# Patient Record
Sex: Female | Born: 1955 | Race: Black or African American | Hispanic: No | Marital: Married | Smoking: Former smoker
Health system: Southern US, Community
[De-identification: ages and names within clinical notes are randomized; demographics above are authoritative.]

## PROBLEM LIST (undated history)

## (undated) DIAGNOSIS — M549 Dorsalgia, unspecified: Secondary | ICD-10-CM

## (undated) DIAGNOSIS — R140 Abdominal distension (gaseous): Secondary | ICD-10-CM

## (undated) DIAGNOSIS — H409 Unspecified glaucoma: Secondary | ICD-10-CM

## (undated) DIAGNOSIS — E049 Nontoxic goiter, unspecified: Secondary | ICD-10-CM

## (undated) DIAGNOSIS — E785 Hyperlipidemia, unspecified: Secondary | ICD-10-CM

## (undated) DIAGNOSIS — G43909 Migraine, unspecified, not intractable, without status migrainosus: Secondary | ICD-10-CM

## (undated) DIAGNOSIS — E119 Type 2 diabetes mellitus without complications: Principal | ICD-10-CM

## (undated) DIAGNOSIS — E039 Hypothyroidism, unspecified: Secondary | ICD-10-CM

## (undated) DIAGNOSIS — K635 Polyp of colon: Secondary | ICD-10-CM

## (undated) DIAGNOSIS — Z8041 Family history of malignant neoplasm of ovary: Secondary | ICD-10-CM

## (undated) DIAGNOSIS — Z800 Family history of malignant neoplasm of digestive organs: Secondary | ICD-10-CM

## (undated) DIAGNOSIS — I10 Essential (primary) hypertension: Secondary | ICD-10-CM

## (undated) DIAGNOSIS — Z8249 Family history of ischemic heart disease and other diseases of the circulatory system: Secondary | ICD-10-CM

## (undated) DIAGNOSIS — G4730 Sleep apnea, unspecified: Secondary | ICD-10-CM

## (undated) DIAGNOSIS — E669 Obesity, unspecified: Secondary | ICD-10-CM

## (undated) DIAGNOSIS — M1990 Unspecified osteoarthritis, unspecified site: Secondary | ICD-10-CM

## (undated) DIAGNOSIS — H8110 Benign paroxysmal vertigo, unspecified ear: Secondary | ICD-10-CM

## (undated) DIAGNOSIS — R232 Flushing: Secondary | ICD-10-CM

## (undated) DIAGNOSIS — C50919 Malignant neoplasm of unspecified site of unspecified female breast: Secondary | ICD-10-CM

## (undated) DIAGNOSIS — I4891 Unspecified atrial fibrillation: Secondary | ICD-10-CM

## (undated) DIAGNOSIS — G4733 Obstructive sleep apnea (adult) (pediatric): Secondary | ICD-10-CM

## (undated) DIAGNOSIS — K7689 Other specified diseases of liver: Secondary | ICD-10-CM

## (undated) DIAGNOSIS — M2550 Pain in unspecified joint: Secondary | ICD-10-CM

## (undated) DIAGNOSIS — R609 Edema, unspecified: Secondary | ICD-10-CM

## (undated) HISTORY — DX: Dorsalgia, unspecified: M54.9

## (undated) HISTORY — DX: Edema, unspecified: R60.9

## (undated) HISTORY — DX: Obstructive sleep apnea (adult) (pediatric): G47.33

## (undated) HISTORY — DX: Family history of malignant neoplasm of ovary: Z80.41

## (undated) HISTORY — DX: Unspecified glaucoma: H40.9

## (undated) HISTORY — DX: Nontoxic goiter, unspecified: E04.9

## (undated) HISTORY — DX: Obesity, unspecified: E66.9

## (undated) HISTORY — DX: Other specified diseases of liver: K76.89

## (undated) HISTORY — DX: Abdominal distension (gaseous): R14.0

## (undated) HISTORY — DX: Hyperlipidemia, unspecified: E78.5

## (undated) HISTORY — DX: Type 2 diabetes mellitus without complications: E11.9

## (undated) HISTORY — DX: Essential (primary) hypertension: I10

## (undated) HISTORY — DX: Pain in unspecified joint: M25.50

## (undated) HISTORY — DX: Family history of ischemic heart disease and other diseases of the circulatory system: Z82.49

## (undated) HISTORY — DX: Unspecified atrial fibrillation: I48.91

## (undated) HISTORY — DX: Family history of malignant neoplasm of digestive organs: Z80.0

## (undated) HISTORY — DX: Migraine, unspecified, not intractable, without status migrainosus: G43.909

## (undated) HISTORY — DX: Flushing: R23.2

## (undated) HISTORY — DX: Polyp of colon: K63.5

## (undated) HISTORY — DX: Malignant neoplasm of unspecified site of unspecified female breast: C50.919

## (undated) HISTORY — DX: Benign paroxysmal vertigo, unspecified ear: H81.10

## (undated) HISTORY — DX: Sleep apnea, unspecified: G47.30

## (undated) HISTORY — PX: BREAST LUMPECTOMY: SHX2

---

## 1998-12-29 ENCOUNTER — Other Ambulatory Visit: Admission: RE | Admit: 1998-12-29 | Discharge: 1998-12-29 | Payer: Self-pay | Admitting: Obstetrics & Gynecology

## 2000-01-08 ENCOUNTER — Other Ambulatory Visit: Admission: RE | Admit: 2000-01-08 | Discharge: 2000-01-08 | Payer: Self-pay | Admitting: Obstetrics & Gynecology

## 2000-02-13 HISTORY — PX: LIVER BIOPSY: SHX301

## 2000-08-07 ENCOUNTER — Encounter: Payer: Self-pay | Admitting: Emergency Medicine

## 2000-08-07 ENCOUNTER — Ambulatory Visit (HOSPITAL_COMMUNITY): Admission: RE | Admit: 2000-08-07 | Discharge: 2000-08-07 | Payer: Self-pay | Admitting: Emergency Medicine

## 2000-08-07 ENCOUNTER — Encounter (INDEPENDENT_AMBULATORY_CARE_PROVIDER_SITE_OTHER): Payer: Self-pay | Admitting: Specialist

## 2000-09-25 ENCOUNTER — Ambulatory Visit (HOSPITAL_COMMUNITY): Admission: RE | Admit: 2000-09-25 | Discharge: 2000-09-25 | Payer: Self-pay | Admitting: Gastroenterology

## 2000-09-25 ENCOUNTER — Encounter: Payer: Self-pay | Admitting: Gastroenterology

## 2000-10-08 ENCOUNTER — Ambulatory Visit (HOSPITAL_COMMUNITY): Admission: RE | Admit: 2000-10-08 | Discharge: 2000-10-08 | Payer: Self-pay | Admitting: Gastroenterology

## 2000-10-08 ENCOUNTER — Encounter: Payer: Self-pay | Admitting: Gastroenterology

## 2000-12-23 ENCOUNTER — Other Ambulatory Visit: Admission: RE | Admit: 2000-12-23 | Discharge: 2000-12-23 | Payer: Self-pay | Admitting: Obstetrics & Gynecology

## 2001-01-23 ENCOUNTER — Encounter: Admission: RE | Admit: 2001-01-23 | Discharge: 2001-01-23 | Payer: Self-pay | Admitting: Obstetrics & Gynecology

## 2001-01-23 ENCOUNTER — Ambulatory Visit (HOSPITAL_COMMUNITY): Admission: RE | Admit: 2001-01-23 | Discharge: 2001-01-23 | Payer: Self-pay | Admitting: Gastroenterology

## 2001-01-23 ENCOUNTER — Encounter: Payer: Self-pay | Admitting: Obstetrics & Gynecology

## 2001-01-23 ENCOUNTER — Encounter: Payer: Self-pay | Admitting: Gastroenterology

## 2001-01-23 ENCOUNTER — Ambulatory Visit (HOSPITAL_COMMUNITY): Admission: RE | Admit: 2001-01-23 | Discharge: 2001-01-23 | Payer: Self-pay | Admitting: *Deleted

## 2001-12-04 ENCOUNTER — Emergency Department (HOSPITAL_COMMUNITY): Admission: EM | Admit: 2001-12-04 | Discharge: 2001-12-04 | Payer: Self-pay | Admitting: *Deleted

## 2002-01-26 ENCOUNTER — Encounter: Admission: RE | Admit: 2002-01-26 | Discharge: 2002-01-26 | Payer: Self-pay | Admitting: Obstetrics & Gynecology

## 2002-01-26 ENCOUNTER — Encounter: Payer: Self-pay | Admitting: Obstetrics & Gynecology

## 2002-02-06 ENCOUNTER — Other Ambulatory Visit: Admission: RE | Admit: 2002-02-06 | Discharge: 2002-02-06 | Payer: Self-pay | Admitting: Obstetrics & Gynecology

## 2002-09-01 ENCOUNTER — Other Ambulatory Visit: Admission: RE | Admit: 2002-09-01 | Discharge: 2002-09-01 | Payer: Self-pay | Admitting: Obstetrics & Gynecology

## 2003-09-27 ENCOUNTER — Other Ambulatory Visit: Admission: RE | Admit: 2003-09-27 | Discharge: 2003-09-27 | Payer: Self-pay | Admitting: Obstetrics & Gynecology

## 2004-10-10 ENCOUNTER — Other Ambulatory Visit: Admission: RE | Admit: 2004-10-10 | Discharge: 2004-10-10 | Payer: Self-pay | Admitting: Obstetrics & Gynecology

## 2005-12-03 ENCOUNTER — Ambulatory Visit: Payer: Self-pay | Admitting: Gastroenterology

## 2005-12-13 DIAGNOSIS — K635 Polyp of colon: Secondary | ICD-10-CM

## 2005-12-13 HISTORY — DX: Polyp of colon: K63.5

## 2005-12-13 LAB — HM COLONOSCOPY

## 2005-12-17 ENCOUNTER — Encounter (INDEPENDENT_AMBULATORY_CARE_PROVIDER_SITE_OTHER): Payer: Self-pay | Admitting: *Deleted

## 2005-12-17 ENCOUNTER — Ambulatory Visit: Payer: Self-pay | Admitting: Gastroenterology

## 2006-10-14 DIAGNOSIS — E049 Nontoxic goiter, unspecified: Secondary | ICD-10-CM

## 2006-10-14 HISTORY — DX: Nontoxic goiter, unspecified: E04.9

## 2006-11-05 ENCOUNTER — Encounter (INDEPENDENT_AMBULATORY_CARE_PROVIDER_SITE_OTHER): Payer: Self-pay | Admitting: Interventional Radiology

## 2006-11-05 ENCOUNTER — Other Ambulatory Visit: Admission: RE | Admit: 2006-11-05 | Discharge: 2006-11-05 | Payer: Self-pay | Admitting: Interventional Radiology

## 2006-11-05 ENCOUNTER — Encounter: Admission: RE | Admit: 2006-11-05 | Discharge: 2006-11-05 | Payer: Self-pay | Admitting: Internal Medicine

## 2008-06-24 ENCOUNTER — Encounter: Admission: RE | Admit: 2008-06-24 | Discharge: 2008-06-24 | Payer: Self-pay | Admitting: Endocrinology

## 2009-08-04 ENCOUNTER — Encounter: Admission: RE | Admit: 2009-08-04 | Discharge: 2009-08-04 | Payer: Self-pay | Admitting: Emergency Medicine

## 2010-03-05 ENCOUNTER — Encounter: Payer: Self-pay | Admitting: Endocrinology

## 2010-03-07 ENCOUNTER — Other Ambulatory Visit: Payer: Self-pay | Admitting: Endocrinology

## 2010-03-07 DIAGNOSIS — E049 Nontoxic goiter, unspecified: Secondary | ICD-10-CM

## 2010-04-20 ENCOUNTER — Other Ambulatory Visit: Payer: Self-pay | Admitting: Obstetrics & Gynecology

## 2010-05-29 ENCOUNTER — Other Ambulatory Visit: Payer: Self-pay

## 2010-06-13 DIAGNOSIS — E119 Type 2 diabetes mellitus without complications: Secondary | ICD-10-CM | POA: Insufficient documentation

## 2010-06-13 HISTORY — DX: Type 2 diabetes mellitus without complications: E11.9

## 2010-09-06 ENCOUNTER — Ambulatory Visit
Admission: RE | Admit: 2010-09-06 | Discharge: 2010-09-06 | Disposition: A | Discharge status: Home / Self Care | Payer: 59 | Source: Ambulatory Visit | Attending: Endocrinology | Admitting: Endocrinology

## 2010-09-06 DIAGNOSIS — E049 Nontoxic goiter, unspecified: Secondary | ICD-10-CM

## 2010-10-10 LAB — BASIC METABOLIC PANEL: Glucose: 127 mg/dL

## 2010-10-10 LAB — HM DIABETES FOOT EXAM: HM Diabetic Foot Exam: NORMAL

## 2011-01-09 LAB — HEMOGLOBIN A1C: Hgb A1c MFr Bld: 6.7 % — AB (ref 4.0–6.0)

## 2011-01-10 LAB — LIPID PANEL
Cholesterol: 227 mg/dL — AB (ref 0–200)
HDL: 52 mg/dL (ref 35–70)
LDL Cholesterol: 101 mg/dL
LDl/HDL Ratio: 4.4
Triglycerides: 368 mg/dL — AB (ref 40–160)

## 2011-04-02 ENCOUNTER — Other Ambulatory Visit: Payer: Self-pay | Admitting: Emergency Medicine

## 2011-04-02 ENCOUNTER — Other Ambulatory Visit: Payer: Self-pay | Admitting: Physician Assistant

## 2011-04-05 ENCOUNTER — Encounter: Payer: Self-pay | Admitting: *Deleted

## 2011-04-05 DIAGNOSIS — E1169 Type 2 diabetes mellitus with other specified complication: Secondary | ICD-10-CM | POA: Insufficient documentation

## 2011-04-05 DIAGNOSIS — E119 Type 2 diabetes mellitus without complications: Secondary | ICD-10-CM

## 2011-04-05 DIAGNOSIS — E785 Hyperlipidemia, unspecified: Secondary | ICD-10-CM

## 2011-04-05 DIAGNOSIS — D649 Anemia, unspecified: Secondary | ICD-10-CM | POA: Insufficient documentation

## 2011-04-05 DIAGNOSIS — K635 Polyp of colon: Secondary | ICD-10-CM

## 2011-04-05 DIAGNOSIS — E049 Nontoxic goiter, unspecified: Secondary | ICD-10-CM

## 2011-04-05 DIAGNOSIS — G43909 Migraine, unspecified, not intractable, without status migrainosus: Secondary | ICD-10-CM | POA: Insufficient documentation

## 2011-04-05 DIAGNOSIS — I10 Essential (primary) hypertension: Secondary | ICD-10-CM | POA: Insufficient documentation

## 2011-04-05 DIAGNOSIS — H8110 Benign paroxysmal vertigo, unspecified ear: Secondary | ICD-10-CM | POA: Insufficient documentation

## 2011-04-17 ENCOUNTER — Encounter: Payer: Self-pay | Admitting: Emergency Medicine

## 2011-04-17 ENCOUNTER — Ambulatory Visit (INDEPENDENT_AMBULATORY_CARE_PROVIDER_SITE_OTHER): Payer: 59 | Admitting: Emergency Medicine

## 2011-04-17 DIAGNOSIS — E785 Hyperlipidemia, unspecified: Secondary | ICD-10-CM

## 2011-04-17 DIAGNOSIS — E782 Mixed hyperlipidemia: Secondary | ICD-10-CM

## 2011-04-17 DIAGNOSIS — E119 Type 2 diabetes mellitus without complications: Secondary | ICD-10-CM

## 2011-04-17 LAB — GLUCOSE, POCT (MANUAL RESULT ENTRY): POC Glucose: 100

## 2011-04-17 LAB — POCT GLYCOSYLATED HEMOGLOBIN (HGB A1C): Hemoglobin A1C: 6.6

## 2011-04-17 MED ORDER — METFORMIN HCL ER 500 MG PO TB24: ORAL_TABLET | ORAL | Status: DC

## 2011-04-17 NOTE — Progress Notes (Signed)
  Subjective:    Patient ID: Jessica Mendoza, female    DOB: [birth year], 56 y.o.   MRN: [id number]  HPI patient here to followup on her diabetes. She overall is doing well. She says she has lost weight. She is exercising on a regular basis. She's had some swelling in her lower extremities but no other symptoms.    Review of Systems  Constitutional: Negative.   HENT: Negative.   Eyes: Negative.   Respiratory: Negative.   Cardiovascular: Negative.   Gastrointestinal:       When she takes her metformin she has some GI discomfort.       Objective:   Physical Exam  Constitutional: She appears well-developed and well-nourished.  HENT:  Head: Normocephalic.  Eyes: Pupils are equal, round, and reactive to light.  Neck: No JVD present. No tracheal deviation present. Thyromegaly present.  Cardiovascular: Normal rate, regular rhythm and normal heart sounds.   Pulmonary/Chest: Effort normal and breath sounds normal. She has no wheezes. She has no rales. She exhibits no tenderness.  Abdominal: Soft. There is no tenderness.  Musculoskeletal: She exhibits edema.  Lymphadenopathy:    She has no cervical adenopathy.          Assessment & Plan:  Assessment is diabetes not cholesterol. She is having some GI symptoms related to her metformin. We'll consider changing her to the extended release form of metformin and see if this is easier on her stomach.

## 2011-04-18 ENCOUNTER — Encounter: Payer: Self-pay | Admitting: Family Medicine

## 2011-04-18 LAB — LIPID PANEL
Cholesterol: 187 mg/dL (ref 0–200)
HDL: 47 mg/dL (ref >39)
LDL Cholesterol: 110 mg/dL — ABNORMAL HIGH (ref 0–99)
Total CHOL/HDL Ratio: 4 {ratio}
Triglycerides: 150 mg/dL — ABNORMAL HIGH (ref <150)
VLDL: 30 mg/dL (ref 0–40)

## 2011-04-19 ENCOUNTER — Encounter: Payer: Self-pay | Admitting: Radiology

## 2011-04-24 ENCOUNTER — Telehealth: Payer: Self-pay

## 2011-04-24 NOTE — Telephone Encounter (Signed)
.  [facility] PT HAD LAB WORK DONE AND WOULD LIKE TO KNOW RESULTS. PLEASE CALL [phone]

## 2011-04-25 NOTE — Telephone Encounter (Signed)
Patient notified labs.

## 2011-04-25 NOTE — Telephone Encounter (Signed)
See labs. LMOM to CB 

## 2011-05-02 ENCOUNTER — Other Ambulatory Visit: Payer: Self-pay | Admitting: Radiology

## 2011-05-21 ENCOUNTER — Ambulatory Visit (INDEPENDENT_AMBULATORY_CARE_PROVIDER_SITE_OTHER): Payer: 59 | Admitting: Surgery

## 2011-05-21 ENCOUNTER — Encounter (INDEPENDENT_AMBULATORY_CARE_PROVIDER_SITE_OTHER): Payer: Self-pay | Admitting: Surgery

## 2011-05-21 VITALS — BP 122/76 | Ht 65.0 in | Wt 214.4 lb

## 2011-05-21 DIAGNOSIS — N6089 Other benign mammary dysplasias of unspecified breast: Secondary | ICD-10-CM

## 2011-05-21 DIAGNOSIS — N6099 Unspecified benign mammary dysplasia of unspecified breast: Secondary | ICD-10-CM | POA: Insufficient documentation

## 2011-05-21 NOTE — Patient Instructions (Signed)
Lumpectomy, Breast Conserving Surgery Care After Please read the instructions outlined below and refer to this sheet in the next few weeks. These discharge instructions provide you with general information on caring for yourself after you leave the hospital. Your surgeon may also give you specific instructions. While your treatment has been planned according to the most current medical practices available, unavoidable complications occasionally occur. If you have any problems or questions after discharge, please call your surgeon. Reasons for a lumpectomy: Any solid breast mass.  Grouped significant nodularity that may be confused with a solitary breast mass.  AFTER THE PROCEDURE After surgery, you will be taken to the recovery area where a nurse will watch and check your progress. Once you're awake, stable, and taking fluids well, barring other problems you will be allowed to go home.  Ice packs applied to your operative site may help with discomfort and keep the swelling down.  A small rubber drain may be placed in the incision for a couple of days to prevent a hematoma in the breast.  A pressure dressing may be applied for 24 to 48 hours to prevent bleeding.  Keep the wound dry.  You may resume a normal diet and activities as directed. Avoid strenuous activities affecting the arm on the side of the biopsy site such as tennis, swimming, heavy lifting (more than 10 pounds) or pulling.  Bruising in the breast is normal following this procedure.  Wearing a bra - even to bed - may be more comfortable and also help keep the dressing on.  Change dressings as directed.  Only take over-the-counter or prescription medicines for pain, discomfort, or fever as directed by your caregiver.  Call for your results as instructed by your surgeon. Remember it isyour responsibility to get the results of your lumpectomy if your surgeon asked you to follow-up. Do not assume everything is fine if you have not heard from  your caregiver. SEEK MEDICAL CARE IF:  There is increased bleeding (more than a small spot) from the wound.  You notice redness, swelling, or increasing pain in the wound.  Pus is coming from wound.  An unexplained oral temperature above 102 F (38.9 C) develops.  You notice a foul smell coming from the wound or dressing.  SEEK IMMEDIATE MEDICAL CARE IF:  You develop a rash.  You have difficulty breathing.  You have any allergic problems.  Document Released: 02/14/2006 Document Revised: 01/18/2011 Document Reviewed: 01/17/2007 Exit[employer] Patient Information 2012 [employer].Lumpectomy, Breast Conserving Surgery A lumpectomy is breast surgery that removes only part of the breast. Another name used may be partial mastectomy. The amount removed varies. Make sure you understand how much of your breast will be removed. Reasons for a lumpectomy:  Any solid breast mass.   Grouped significant nodularity that may be confused with a solitary breast mass.  Lumpectomy is the most common form of breast cancer surgery today. The surgeon removes the portion of your breast which contains the tumor (cancer). This is the lump. Some normal tissue around the lump is also removed to be sure that all the tumor has been removed.  If cancer cells are found in the margins where the breast tissue was removed, your surgeon will do more surgery to remove the remaining cancer tissue. This is called re-excision surgery. Radiation and/or chemotherapy treatments are often given following a lumpectomy to kill any cancer cells that could possibly remain.  REASONS YOU MAY NOT BE ABLE TO HAVE BREAST CONSERVING SURGERY:  The   tumor is located in more than one place.   Your breast is small and the tumor is large so the breast would be disfigured.   The entire tumor removal is not successful with a lumpectomy.   You cannot commit to a full course of chemotherapy, radiation therapy or are pregnant and cannot have radiation.     You have previously had radiation to the breast to treat cancer.  HOW A LUMPECTOMY IS PERFORMED If overnight nursing is not required following a biopsy, a lumpectomy can be performed as a same-day surgery. This can be done in a hospital, clinic, or surgical center. The anesthesia used will depend on your surgeon. They will discuss this with you. A general anesthetic keeps you sleeping through the procedure. LET YOUR CAREGIVERS KNOW ABOUT THE FOLLOWING:  Allergies   Medications taken including herbs, eye drops, over the counter medications, and creams.   Use of steroids (by mouth or creams)   Previous problems with anesthetics or Novocaine.   Possibility of pregnancy, if this applies   History of blood clots (thrombophlebitis)   History of bleeding or blood problems.   Previous surgery   Other health problems  BEFORE THE PROCEDURE You should be present one hour prior to your procedure unless directed otherwise.  AFTER THE PROCEDURE  After surgery, you will be taken to the recovery area where a nurse will watch and check your progress. Once you're awake, stable, and taking fluids well, barring other problems you will be allowed to go home.   Ice packs applied to your operative site may help with discomfort and keep the swelling down.   A small rubber drain may be placed in the breast for a couple of days to prevent a hematoma from developing in the breast.   A pressure dressing may be applied for 24 to 48 hours to prevent bleeding.   Keep the wound dry.   You may resume a normal diet and activities as directed. Avoid strenuous activities affecting the arm on the side of the biopsy site such as tennis, swimming, heavy lifting (more than 10 pounds) or pulling.   Bruising in the breast is normal following this procedure.   Wearing a bra - even to bed - may be more comfortable and also help keep the dressing on.   Change dressings as directed.   Only take over-the-counter or  prescription medicines for pain, discomfort, or fever as directed by your caregiver.  Call for your results as instructed by your surgeon. Remember it is your responsibility to get the results of your lumpectomy if your surgeon asked you to follow-up. Do not assume everything is fine if you have not heard from your caregiver. SEEK MEDICAL CARE IF:   There is increased bleeding (more than a small spot) from the wound.   You notice redness, swelling, or increasing pain in the wound.   Pus is coming from wound.   An unexplained oral temperature above 102 F (38.9 C) develops.   You notice a foul smell coming from the wound or dressing.  SEEK IMMEDIATE MEDICAL CARE IF:   You develop a rash.   You have difficulty breathing.   You have any allergic problems.  Document Released: 03/12/2006 Document Revised: 01/18/2011 Document Reviewed: 06/13/2006 Exit[employer] Patient Information 2012 [employer]. 

## 2011-05-21 NOTE — Progress Notes (Signed)
Patient ID: Jessica Mendoza, female   DOB: 1955/09/21, 56 y.o.   MRN: [id number]  No chief complaint on file.   HPI Jessica Mendoza is a 56 y.o. female.   HPIPatient sent at the request of Dr. Tilda Burrow do 2 calcifications noted on recent screening mammogram on the right. 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 She denies any history of breast mass, nipple discharge or change in the appearance of either breast. She does have mild breast pain bilaterally. This is not severe. This is been present for many years.  Past Medical History  Diagnosis Date  . Diabetes mellitus, type 2 06/2010  . Essential hypertension, benign   . Other and unspecified hyperlipidemia   . Migraines     menstrual  . Colon polyps 12/2005  . Anemia     iron def  . Focal nodular hyperplasia of liver     bx 2002  . Benign positional vertigo   . Goiter 10/2006    Past Surgical History  Procedure Date  . Liver biopsy 2002    Family History  Problem Relation Age of Onset  . Hypertension Mother   . Alcohol abuse Father   . Hypertension Brother   . Hyperlipidemia Brother   . Ovarian cancer Maternal Aunt   . Pancreatic cancer Maternal Grandfather   . Heart attack Mother     Social History History  Substance Use Topics  . Smoking status: Former Games developer  . Smokeless tobacco: Not on file  . Alcohol Use: Yes     occas    Allergies  Allergen Reactions  . Simvastatin     Myalgias  ?? lipitor     Current Outpatient Prescriptions  Medication Sig Dispense Refill  . amLODipine (NORVASC) 10 MG tablet Take 10 mg by mouth daily.      Marland Kitchen aspirin 81 MG tablet Take 81 mg by mouth daily.      . Coenzyme Q10 (CO Q 10) 100 MG CAPS Take by mouth.      . Fish Oil-Cholecalciferol (FISH OIL + D3 PO) Take by mouth.      . levothyroxine (SYNTHROID, LEVOTHROID) 25 MCG tablet Take 25 mcg by mouth daily.      Marland Kitchen losartan-hydrochlorothiazide (HYZAAR) 100-12.5 MG  per tablet Take 1 tablet by mouth daily.      . metFORMIN (GLUCOPHAGE XR) 500 MG 24 hr tablet Take 2 tablets daily  60 tablet  12  . metoprolol succinate (TOPROL-XL) 100 MG 24 hr tablet Take 100 mg by mouth daily. Take with or immediately following a meal.      . rosuvastatin (CRESTOR) 10 MG tablet Take 10 mg by mouth daily.      . vitamin C (ASCORBIC ACID) 500 MG tablet Take 500 mg by mouth daily.        Review of Systems Review of Systems  Constitutional: Negative for fever, chills and unexpected weight change.  HENT: Negative for hearing loss, congestion, sore throat, trouble swallowing and voice change.   Eyes: Negative for visual disturbance.  Respiratory: Negative for cough and wheezing.   Cardiovascular: Negative for chest pain, palpitations and leg swelling.  Gastrointestinal: Negative for nausea, vomiting, abdominal pain, diarrhea, constipation, blood in stool, abdominal distention and anal bleeding.  Genitourinary: Negative for hematuria, vaginal bleeding and difficulty urinating.  Musculoskeletal: Negative for arthralgias.  Skin: Negative for rash and wound.  Neurological: Negative for seizures, syncope and headaches.  Hematological: Negative for adenopathy.  Does not bruise/bleed easily.  Psychiatric/Behavioral: Negative for confusion.    Blood pressure 122/76, height 5\' 5"  (1.651 m), weight 214 lb 6.4 oz (97.251 kg).  Physical Exam Physical Exam  Constitutional: She appears well-developed and well-nourished.  HENT:  Head: Normocephalic and atraumatic.  Eyes: EOM are normal. Pupils are equal, round, and reactive to light.  Neck: Normal range of motion. Neck supple.  Cardiovascular: Normal rate and regular rhythm.   Pulmonary/Chest: Effort normal and breath sounds normal.       Right breast status post biopsy changes. No hematoma or mass noted. Right axilla normal. Left breast there is no mass lesion. Both nipples normal. Left axilla normal.  Neurological: She is alert.    Skin: Skin is warm and dry.  Psychiatric: She has a normal mood and affect. Her behavior is normal. Judgment and thought content normal.    Data Reviewed Mammograms from [facility]  cluster right breast microcalcifications upper-outer quadrant core biopsy shows atypical ductal hyperplasia. Left breast normal.  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 Pros and cons of each were discussed. Risks of each were discussed. She was to proceed with right breast needle localized lumpectomy.The procedure has been discussed with the patient. Alternatives to surgery have been discussed with the patient.  Risks of surgery include bleeding,  Infection,  Seroma formation, death,  and the need for further surgery.   The patient understands and wishes to proceed.       Alec Mcphee A. 05/21/2011, 3:24 PM

## 2011-06-15 ENCOUNTER — Ambulatory Visit (INDEPENDENT_AMBULATORY_CARE_PROVIDER_SITE_OTHER): Payer: 59 | Admitting: Surgery

## 2011-06-15 ENCOUNTER — Encounter (INDEPENDENT_AMBULATORY_CARE_PROVIDER_SITE_OTHER): Payer: Self-pay | Admitting: Surgery

## 2011-06-15 VITALS — BP 120/74 | HR 78 | Temp 97.3°F | Resp 16 | Ht 65.0 in | Wt 214.0 lb

## 2011-06-15 DIAGNOSIS — N63 Unspecified lump in unspecified breast: Secondary | ICD-10-CM

## 2011-06-15 DIAGNOSIS — N6310 Unspecified lump in the right breast, unspecified quadrant: Secondary | ICD-10-CM

## 2011-06-15 NOTE — Patient Instructions (Signed)
See you Friday.

## 2011-06-15 NOTE — Progress Notes (Signed)
Patient ID: Jessica Mendoza, female   DOB: 02/15/1955, 55 y.o.   MRN: [id number]  Chief Complaint  Patient presents with  . Pre-op Exam    Right breast sx    HPI Jessica Mendoza is a 55 y.o. female.   HPIPatient sent at the request of Dr. Cornella do 2 calcifications noted on recent screening mammogram on the right. 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 She denies any history of breast mass, nipple discharge or change in the appearance of either breast. She does have mild breast pain bilaterally. This is not severe. This is been present for many years.  Past Medical History  Diagnosis Date  . Diabetes mellitus, type 2 06/2010  . Essential hypertension, benign   . Other and unspecified hyperlipidemia   . Migraines     menstrual  . Colon polyps 12/2005  . Anemia     iron def  . Focal nodular hyperplasia of liver     bx 2002  . Benign positional vertigo   . Goiter 10/2006    Past Surgical History  Procedure Date  . Liver biopsy 2002    Family History  Problem Relation Age of Onset  . Hypertension Mother   . Heart attack Mother   . Alcohol abuse Father   . Hypertension Brother   . Hyperlipidemia Brother   . Ovarian cancer Maternal Aunt   . Cancer Maternal Aunt     Uterine cancer  . Pancreatic cancer Maternal Grandfather   . Cancer Maternal Grandfather     Pancreatic    Social History History  Substance Use Topics  . Smoking status: Former Smoker -- 25 years    Quit date: 02/13/1991  . Smokeless tobacco: Not on file  . Alcohol Use: Yes     occas    Allergies  Allergen Reactions  . Simvastatin     Myalgias  ?? lipitor     Current Outpatient Prescriptions  Medication Sig Dispense Refill  . amLODipine (NORVASC) 10 MG tablet Take 10 mg by mouth daily.      . aspirin 81 MG tablet Take 81 mg by mouth daily.      . Coenzyme Q10 (CO Q 10) 100 MG CAPS Take by mouth.      . Fish Oil-Cholecalciferol  (FISH OIL + D3 PO) Take by mouth.      . levothyroxine (SYNTHROID, LEVOTHROID) 25 MCG tablet Take 25 mcg by mouth daily.      . losartan-hydrochlorothiazide (HYZAAR) 100-12.5 MG per tablet Take 1 tablet by mouth daily.      . metFORMIN (GLUCOPHAGE XR) 500 MG 24 hr tablet Take 2 tablets daily  60 tablet  12  . metoprolol succinate (TOPROL-XL) 100 MG 24 hr tablet Take 100 mg by mouth daily. Take with or immediately following a meal.      . rosuvastatin (CRESTOR) 10 MG tablet Take 10 mg by mouth daily.      . vitamin C (ASCORBIC ACID) 500 MG tablet Take 500 mg by mouth daily.        Review of Systems Review of Systems  Constitutional: Negative for fever, chills and unexpected weight change.  HENT: Negative for hearing loss, congestion, sore throat, trouble swallowing and voice change.   Eyes: Negative for visual disturbance.  Respiratory: Negative for cough and wheezing.   Cardiovascular: Negative for chest pain, palpitations and leg swelling.  Gastrointestinal: Negative for nausea, vomiting, abdominal pain, diarrhea, constipation, blood   in stool, abdominal distention and anal bleeding.  Genitourinary: Negative for hematuria, vaginal bleeding and difficulty urinating.  Musculoskeletal: Negative for arthralgias.  Skin: Negative for rash and wound.  Neurological: Negative for seizures, syncope and headaches.  Hematological: Negative for adenopathy. Does not bruise/bleed easily.  Psychiatric/Behavioral: Negative for confusion.    Blood pressure 120/74, pulse 78, temperature 97.3 F (36.3 C), temperature source Temporal, resp. rate 16, height 5' 5" (1.651 m), weight 214 lb (97.07 kg).  Physical Exam Physical Exam  Constitutional: She appears well-developed and well-nourished.  HENT:  Head: Normocephalic and atraumatic.  Eyes: EOM are normal. Pupils are equal, round, and reactive to light.  Neck: Normal range of motion. Neck supple.  Cardiovascular: Normal rate and regular rhythm.     Pulmonary/Chest: Effort normal and breath sounds normal.       Right breast status post biopsy changes. No hematoma or mass noted. Right axilla normal. Left breast there is no mass lesion. Both nipples normal. Left axilla normal.  Neurological: She is alert.  Skin: Skin is warm and dry.  Psychiatric: She has a normal mood and affect. Her behavior is normal. Judgment and thought content normal.    Data Reviewed Mammograms from [facility]  cluster right breast microcalcifications upper-outer quadrant core biopsy shows atypical ductal hyperplasia. Left breast normal.  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 Pros and cons of each were discussed. Risks of each were discussed. She was to proceed with right breast needle localized lumpectomy.The procedure has been discussed with the patient. Alternatives to surgery have been discussed with the patient.  Risks of surgery include bleeding,  Infection,  Seroma formation, death,  and the need for further surgery.   The patient understands and wishes to proceed.       Meilin Brosh A. 06/15/2011, 12:26 PM    

## 2011-06-19 ENCOUNTER — Encounter (HOSPITAL_BASED_OUTPATIENT_CLINIC_OR_DEPARTMENT_OTHER): Payer: Self-pay | Admitting: *Deleted

## 2011-06-20 ENCOUNTER — Ambulatory Visit
Admission: RE | Admit: 2011-06-20 | Discharge: 2011-06-20 | Disposition: A | Discharge status: Home / Self Care | Payer: 59 | Source: Ambulatory Visit | Attending: Surgery | Admitting: Surgery

## 2011-06-20 ENCOUNTER — Encounter (HOSPITAL_BASED_OUTPATIENT_CLINIC_OR_DEPARTMENT_OTHER)
Admission: RE | Admit: 2011-06-20 | Discharge: 2011-06-20 | Disposition: A | Discharge status: Home / Self Care | Payer: 59 | Source: Ambulatory Visit | Attending: Surgery | Admitting: Surgery

## 2011-06-20 LAB — DIFFERENTIAL
Basophils Absolute: 0 10*3/uL (ref 0.0–0.1)
Basophils Relative: 0 % (ref 0–1)
Eosinophils Absolute: 0.2 10*3/uL (ref 0.0–0.7)
Eosinophils Relative: 4 % (ref 0–5)
Lymphocytes Relative: 49 % — ABNORMAL HIGH (ref 12–46)
Lymphs Abs: 2.4 10*3/uL (ref 0.7–4.0)
Monocytes Absolute: 0.4 10*3/uL (ref 0.1–1.0)
Monocytes Relative: 7 % (ref 3–12)
Neutro Abs: 2 10*3/uL (ref 1.7–7.7)
Neutrophils Relative %: 40 % — ABNORMAL LOW (ref 43–77)

## 2011-06-20 LAB — COMPREHENSIVE METABOLIC PANEL
ALT: 34 U/L (ref 0–35)
AST: 33 U/L (ref 0–37)
Albumin: 3.7 g/dL (ref 3.5–5.2)
Alkaline Phosphatase: 93 U/L (ref 39–117)
BUN: 11 mg/dL (ref 6–23)
CO2: 27 mEq/L (ref 19–32)
Calcium: 9.1 mg/dL (ref 8.4–10.5)
Chloride: 101 mEq/L (ref 96–112)
Creatinine, Ser: 0.81 mg/dL (ref 0.50–1.10)
GFR calc Af Amer: >90 mL/min (ref >90)
GFR calc non Af Amer: 80 mL/min — ABNORMAL LOW (ref >90)
Glucose, Bld: 129 mg/dL — ABNORMAL HIGH (ref 70–99)
Potassium: 3.5 meq/L (ref 3.5–5.1)
Sodium: 139 meq/L (ref 135–145)
Total Bilirubin: 0.2 mg/dL — ABNORMAL LOW (ref 0.3–1.2)
Total Protein: 7.3 g/dL (ref 6.0–8.3)

## 2011-06-20 LAB — CBC
HCT: 37 % (ref 36.0–46.0)
Hemoglobin: 12.5 g/dL (ref 12.0–15.0)
MCH: 27.5 pg (ref 26.0–34.0)
MCHC: 33.8 g/dL (ref 30.0–36.0)
MCV: 81.3 fL (ref 78.0–100.0)
Platelets: 231 10*3/uL (ref 150–400)
RBC: 4.55 MIL/uL (ref 3.87–5.11)
RDW: 14.7 % (ref 11.5–15.5)
WBC: 4.9 10*3/uL (ref 4.0–10.5)

## 2011-06-22 ENCOUNTER — Encounter (HOSPITAL_BASED_OUTPATIENT_CLINIC_OR_DEPARTMENT_OTHER)
Admission: RE | Disposition: A | Discharge status: Home / Self Care | Payer: Self-pay | Source: Ambulatory Visit | Attending: Surgery

## 2011-06-22 ENCOUNTER — Ambulatory Visit (HOSPITAL_BASED_OUTPATIENT_CLINIC_OR_DEPARTMENT_OTHER): Payer: 59 | Admitting: Anesthesiology

## 2011-06-22 ENCOUNTER — Ambulatory Visit (HOSPITAL_BASED_OUTPATIENT_CLINIC_OR_DEPARTMENT_OTHER)
Admission: RE | Admit: 2011-06-22 | Discharge: 2011-06-22 | Disposition: A | Discharge status: Home / Self Care | Payer: 59 | Source: Ambulatory Visit | Attending: Surgery | Admitting: Surgery

## 2011-06-22 ENCOUNTER — Encounter (HOSPITAL_BASED_OUTPATIENT_CLINIC_OR_DEPARTMENT_OTHER): Payer: Self-pay | Admitting: *Deleted

## 2011-06-22 ENCOUNTER — Encounter (HOSPITAL_BASED_OUTPATIENT_CLINIC_OR_DEPARTMENT_OTHER): Payer: Self-pay | Admitting: Anesthesiology

## 2011-06-22 DIAGNOSIS — E119 Type 2 diabetes mellitus without complications: Secondary | ICD-10-CM

## 2011-06-22 DIAGNOSIS — D649 Anemia, unspecified: Secondary | ICD-10-CM

## 2011-06-22 DIAGNOSIS — R51 Headache: Secondary | ICD-10-CM | POA: Insufficient documentation

## 2011-06-22 DIAGNOSIS — E049 Nontoxic goiter, unspecified: Secondary | ICD-10-CM

## 2011-06-22 DIAGNOSIS — H8110 Benign paroxysmal vertigo, unspecified ear: Secondary | ICD-10-CM

## 2011-06-22 DIAGNOSIS — N6099 Unspecified benign mammary dysplasia of unspecified breast: Secondary | ICD-10-CM

## 2011-06-22 DIAGNOSIS — N6089 Other benign mammary dysplasias of unspecified breast: Secondary | ICD-10-CM | POA: Insufficient documentation

## 2011-06-22 DIAGNOSIS — E785 Hyperlipidemia, unspecified: Secondary | ICD-10-CM

## 2011-06-22 DIAGNOSIS — Z01812 Encounter for preprocedural laboratory examination: Secondary | ICD-10-CM | POA: Insufficient documentation

## 2011-06-22 DIAGNOSIS — G43909 Migraine, unspecified, not intractable, without status migrainosus: Secondary | ICD-10-CM

## 2011-06-22 DIAGNOSIS — Z01810 Encounter for preprocedural cardiovascular examination: Secondary | ICD-10-CM | POA: Insufficient documentation

## 2011-06-22 DIAGNOSIS — K635 Polyp of colon: Secondary | ICD-10-CM

## 2011-06-22 DIAGNOSIS — I10 Essential (primary) hypertension: Secondary | ICD-10-CM

## 2011-06-22 DIAGNOSIS — N6029 Fibroadenosis of unspecified breast: Secondary | ICD-10-CM | POA: Insufficient documentation

## 2011-06-22 HISTORY — DX: Hypothyroidism, unspecified: E03.9

## 2011-06-22 HISTORY — DX: Unspecified osteoarthritis, unspecified site: M19.90

## 2011-06-22 LAB — GLUCOSE, CAPILLARY
Glucose-Capillary: 93 mg/dL (ref 70–99)
Glucose-Capillary: 85 mg/dL (ref 70–99)

## 2011-06-22 SURGERY — BREAST LUMPECTOMY WITH NEEDLE LOCALIZATION
Anesthesia: General | Site: Breast | Laterality: Right | Wound class: Clean

## 2011-06-22 MED ORDER — CEFAZOLIN SODIUM-DEXTROSE 2-3 GM-% IV SOLR: 2.0000 g | INTRAVENOUS | Status: DC

## 2011-06-22 MED ORDER — HYDROCODONE-ACETAMINOPHEN 5-500 MG PO TABS: 1.0000 | ORAL_TABLET | Freq: Four times a day (QID) | ORAL | Status: DC | PRN

## 2011-06-22 MED ORDER — SCOPOLAMINE 1 MG/3DAYS TD PT72
1.0000 | MEDICATED_PATCH | Freq: Once | TRANSDERMAL | Status: DC
  Administered 2011-06-22: 1.5 mg via TRANSDERMAL

## 2011-06-22 MED ORDER — MIDAZOLAM HCL 5 MG/5ML IJ SOLN
INTRAMUSCULAR | Status: DC | PRN
  Administered 2011-06-22: 2 mg via INTRAVENOUS

## 2011-06-22 MED ORDER — FENTANYL CITRATE 0.05 MG/ML IJ SOLN
INTRAMUSCULAR | Status: DC | PRN
  Administered 2011-06-22: 100 ug via INTRAVENOUS

## 2011-06-22 MED ORDER — BUPIVACAINE-EPINEPHRINE 0.25% -1:200000 IJ SOLN
INTRAMUSCULAR | Status: DC | PRN
  Administered 2011-06-22: 10 mL

## 2011-06-22 MED ORDER — FENTANYL CITRATE 0.05 MG/ML IJ SOLN: 50.0000 ug | INTRAMUSCULAR | Status: DC | PRN

## 2011-06-22 MED ORDER — PROPOFOL 10 MG/ML IV EMUL
INTRAVENOUS | Status: DC | PRN
  Administered 2011-06-22: 200 mg via INTRAVENOUS

## 2011-06-22 MED ORDER — LIDOCAINE HCL (CARDIAC) 20 MG/ML IV SOLN
INTRAVENOUS | Status: DC | PRN
  Administered 2011-06-22: 100 mg via INTRAVENOUS

## 2011-06-22 MED ORDER — EPHEDRINE SULFATE 50 MG/ML IJ SOLN
INTRAMUSCULAR | Status: DC | PRN
  Administered 2011-06-22 (×2): 10 mg via INTRAVENOUS

## 2011-06-22 MED ORDER — LACTATED RINGERS IV SOLN
INTRAVENOUS | Status: DC
  Administered 2011-06-22 (×2): via INTRAVENOUS

## 2011-06-22 MED ORDER — LORAZEPAM 2 MG/ML IJ SOLN: 1.0000 mg | Freq: Once | INTRAMUSCULAR | Status: DC | PRN

## 2011-06-22 MED ORDER — HYDROMORPHONE HCL PF 1 MG/ML IJ SOLN: 0.2500 mg | INTRAMUSCULAR | Status: DC | PRN

## 2011-06-22 MED ORDER — CHLORHEXIDINE GLUCONATE 4 % EX LIQD: 1.0000 "application " | Freq: Once | CUTANEOUS | Status: DC

## 2011-06-22 MED ORDER — MIDAZOLAM HCL 2 MG/2ML IJ SOLN: 1.0000 mg | INTRAMUSCULAR | Status: DC | PRN

## 2011-06-22 MED ORDER — ONDANSETRON HCL 4 MG/2ML IJ SOLN
INTRAMUSCULAR | Status: DC | PRN
  Administered 2011-06-22: 4 mg via INTRAVENOUS

## 2011-06-22 SURGICAL SUPPLY — 43 items
ADH SKN CLS APL DERMABOND .7 (GAUZE/BANDAGES/DRESSINGS) ×1
BLADE SURG 15 STRL LF DISP TIS (BLADE) ×1 IMPLANT
BLADE SURG 15 STRL SS (BLADE) ×2
CANISTER SUCTION 1200CC (MISCELLANEOUS) ×2 IMPLANT
CHLORAPREP W/TINT 26ML (MISCELLANEOUS) ×2 IMPLANT
CLIP TI WIDE RED SMALL 6 (CLIP) IMPLANT
CLOTH BEACON ORANGE TIMEOUT ST (SAFETY) ×2 IMPLANT
COVER MAYO STAND STRL (DRAPES) ×2 IMPLANT
COVER TABLE BACK 60X90 (DRAPES) ×2 IMPLANT
DECANTER SPIKE VIAL GLASS SM (MISCELLANEOUS) ×2 IMPLANT
DERMABOND ADVANCED (GAUZE/BANDAGES/DRESSINGS) ×1
DERMABOND ADVANCED .7 DNX12 (GAUZE/BANDAGES/DRESSINGS) ×1 IMPLANT
DEVICE DUBIN W/COMP PLATE 8390 (MISCELLANEOUS) ×2 IMPLANT
DRAPE LAPAROSCOPIC ABDOMINAL (DRAPES) IMPLANT
DRAPE PED LAPAROTOMY (DRAPES) ×2 IMPLANT
DRAPE UTILITY XL STRL (DRAPES) ×2 IMPLANT
ELECT COATED BLADE 2.86 ST (ELECTRODE) ×2 IMPLANT
ELECT REM PT RETURN 9FT ADLT (ELECTROSURGICAL) ×2
ELECTRODE REM PT RTRN 9FT ADLT (ELECTROSURGICAL) ×1 IMPLANT
GLOVE BIO SURGEON STRL SZ 6.5 (GLOVE) ×2 IMPLANT
GLOVE BIOGEL PI IND STRL 7.0 (GLOVE) ×1 IMPLANT
GLOVE BIOGEL PI IND STRL 8 (GLOVE) ×1 IMPLANT
GLOVE BIOGEL PI INDICATOR 7.0 (GLOVE) ×1
GLOVE BIOGEL PI INDICATOR 8 (GLOVE) ×1
GLOVE ECLIPSE 8.0 STRL XLNG CF (GLOVE) ×2 IMPLANT
GOWN PREVENTION PLUS XLARGE (GOWN DISPOSABLE) ×4 IMPLANT
KIT MARKER MARGIN INK (KITS) ×2 IMPLANT
NEEDLE HYPO 25X1 1.5 SAFETY (NEEDLE) ×2 IMPLANT
NS IRRIG 1000ML POUR BTL (IV SOLUTION) ×2 IMPLANT
PACK BASIN DAY SURGERY FS (CUSTOM PROCEDURE TRAY) ×2 IMPLANT
PENCIL BUTTON HOLSTER BLD 10FT (ELECTRODE) ×2 IMPLANT
SLEEVE SCD COMPRESS KNEE MED (MISCELLANEOUS) ×2 IMPLANT
SPONGE LAP 4X18 X RAY DECT (DISPOSABLE) ×2 IMPLANT
SUT MON AB 4-0 PC3 18 (SUTURE) ×2 IMPLANT
SUT SILK 2 0 SH (SUTURE) IMPLANT
SUT VIC AB 3-0 SH 27 (SUTURE) ×2
SUT VIC AB 3-0 SH 27X BRD (SUTURE) ×1 IMPLANT
SYR CONTROL 10ML LL (SYRINGE) ×2 IMPLANT
TOWEL OR 17X24 6PK STRL BLUE (TOWEL DISPOSABLE) IMPLANT
TOWEL OR NON WOVEN STRL DISP B (DISPOSABLE) ×2 IMPLANT
TUBE CONNECTING 20X1/4 (TUBING) ×2 IMPLANT
WATER STERILE IRR 1000ML POUR (IV SOLUTION) IMPLANT
YANKAUER SUCT BULB TIP NO VENT (SUCTIONS) ×2 IMPLANT

## 2011-06-22 NOTE — Anesthesia Postprocedure Evaluation (Signed)
  Anesthesia Post-op Note  Patient: Jessica Mendoza  Procedure(s) Performed: Procedure(s) (LRB): BREAST LUMPECTOMY WITH NEEDLE LOCALIZATION (Right)  Patient Location: PACU  Anesthesia Type: General  Level of Consciousness: awake  Airway and Oxygen Therapy: Patient Spontanous Breathing  Post-op Pain: mild  Post-op Assessment: Post-op Vital signs reviewed, Patient's Cardiovascular Status Stable, Respiratory Function Stable and Patent Airway  Post-op Vital Signs: stable  Complications: No apparent anesthesia complications

## 2011-06-22 NOTE — Transfer of Care (Signed)
Immediate Anesthesia Transfer of Care Note  Patient: Jessica Mendoza  Procedure(s) Performed: Procedure(s) (LRB): BREAST LUMPECTOMY WITH NEEDLE LOCALIZATION (Right)  Patient Location: PACU  Anesthesia Type: General  Level of Consciousness: sedated  Airway & Oxygen Therapy: Patient Spontanous Breathing and Patient connected to face mask oxygen  Post-op Assessment: Report given to PACU RN and Post -op Vital signs reviewed and stable  Post vital signs: Reviewed and stable  Complications: No apparent anesthesia complications

## 2011-06-22 NOTE — Op Note (Signed)
Breast Lumpectomy with Right with wire localization  Indications: This patient presents with history of a right breast mass. Given the clinical history and physical exam, along with indicated diagnostic studies, breast biopsy will be performed.  Core showed ADH.  Pre-operative Diagnosis: right breast mass / ADH  Post-operative Diagnosis:  SAME  Surgeon: Jadarrius Maselli A.   Assistants: or staff  Anesthesia: General LMA anesthesia and Local anesthesia 0.25.% bupivacaine, with epinephrine  ASA Class: 2  Procedure Details  The patient was seen in the Holding Room. Pt underwent wire localization at [facility]. The risks, benefits, complications, treatment options, and expected outcomes were discussed with the patient. The possibilities of reaction to medication, pulmonary aspiration, bleeding, infection, the need for additional procedures, failure to diagnose a condition, and creating a complication requiring transfusion or operation were discussed with the patient. The patient concurred with the proposed plan, giving informed consent. The site of surgery properly noted/marked. The patient was taken to Operating Room, identified as POLA FURNO, and the procedure verified as lumpectomy. A Time Out was held and the above information confirmed.  After induction of anesthesia, the right breast and chest were prepped and draped in standard fashion. The wire was trimmed. The lumpectomy was performed by creating an oblique incision over the upper outer quadrant of the breast.The wire and tissue around the wire were excised. Paint kit used and hemostasis was achieved with cautery. The wound was irrigated and closed with a 3-0 Vicryl subcuticular and  4-0  monocryl closure in layers.    Sterile dressings were applied. At the end of the operation, all sponge, instrument, and needle counts were correct.  Findings: grossly clear surgical margins  Estimated Blood Loss:  Minimal         Drains: none    Total IV Fluids: 500 ccmL         Specimens: breast mass             Complications:  None; patient tolerated the procedure well.         Disposition: PACU - hemodynamically stable.         Condition: Stable

## 2011-06-22 NOTE — H&P (View-Only) (Signed)
Patient ID: Jessica Mendoza, female   DOB: 1955/05/12, 56 y.o.   MRN: [id number]  Chief Complaint  Patient presents with  . Pre-op Exam    Right breast sx    HPI Jessica Mendoza is a 56 y.o. female.   HPIPatient sent at the request of Dr. Tilda Burrow do 2 calcifications noted on recent screening mammogram on the right. 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 She denies any history of breast mass, nipple discharge or change in the appearance of either breast. She does have mild breast pain bilaterally. This is not severe. This is been present for many years.  Past Medical History  Diagnosis Date  . Diabetes mellitus, type 2 06/2010  . Essential hypertension, benign   . Other and unspecified hyperlipidemia   . Migraines     menstrual  . Colon polyps 12/2005  . Anemia     iron def  . Focal nodular hyperplasia of liver     bx 2002  . Benign positional vertigo   . Goiter 10/2006    Past Surgical History  Procedure Date  . Liver biopsy 2002    Family History  Problem Relation Age of Onset  . Hypertension Mother   . Heart attack Mother   . Alcohol abuse Father   . Hypertension Brother   . Hyperlipidemia Brother   . Ovarian cancer Maternal Aunt   . Cancer Maternal Aunt     Uterine cancer  . Pancreatic cancer Maternal Grandfather   . Cancer Maternal Grandfather     Pancreatic    Social History History  Substance Use Topics  . Smoking status: Former Smoker -- 25 years    Quit date: 02/13/1991  . Smokeless tobacco: Not on file  . Alcohol Use: Yes     occas    Allergies  Allergen Reactions  . Simvastatin     Myalgias  ?? lipitor     Current Outpatient Prescriptions  Medication Sig Dispense Refill  . amLODipine (NORVASC) 10 MG tablet Take 10 mg by mouth daily.      Marland Kitchen aspirin 81 MG tablet Take 81 mg by mouth daily.      . Coenzyme Q10 (CO Q 10) 100 MG CAPS Take by mouth.      . Fish Oil-Cholecalciferol  (FISH OIL + D3 PO) Take by mouth.      . levothyroxine (SYNTHROID, LEVOTHROID) 25 MCG tablet Take 25 mcg by mouth daily.      Marland Kitchen losartan-hydrochlorothiazide (HYZAAR) 100-12.5 MG per tablet Take 1 tablet by mouth daily.      . metFORMIN (GLUCOPHAGE XR) 500 MG 24 hr tablet Take 2 tablets daily  60 tablet  12  . metoprolol succinate (TOPROL-XL) 100 MG 24 hr tablet Take 100 mg by mouth daily. Take with or immediately following a meal.      . rosuvastatin (CRESTOR) 10 MG tablet Take 10 mg by mouth daily.      . vitamin C (ASCORBIC ACID) 500 MG tablet Take 500 mg by mouth daily.        Review of Systems Review of Systems  Constitutional: Negative for fever, chills and unexpected weight change.  HENT: Negative for hearing loss, congestion, sore throat, trouble swallowing and voice change.   Eyes: Negative for visual disturbance.  Respiratory: Negative for cough and wheezing.   Cardiovascular: Negative for chest pain, palpitations and leg swelling.  Gastrointestinal: Negative for nausea, vomiting, abdominal pain, diarrhea, constipation, blood  in stool, abdominal distention and anal bleeding.  Genitourinary: Negative for hematuria, vaginal bleeding and difficulty urinating.  Musculoskeletal: Negative for arthralgias.  Skin: Negative for rash and wound.  Neurological: Negative for seizures, syncope and headaches.  Hematological: Negative for adenopathy. Does not bruise/bleed easily.  Psychiatric/Behavioral: Negative for confusion.    Blood pressure 120/74, pulse 78, temperature 97.3 F (36.3 C), temperature source Temporal, resp. rate 16, height 5\' 5"  (1.651 m), weight 214 lb (97.07 kg).  Physical Exam Physical Exam  Constitutional: She appears well-developed and well-nourished.  HENT:  Head: Normocephalic and atraumatic.  Eyes: EOM are normal. Pupils are equal, round, and reactive to light.  Neck: Normal range of motion. Neck supple.  Cardiovascular: Normal rate and regular rhythm.     Pulmonary/Chest: Effort normal and breath sounds normal.       Right breast status post biopsy changes. No hematoma or mass noted. Right axilla normal. Left breast there is no mass lesion. Both nipples normal. Left axilla normal.  Neurological: She is alert.  Skin: Skin is warm and dry.  Psychiatric: She has a normal mood and affect. Her behavior is normal. Judgment and thought content normal.    Data Reviewed Mammograms from [facility]  cluster right breast microcalcifications upper-outer quadrant core biopsy shows atypical ductal hyperplasia. Left breast normal.  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 Pros and cons of each were discussed. Risks of each were discussed. She was to proceed with right breast needle localized lumpectomy.The procedure has been discussed with the patient. Alternatives to surgery have been discussed with the patient.  Risks of surgery include bleeding,  Infection,  Seroma formation, death,  and the need for further surgery.   The patient understands and wishes to proceed.       Rozell Kettlewell A. 06/15/2011, 12:26 PM

## 2011-06-22 NOTE — Anesthesia Procedure Notes (Signed)
Procedure Name: LMA Insertion Date/Time: 06/22/2011 1:23 PM Performed by: Burna Cash Pre-anesthesia Checklist: Patient identified, Emergency Drugs available, Suction available and Patient being monitored Patient Re-evaluated:Patient Re-evaluated prior to inductionOxygen Delivery Method: Circle System Utilized Preoxygenation: Pre-oxygenation with 100% oxygen Intubation Type: IV induction Ventilation: Mask ventilation without difficulty LMA: LMA inserted LMA Size: 4.0 Number of attempts: 1 Airway Equipment and Method: bite block Placement Confirmation: positive ETCO2 Tube secured with: Tape Dental Injury: Teeth and Oropharynx as per pre-operative assessment

## 2011-06-22 NOTE — Interval H&P Note (Signed)
History and Physical Interval Note:  06/22/2011 12:45 PM  Jessica Mendoza  has presented today for surgery, with the diagnosis of Right breast lesion  The various methods of treatment have been discussed with the patient and family. After consideration of risks, benefits and other options for treatment, the patient has consented to  Procedure(s) (LRB): BREAST LUMPECTOMY WITH NEEDLE LOCALIZATION (Right) as a surgical intervention .  The patients' history has been reviewed, patient examined, no change in status, stable for surgery.  I have reviewed the patients' chart and labs.  Questions were answered to the patient's satisfaction.     [name] A.

## 2011-06-22 NOTE — Anesthesia Preprocedure Evaluation (Signed)
Anesthesia Evaluation  Patient identified by MRN, date of birth, ID band Patient awake    Reviewed: Allergy & Precautions, H&P , NPO status , Patient's Chart, lab work & pertinent test results  Airway Mallampati: II TM Distance: >3 FB Neck ROM: Full    Dental   Pulmonary    Pulmonary exam normal       Cardiovascular hypertension,     Neuro/Psych  Headaches,    GI/Hepatic   Endo/Other  Diabetes mellitus-Hypothyroidism   Renal/GU      Musculoskeletal   Abdominal (+) + obese,   Peds  Hematology   Anesthesia Other Findings   Reproductive/Obstetrics                           Anesthesia Physical Anesthesia Plan  ASA: III  Anesthesia Plan: General   Post-op Pain Management:    Induction: Intravenous  Airway Management Planned: LMA  Additional Equipment:   Intra-op Plan:   Post-operative Plan: Extubation in OR  Informed Consent: I have reviewed the patients History and Physical, chart, labs and discussed the procedure including the risks, benefits and alternatives for the proposed anesthesia with the patient or authorized representative who has indicated his/her understanding and acceptance.     Plan Discussed with: CRNA and Surgeon  Anesthesia Plan Comments:         Anesthesia Quick Evaluation

## 2011-06-22 NOTE — Discharge Instructions (Signed)
°Post Anesthesia Home Care Instructions ° °Activity: °Get plenty of rest for the remainder of the day. A responsible adult should stay with you for 24 hours following the procedure.  °For the next 24 hours, DO NOT: °-Drive a car °-Operate machinery °-Drink alcoholic beverages °-Take any medication unless instructed by your physician °-Make any legal decisions or sign important papers. ° °Meals: °Start with liquid foods such as gelatin or soup. Progress to regular foods as tolerated. Avoid greasy, spicy, heavy foods. If nausea and/or vomiting occur, drink only clear liquids until the nausea and/or vomiting subsides. Call your physician if vomiting continues. ° °Special Instructions/Symptoms: °Your throat may feel dry or sore from the anesthesia or the breathing tube placed in your throat during surgery. If this causes discomfort, gargle with warm salt water. The discomfort should disappear within 24 hours. ° ° ° ° ° ° ° °[facility] °Office Phone Number [phone] ° °BREAST BIOPSY/ PARTIAL MASTECTOMY: POST OP INSTRUCTIONS ° °Always review your discharge instruction sheet given to you by the facility where your surgery was performed. ° °IF YOU HAVE DISABILITY OR FAMILY LEAVE FORMS, YOU MUST BRING THEM TO THE OFFICE FOR PROCESSING.  DO NOT GIVE THEM TO YOUR DOCTOR. ° °1. A prescription for pain medication may be given to you upon discharge.  Take your pain medication as prescribed, if needed.  If narcotic pain medicine is not needed, then you may take acetaminophen (Tylenol) or ibuprofen (Advil) as needed. °2. Take your usually prescribed medications unless otherwise directed °3. If you need a refill on your pain medication, please contact your pharmacy.  They will contact our office to request authorization.  Prescriptions will not be filled after 5pm or on week-ends. °4. You should eat very light the first 24 hours after surgery, such as soup, crackers, pudding, etc.  Resume your normal diet the  day after surgery. °5. Most patients will experience some swelling and bruising in the breast.  Ice packs and a good support bra will help.  Swelling and bruising can take several days to resolve.  °6. It is common to experience some constipation if taking pain medication after surgery.  Increasing fluid intake and taking a stool softener will usually help or prevent this problem from occurring.  A mild laxative (Milk of Magnesia or Miralax) should be taken according to package directions if there are no bowel movements after 48 hours. °7. Unless discharge instructions indicate otherwise, you may remove your bandages 24-48 hours after surgery, and you may shower at that time.  You may have steri-strips (small skin tapes) in place directly over the incision.  These strips should be left on the skin for 7-10 days.  If your surgeon used skin glue on the incision, you may shower in 24 hours.  The glue will flake off over the next 2-3 weeks.  Any sutures or staples will be removed at the office during your follow-up visit. °8. ACTIVITIES:  You may resume regular daily activities (gradually increasing) beginning the next day.  Wearing a good support bra or sports bra minimizes pain and swelling.  You may have sexual intercourse when it is comfortable. °a. You may drive when you no longer are taking prescription pain medication, you can comfortably wear a seatbelt, and you can safely maneuver your car and apply brakes. °b. RETURN TO WORK:  ______________________________________________________________________________________ °9. You should see your doctor in the office for a follow-up appointment approximately two weeks after your surgery.  Your doctor’s nurse will   typically make your follow-up appointment when she calls you with your pathology report.  Expect your pathology report 2-3 business days after your surgery.  You may call to check if you do not hear from us after three days. °10. OTHER INSTRUCTIONS:  _______________________________________________________________________________________________ _____________________________________________________________________________________________________________________________________ °_____________________________________________________________________________________________________________________________________ °_____________________________________________________________________________________________________________________________________ ° °WHEN TO CALL YOUR DOCTOR: °1. Fever over 101.0 °2. Nausea and/or vomiting. °3. Extreme swelling or bruising. °4. Continued bleeding from incision. °5. Increased pain, redness, or drainage from the incision. ° °The clinic staff is available to answer your questions during regular business hours.  Please don’t hesitate to call and ask to speak to one of the nurses for clinical concerns.  If you have a medical emergency, go to the nearest emergency room or call 911.  A surgeon from [facility] is always on call at the hospital. ° °For further questions, please visit [email or web address]  °

## 2011-06-27 ENCOUNTER — Encounter (INDEPENDENT_AMBULATORY_CARE_PROVIDER_SITE_OTHER): Payer: Self-pay

## 2011-06-28 ENCOUNTER — Encounter (HOSPITAL_BASED_OUTPATIENT_CLINIC_OR_DEPARTMENT_OTHER): Payer: Self-pay

## 2011-06-29 ENCOUNTER — Encounter (INDEPENDENT_AMBULATORY_CARE_PROVIDER_SITE_OTHER): Payer: Self-pay | Admitting: Surgery

## 2011-06-29 ENCOUNTER — Ambulatory Visit (INDEPENDENT_AMBULATORY_CARE_PROVIDER_SITE_OTHER): Payer: 59 | Admitting: Surgery

## 2011-06-29 VITALS — BP 128/82 | HR 76 | Temp 97.2°F | Resp 16 | Ht 65.0 in | Wt 214.2 lb

## 2011-06-29 DIAGNOSIS — Z9889 Other specified postprocedural states: Secondary | ICD-10-CM

## 2011-06-29 NOTE — Progress Notes (Signed)
Patient returns after right breast needle local lumpectomy. Pathology showed atypical ductal hyperplasia. There is no atypia.  On exam incision clean dry and intact right breast. No redness.  Impression: Right breast atypical ductal hyperplasia after lumpectomy  Plan: Refer to high risk clinic for evaluation with Dr. Welton Flakes. As needed followup

## 2011-06-29 NOTE — Patient Instructions (Signed)
Refer to oncologist due to   Atypical ductal hyperplasia.

## 2011-07-02 ENCOUNTER — Other Ambulatory Visit: Payer: Self-pay | Admitting: Oncology

## 2011-07-02 NOTE — Progress Notes (Signed)
In the next 4-5 weeks on Tuesday mornings

## 2011-07-04 ENCOUNTER — Telehealth: Payer: Self-pay | Admitting: *Deleted

## 2011-07-04 NOTE — Telephone Encounter (Signed)
spoke with patient on 07-04-2011 she confirmed over the phone the new date and time of the high risk clinic appointment on 07-18-2011 at 1:00pm

## 2011-07-05 ENCOUNTER — Encounter (INDEPENDENT_AMBULATORY_CARE_PROVIDER_SITE_OTHER): Payer: Self-pay

## 2011-07-17 ENCOUNTER — Ambulatory Visit (INDEPENDENT_AMBULATORY_CARE_PROVIDER_SITE_OTHER): Payer: 59 | Admitting: Emergency Medicine

## 2011-07-17 VITALS — BP 106/67 | HR 67 | Temp 98.0°F | Resp 18 | Ht 65.5 in | Wt 212.4 lb

## 2011-07-17 DIAGNOSIS — E119 Type 2 diabetes mellitus without complications: Secondary | ICD-10-CM

## 2011-07-17 DIAGNOSIS — N6089 Other benign mammary dysplasias of unspecified breast: Secondary | ICD-10-CM

## 2011-07-17 DIAGNOSIS — Z13820 Encounter for screening for osteoporosis: Secondary | ICD-10-CM

## 2011-07-17 DIAGNOSIS — E782 Mixed hyperlipidemia: Secondary | ICD-10-CM

## 2011-07-17 DIAGNOSIS — E039 Hypothyroidism, unspecified: Secondary | ICD-10-CM | POA: Insufficient documentation

## 2011-07-17 LAB — LIPID PANEL
Cholesterol: 176 mg/dL (ref 0–200)
HDL: 52 mg/dL (ref >39)
LDL Cholesterol: 97 mg/dL (ref 0–99)
Total CHOL/HDL Ratio: 3.4 Ratio
Triglycerides: 137 mg/dL (ref <150)
VLDL: 27 mg/dL (ref 0–40)

## 2011-07-17 LAB — POCT GLYCOSYLATED HEMOGLOBIN (HGB A1C): Hemoglobin A1C: 6.2

## 2011-07-17 LAB — GLUCOSE, POCT (MANUAL RESULT ENTRY): POC Glucose: 120 mg/dL — AB (ref 70–99)

## 2011-07-17 NOTE — Progress Notes (Signed)
  Subjective:    Patient ID: Jessica Mendoza, female    DOB: March 18, 1955, 56 y.o.   MRN: [id number]  HPI patient in for her three-month check. She has had some problems recently with an abnormal mammogram. This disclosed ductal hyperplasia with dysplasia but no evidence of cancer. She had surgery on this area. She is scheduled to see the oncologist tomorrow. Overall her diabetes she's been doing well taking her medicine and her watching her diet.    Review of Systems no chest pain shortness of breath or other complaints     Objective:   Physical Exam HEENT exam is unremarkable the neck is supple. Chest is clear to auscultation and percussion. Cardiac exam is unremarkable.    Results for orders placed in visit on 07/17/11  POCT GLYCOSYLATED HEMOGLOBIN (HGB A1C)      Component Value Range   Hemoglobin A1C 6.2    GLUCOSE, POCT (MANUAL RESULT ENTRY)      Component Value Range   POC Glucose 120 (*) 70 - 99 (mg/dl)     Assessment & Plan:  Diabetes mellitus is at goal. She will proceed to see the oncologist tomorrow and decide on the next step for her abnormal breast biopsy.

## 2011-07-18 ENCOUNTER — Encounter: Payer: Self-pay | Admitting: Family

## 2011-07-18 ENCOUNTER — Telehealth: Payer: Self-pay | Admitting: Oncology

## 2011-07-18 ENCOUNTER — Ambulatory Visit (HOSPITAL_BASED_OUTPATIENT_CLINIC_OR_DEPARTMENT_OTHER): Payer: 59 | Admitting: Family

## 2011-07-18 VITALS — BP 129/83 | HR 75 | Temp 97.7°F | Ht 65.5 in | Wt 213.6 lb

## 2011-07-18 DIAGNOSIS — N6099 Unspecified benign mammary dysplasia of unspecified breast: Secondary | ICD-10-CM

## 2011-07-18 DIAGNOSIS — N6089 Other benign mammary dysplasias of unspecified breast: Secondary | ICD-10-CM

## 2011-07-18 LAB — TSH: TSH: 1.179 u[IU]/mL (ref 0.350–4.500)

## 2011-07-18 NOTE — Progress Notes (Signed)
[facility]  High Risk Clinic New Patient Evaluation  Name: Jessica Mendoza            Date: 07/18/2011 MRN: [id number]                DOB: 07-Feb-1956  CC: Lucilla Edin, MD, MD     REFERRING PHYSICIAN: Collene Gobble, MD   REASON FOR VISIT:Atypical ductal hyperplasia, right breast on breast biopsy. Here for recommendations regarding risk reduction.   HISTORY OF PRESENT ILLNESS: Calcifications noted on routine screening mammogram, May. Had biopsy which showed ADH, lumpectomy confirmed.   PREVIOUS BREAST/OVARIAN BIOPSIES: 06/22/11, right lumpectomy which showed atypical ductal hyperplasia with complex sclerosing lesion. All margins of excision were clear.   PREVIOUS PATHOLOGY:  ADH on needle core biopsy, which led to above lumpectomy.   PAST MEDICAL HISTORY:  has a past medical history of Diabetes mellitus, type 2 (06/2010); Essential hypertension, benign; Other and unspecified hyperlipidemia; Migraines; Colon polyps (12/2005); Focal nodular hyperplasia of liver; Benign positional vertigo; Goiter (10/2006); Hypothyroidism; and Arthritis.  PAST SURGICAL HISTORY:  Past Surgical History  Procedure Date  . Liver biopsy 2002  . Breast lumpectomy     right breast- benign      ALLERGIES: Simvastatin  SOCIAL HISTORY:  reports that she quit smoking about 20 years ago. No smokeless tobacco history on file. Drinks alcohol once or twice yearly. No illicit drugs.  HEALTH HABITS: Vitamins: D3, Vit. C Supplements: COQ-10, fish oil Alternative Therapies: None Adverse environmental exposure: None Exercises regularly:     57   Min/wk:  REPRODUCTIVE HISTORY:  Menarche age: 18 Gravida:  2     Para: 1 First Live Birth: 36 Took fertility meds: no Menopause: natural/surgical  Age 56 HRT Y/N Currently Y/N   FAMILY HISTORY:  family history includes alcohol abuse in her father; Endometrial cancer in her maternal aunt and pancreatic cancer in the maternal grandfather;  Heart attack in her mother; Hyperlipidemia in her brother; Hypertension in her brother and mother  HEALTH MAINTENANCE: Last mammogram: May 2013 Last clinical breast exam: May 2013 Performs self breast exam: yes Last Pap Smear: March 2013, normal Colonoscopy: 2010, normal  Gail Model Risk: 1.7% 5 year, 9.7% Lifetime risk.   REVIEW OF SYSTEMS:  General: Negative for fever, chills, night sweats,  loss of appetite or weight loss. HEENT: Negative for headaches, sore  throat, difficulty swallowing, blurred vision or problem with hearing or  sinus congestion. Respiratory: Negative for shortness of breath, cough  or dyspnea on exertion. Cardiovascular: Negative for chest pain,  palpitations or pedal edema. GI: Negative for nausea, vomiting,  diarrhea, constipation, change in bowel habits or blood in the stool.  No jaundice. GU: Negative for painful or frequent urination, change in  color of urine, or decreased urinary stream. Integumentary: Negative  for skin rashes or other suspicious skin lesions. Hematologic: Negative  for easy bruisability or bleeding. Musculoskeletal: Negative for  complaints of pain, arthralgias, arthritis or myalgias.  Neurological/psychiatric: Negative for numbness, focal weakness,  balance problems or coordination difficulties. No depression or mood swings.  Breast: No self detected abnormalities in the breast. No nipple discharge, masses or redness of the skin.   PHYSICAL EXAM: BP 129/83  Pulse 75  Temp(Src) 97.7 F (36.5 C) (Oral)  Ht 5' 5.5" (1.664 m)  Wt 213 lb 9.6 oz (96.888 kg)  BMI 35.00 kg/m2 GENERAL: Well developed, well nourished, in no acute distress.  EENT: No ocular or oral lesions.  No stomatitis.  RESPIRATORY: Lungs are clear to auscultation bilaterally with normal respiratory movement and no accessory muscle use. CARDIAC: No murmur, rub or tachycardia. No upper or lower extremity edema.  GI: Abdomen is soft, no palpable hepatosplenomegaly. No  fluid wave. No tenderness. Musculoskeletal: No kyphosis, no tenderness over the spine, ribs or hips. Lymph: No cervical, infraclavicular, axillary or inguinal adenopathy. Neuro: No focal neurological deficits. Psych: Alert and oriented X 3, appropriate mood and affect.  BREAST EXAM: In the supine position, with the right arm over the head, right nipple is everted. No periareolar edema or nipple discharge. No mass in any quadrant or subareolar region. At the 11 o'clock position, a 3 cm well healed scar with volume deficit underlying. No redness of the skin. No right axillary adenopathy. With the left arm over the head, left nipple is everted. No periareolar edema or nipple discharge. No mass in any quadrant or subareolar region. No redness of the skin. No left axillary adenopathy. Breasts are slightly more glandular than expected for age.    ASSESSMENT: 56 year old with:  1. History of atypical ductal hyperplasia on right breast core biopsy, confirmed on right breast lumpectomy.   PLAN:  1. Return to High Risk Clinic in 6 months for surveillance.  2. Mammogram in May 2014.   DISCUSSION:  We reviewed her pathology report and I explain atypical ductal hyperplasia. Based on 1.7% 5 year risk of developing breast cancer according to the [facility], we discussed the use of Tamoxifen for risk reduction. She had done significant reading about Tamoxifen and Raloxifene and has concerns about the reported side effects. I explain the mechanism of action and the relative risk reduction achieved with the use of Tamoxifen. She would like to discuss with her husband and revisit the subject at her next appointment. A total of 40 minutes were spent with the patient. 30 minutes of those were spent counseling on the above topics and discussing other methods of risk reduction, according to NCCN guidelines.

## 2011-07-18 NOTE — Telephone Encounter (Signed)
gve the pt her dec 2013 appt calendar °

## 2011-08-06 ENCOUNTER — Other Ambulatory Visit: Payer: Self-pay | Admitting: Emergency Medicine

## 2011-08-14 ENCOUNTER — Telehealth: Payer: Self-pay

## 2011-08-14 NOTE — Telephone Encounter (Signed)
PT STATES SHE HAVE A MAIL ORDER FOR HER METOPROLOL, BUT IT WON'T COME UNTIL THE 10TH, WOULD LIKE TO HAVE A WEEKS SUPPLY CALLED IN PLEASE CALL PT AT [phone]   [facility] ON [address]

## 2011-08-15 MED ORDER — METOPROLOL SUCCINATE ER 100 MG PO TB24: 100.0000 mg | ORAL_TABLET | Freq: Every day | ORAL | Status: DC

## 2011-08-15 NOTE — Telephone Encounter (Signed)
Rx sent to pharmacy   

## 2011-08-15 NOTE — Telephone Encounter (Signed)
Left message for her to advise Ruwayda Curet

## 2011-08-16 ENCOUNTER — Other Ambulatory Visit: Payer: Self-pay

## 2011-08-16 NOTE — Telephone Encounter (Signed)
Pt LM on nurse VM asking for refill on her medication that she is almost out of. Stated the pharmacy has requested it but have not heard back from [place]. Checked records and amlodipine and Hyzaar were sent to [facility] on 08/06/11. LMOM for pt on cell # which she left that this was done, but to call us and let her know if she needs some locally to cover her until they arrive, or if she needs another medication.

## 2011-08-16 NOTE — Telephone Encounter (Signed)
Advised pt that her Hyzaar and Amlodipine RFs were sent to [facility] on 6/24 and Metoprolol was sent to [facility] yesterday. Pt reports that [facility] does not have a record of the two RFs that we sent and she also needs the 90 day supply of metoprolol sent to [facility]. Tried to call [facility] to check on RFs and their computers were down - recording said to try back later in day. Pt also needed about 14 days of Hyzaar called to local [facility]. [facility] [address]

## 2011-08-16 NOTE — Telephone Encounter (Signed)
Tried to call [facility] again and recording still stated that they are experiencing technical difficulties - will try to CB in morning.

## 2011-08-17 MED ORDER — METOPROLOL SUCCINATE ER 100 MG PO TB24: 100.0000 mg | ORAL_TABLET | Freq: Every day | ORAL | Status: DC

## 2011-08-17 MED ORDER — AMLODIPINE BESYLATE 10 MG PO TABS: 10.0000 mg | ORAL_TABLET | Freq: Every day | ORAL | Status: DC

## 2011-08-17 MED ORDER — LOSARTAN POTASSIUM-HCTZ 100-12.5 MG PO TABS: 1.0000 | ORAL_TABLET | Freq: Every day | ORAL | Status: DC

## 2011-08-17 NOTE — Telephone Encounter (Signed)
Notified pt that all three Rxs were called into [facility] this morning. Pt stated that she thinks that she is going to opt out of mail order and just get locally bc she has been having trouble w/mail order. She will ask [facility] if they can transfer the Rxs from [facility], and will CB if she needs [place] to change them instead.

## 2011-08-17 NOTE — Telephone Encounter (Signed)
Spoke w/pharmacist at [facility] and was told that the amlodipine and hyzaar came through as denied on 6/24 for some reason. I went ahead and called in #90 + 1 RF on Amlodipine, Hyzaar and Metoprolol succinate for pt since she just had OV in June

## 2011-08-17 NOTE — Telephone Encounter (Signed)
Pt CB and stated that she had to cancel them at [facility] and asked if we could send them to [facility] instead. Sending all three Rxs to [facility].

## 2011-08-17 NOTE — Addendum Note (Signed)
Addended by: Sheppard Plumber A on: 08/17/2011 09:59 AM   Modules accepted: Orders

## 2011-10-18 ENCOUNTER — Other Ambulatory Visit: Payer: Self-pay | Admitting: Endocrinology

## 2011-10-18 DIAGNOSIS — E049 Nontoxic goiter, unspecified: Secondary | ICD-10-CM

## 2011-10-19 ENCOUNTER — Ambulatory Visit
Admission: RE | Admit: 2011-10-19 | Discharge: 2011-10-19 | Disposition: A | Discharge status: Home / Self Care | Payer: 59 | Source: Ambulatory Visit | Attending: Endocrinology | Admitting: Endocrinology

## 2011-10-19 DIAGNOSIS — E049 Nontoxic goiter, unspecified: Secondary | ICD-10-CM

## 2011-10-30 ENCOUNTER — Ambulatory Visit: Payer: 59 | Admitting: Emergency Medicine

## 2011-11-20 ENCOUNTER — Ambulatory Visit (INDEPENDENT_AMBULATORY_CARE_PROVIDER_SITE_OTHER): Payer: 59 | Admitting: Emergency Medicine

## 2011-11-20 ENCOUNTER — Encounter: Payer: Self-pay | Admitting: Emergency Medicine

## 2011-11-20 VITALS — BP 110/60 | HR 64 | Temp 98.8°F | Resp 16 | Ht 65.5 in | Wt 210.0 lb

## 2011-11-20 DIAGNOSIS — E785 Hyperlipidemia, unspecified: Secondary | ICD-10-CM

## 2011-11-20 DIAGNOSIS — E119 Type 2 diabetes mellitus without complications: Secondary | ICD-10-CM

## 2011-11-20 DIAGNOSIS — I10 Essential (primary) hypertension: Secondary | ICD-10-CM

## 2011-11-20 DIAGNOSIS — Z23 Encounter for immunization: Secondary | ICD-10-CM

## 2011-11-20 DIAGNOSIS — Z139 Encounter for screening, unspecified: Secondary | ICD-10-CM

## 2011-11-20 LAB — LIPID PANEL
Cholesterol: 193 mg/dL (ref 0–200)
HDL: 58 mg/dL (ref >39)
LDL Cholesterol: 107 mg/dL — ABNORMAL HIGH (ref 0–99)
Total CHOL/HDL Ratio: 3.3 {ratio}
Triglycerides: 140 mg/dL (ref <150)
VLDL: 28 mg/dL (ref 0–40)

## 2011-11-20 LAB — GLUCOSE, POCT (MANUAL RESULT ENTRY): POC Glucose: 97 mg/dl (ref 70–99)

## 2011-11-20 LAB — POCT GLYCOSYLATED HEMOGLOBIN (HGB A1C): Hemoglobin A1C: 6

## 2011-11-20 NOTE — Progress Notes (Signed)
  Subjective:    Patient ID: Jessica Mendoza, female    DOB: 13-Nov-1955, 56 y.o.   MRN: [id number]  HPI patient in followup for diabetes hypertension and high cholesterol. She also is on medication for hypothyroidism. She has not been following her diet as good as usual but has been taking her medications regularly. Her last hemoglobin A1c was 6.2. She wants to enter a program called [facility]. which is a aerobic exercise program.    Review of Systems     Objective:   Physical Exam HEENT is unremarkable neck supple chest clear cardiac regular rate without murmurs rubs or gallops. Extremities without edema  Results for orders placed in visit on 11/20/11  GLUCOSE, POCT (MANUAL RESULT ENTRY)      Component Value Range   POC Glucose 97  70 - 99 mg/dl  POCT GLYCOSYLATED HEMOGLOBIN (HGB A1C)      Component Value Range   Hemoglobin A1C 6.0          Assessment & Plan:  I think this program would be great for her. I will check a baseline EKG and check her routine labs today.

## 2011-12-05 ENCOUNTER — Encounter: Payer: Self-pay | Admitting: Emergency Medicine

## 2012-01-22 ENCOUNTER — Telehealth: Payer: Self-pay | Admitting: *Deleted

## 2012-01-22 ENCOUNTER — Ambulatory Visit: Payer: 59 | Admitting: Oncology

## 2012-01-22 NOTE — Telephone Encounter (Signed)
Patient called left voice message to confirm her appointment for 01-23-2012 but the patients appointment was 01-22-2012 the number the patient left was [phone] was not able to leave voice message to inform the patient of the date and time of the real appointment on 01-22-2012

## 2012-01-22 NOTE — Telephone Encounter (Signed)
Called patient back again at [phone] was able to leave voice message this time left voice message stating that the patient appointment was for 01-22-2012 at 9:00am she did not have an appointment for 01-23-2012 left name and number on the voice message for the patient to call back to [phone] and ask for dr.khan scheduler

## 2012-01-23 ENCOUNTER — Telehealth: Payer: Self-pay | Admitting: *Deleted

## 2012-01-23 ENCOUNTER — Ambulatory Visit: Payer: 59 | Admitting: Family

## 2012-01-23 NOTE — Telephone Encounter (Signed)
Sent md an message to inform the scheduling department of a new date and time that she can see this patient

## 2012-01-29 ENCOUNTER — Other Ambulatory Visit: Payer: Self-pay | Admitting: Emergency Medicine

## 2012-01-30 ENCOUNTER — Telehealth: Payer: Self-pay | Admitting: Oncology

## 2012-01-30 NOTE — Telephone Encounter (Signed)
Returned pt's call re confirming appt for 03/13/12. Per pt when she s/w Jacki Cones she was told the appt was 02/18/12. lmonvm for pt @ home and work re appt d/t for 03/13/12. Pt called back and I was able to speak with her directly regarding appt for 03/14/11 @ 9am. Pt aware I will mail schedule. Schedule mailed today.

## 2012-02-08 ENCOUNTER — Other Ambulatory Visit: Payer: Self-pay | Admitting: *Deleted

## 2012-02-08 MED ORDER — ROSUVASTATIN CALCIUM 10 MG PO TABS: 10.0000 mg | ORAL_TABLET | Freq: Every day | ORAL | Status: DC

## 2012-02-17 ENCOUNTER — Ambulatory Visit (INDEPENDENT_AMBULATORY_CARE_PROVIDER_SITE_OTHER): Payer: 59 | Admitting: Emergency Medicine

## 2012-02-17 VITALS — BP 107/69 | HR 75 | Temp 98.1°F | Resp 16 | Ht 66.0 in | Wt 211.4 lb

## 2012-02-17 DIAGNOSIS — J101 Influenza due to other identified influenza virus with other respiratory manifestations: Secondary | ICD-10-CM

## 2012-02-17 DIAGNOSIS — R509 Fever, unspecified: Secondary | ICD-10-CM

## 2012-02-17 DIAGNOSIS — J111 Influenza due to unidentified influenza virus with other respiratory manifestations: Secondary | ICD-10-CM

## 2012-02-17 DIAGNOSIS — R059 Cough, unspecified: Secondary | ICD-10-CM

## 2012-02-17 DIAGNOSIS — R05 Cough: Secondary | ICD-10-CM

## 2012-02-17 LAB — POCT INFLUENZA A/B
Influenza A, POC: POSITIVE
Influenza B, POC: NEGATIVE

## 2012-02-17 MED ORDER — OSELTAMIVIR PHOSPHATE 75 MG PO CAPS: 75.0000 mg | ORAL_CAPSULE | Freq: Two times a day (BID) | ORAL | Status: DC

## 2012-02-17 NOTE — Progress Notes (Signed)
  Subjective:    Patient ID: STANLEY HELMUTH, female    DOB: 03-06-1955, 57 y.o.   MRN: [id number]  HPI pt presents with fever, cough--non productive. Has been around people with similar sx the patient did take a flu shot. She does complain of myalgias but not severe. Her temperature has been up to 101.7 .Marland Kitchen     Review of Systems     Objective:   Physical Exam patient is alert and cooperative does not appear toxic. Her neck is supple. Her chest is clear to both auscultation and percussion. Her HEENT exam is unremarkable.        Assessment & Plan:  Maryclare Labrador go ahead and check a flu swab. Patient has typical flu symptoms

## 2012-02-17 NOTE — Patient Instructions (Addendum)

## 2012-03-13 ENCOUNTER — Ambulatory Visit (HOSPITAL_BASED_OUTPATIENT_CLINIC_OR_DEPARTMENT_OTHER): Payer: 59 | Admitting: Oncology

## 2012-03-13 VITALS — BP 114/72 | HR 71 | Temp 97.7°F | Resp 20 | Ht 66.0 in | Wt 213.8 lb

## 2012-03-13 DIAGNOSIS — N6089 Other benign mammary dysplasias of unspecified breast: Secondary | ICD-10-CM

## 2012-03-13 DIAGNOSIS — N6099 Unspecified benign mammary dysplasia of unspecified breast: Secondary | ICD-10-CM

## 2012-03-13 NOTE — Patient Instructions (Addendum)
I will see you back as needed.  

## 2012-03-14 ENCOUNTER — Telehealth: Payer: Self-pay | Admitting: Oncology

## 2012-03-18 ENCOUNTER — Ambulatory Visit (INDEPENDENT_AMBULATORY_CARE_PROVIDER_SITE_OTHER): Payer: 59 | Admitting: Emergency Medicine

## 2012-03-18 ENCOUNTER — Encounter: Payer: Self-pay | Admitting: Emergency Medicine

## 2012-03-18 VITALS — BP 115/74 | HR 70 | Temp 97.9°F | Resp 18 | Wt 213.0 lb

## 2012-03-18 DIAGNOSIS — E559 Vitamin D deficiency, unspecified: Secondary | ICD-10-CM

## 2012-03-18 DIAGNOSIS — E119 Type 2 diabetes mellitus without complications: Secondary | ICD-10-CM

## 2012-03-18 DIAGNOSIS — D649 Anemia, unspecified: Secondary | ICD-10-CM

## 2012-03-18 DIAGNOSIS — C50919 Malignant neoplasm of unspecified site of unspecified female breast: Secondary | ICD-10-CM

## 2012-03-18 DIAGNOSIS — E785 Hyperlipidemia, unspecified: Secondary | ICD-10-CM

## 2012-03-18 DIAGNOSIS — I10 Essential (primary) hypertension: Secondary | ICD-10-CM

## 2012-03-18 DIAGNOSIS — R634 Abnormal weight loss: Secondary | ICD-10-CM

## 2012-03-18 LAB — CBC
HCT: 37.5 % (ref 36.0–46.0)
Hemoglobin: 12.7 g/dL (ref 12.0–15.0)
MCH: 26.8 pg (ref 26.0–34.0)
MCHC: 33.9 g/dL (ref 30.0–36.0)
MCV: 79.3 fL (ref 78.0–100.0)
Platelets: 266 10*3/uL (ref 150–400)
RBC: 4.73 MIL/uL (ref 3.87–5.11)
RDW: 15.5 % (ref 11.5–15.5)
WBC: 5.9 10*3/uL (ref 4.0–10.5)

## 2012-03-18 LAB — POCT GLYCOSYLATED HEMOGLOBIN (HGB A1C): Hemoglobin A1C: 6

## 2012-03-18 LAB — GLUCOSE, POCT (MANUAL RESULT ENTRY): POC Glucose: 96 mg/dl (ref 70–99)

## 2012-03-18 LAB — LIPID PANEL
Cholesterol: 185 mg/dL (ref 0–200)
HDL: 49 mg/dL (ref >39)
LDL Cholesterol: 98 mg/dL (ref 0–99)
Total CHOL/HDL Ratio: 3.8 {ratio}
Triglycerides: 189 mg/dL — ABNORMAL HIGH (ref <150)
VLDL: 38 mg/dL (ref 0–40)

## 2012-03-18 MED ORDER — LOSARTAN POTASSIUM-HCTZ 100-12.5 MG PO TABS: 1.0000 | ORAL_TABLET | Freq: Every day | ORAL | Status: DC

## 2012-03-18 MED ORDER — METOPROLOL SUCCINATE ER 100 MG PO TB24: 100.0000 mg | ORAL_TABLET | Freq: Every day | ORAL | Status: DC

## 2012-03-18 MED ORDER — CHOLECALCIFEROL 1.25 MG (50000 UT) PO TABS: 50000.0000 [IU] | ORAL_TABLET | ORAL | Status: DC

## 2012-03-18 MED ORDER — ROSUVASTATIN CALCIUM 10 MG PO TABS: 10.0000 mg | ORAL_TABLET | Freq: Every day | ORAL | Status: DC

## 2012-03-18 MED ORDER — AMLODIPINE BESYLATE 10 MG PO TABS: 10.0000 mg | ORAL_TABLET | Freq: Every day | ORAL | Status: DC

## 2012-03-18 NOTE — Progress Notes (Signed)
  Subjective:    Patient ID: Jessica Mendoza, female    DOB: 1955/11/20, 57 y.o.   MRN: [id number]  HPI patient here for recheck diabetes hypertension hyperlipidemia. She also has a history of ductal hyperplasia. She has been released by Dr. Lennette Bihari and will be followed here with yearly mammograms. She joined a Investment banker, corporate at [employer]. This involves Systems analyst and works with patients on weight loss exercise and strength training    Review of Systems     Objective:   Physical Exam HEENT exam is unremarkable her neck is supple. Chest is clear heart regular rate no murmurs abdomen soft nontender liver and spleen not large extremity exam reveals pulses to be 2+ sensation normal no skin breakdown Results for orders placed in visit on 03/18/12  POCT GLYCOSYLATED HEMOGLOBIN (HGB A1C)      Component Value Range   Hemoglobin A1C 6.0    GLUCOSE, POCT (MANUAL RESULT ENTRY)      Component Value Range   POC Glucose 96  70 - 99 mg/dl         Assessment & Plan:  ` No change in medications at the present time recheck 4 months

## 2012-03-23 NOTE — Progress Notes (Signed)
[facility]  High Risk Clinic Follow-up Visit  Name: Jessica Mendoza            Date: 03/23/2012 MRN: [id number]                DOB: [birth year]  CC:   REFERRING PHYSICIAN: Dr. Maisie Fus Cornett  REASON FOR VISIT:Patient is seen in followup. Her initial visit was with Colman Cater on 07/18/2011.  HISTORY OF PRESENT ILLNESS: Jessica Mendoza is a 57 y.o. female here for follow-up of hereditary cancer risk assessment. Overall patient is doing well she is without any significant complaints. On her last visit with our nurse practitioner Colman Cater patient was recommended to proceed with chemoprevention with tamoxifen. However up today patient has not started it. And she plans on starting it at all. 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 And she does have a probable increase risk of developing a breast cancer or another high risk lesion. Certainly her risk would include atypical ductal hyperplasia ductal carcinoma in situ or even invasive ductal carcinoma.   CURRENT MEDICATIONS: Ms. Salih had no medications administered during this visit.  ALLERGIES: Simvastatin  SOCIAL HISTORY:  reports that she quit smoking about 21 years ago. She does not have any smokeless tobacco history on file. She reports that  drinks alcohol. She reports that she does not use illicit drugs.  CHANGE IN FAMILY HISTORY:    HEALTH MAINTENANCE: Last mammogram: Last clinical breast exam: Performs self breast exam: Last Pap Smear: Colonoscopy:  Last skin exam:  REVIEW OF SYSTEMS:  General: Negative for fever, chills, night sweats,  loss of appetite or weight loss. HEENT: Negative for headaches, sore  throat, difficulty swallowing, blurred  vision or problem with hearing or  sinus congestion. Respiratory: Negative for shortness of breath, cough  or dyspnea on exertion. Cardiovascular: Negative for chest pain,  palpitations or pedal edema. GI: Negative for nausea, vomiting,  diarrhea, constipation, change in bowel habits or blood in the stool.  No jaundice. GU: Negative for painful or frequent urination, change in  color of urine, or decreased urinary stream. Integumentary: Negative  for skin rashes or other suspicious skin lesions. Hematologic: Negative  for easy bruisability or bleeding. Musculoskeletal: Negative for  complaints of pain, arthralgias, arthritis or myalgias.  Neurological/psychiatric: Negative for numbness, focal weakness,  balance problems or coordination difficulties. No depression or mood swings.  Breast: No self detected abnormalities in the breast. No nipple discharge, masses or redness of the skin.   PHYSICAL EXAM: BP 114/72  Pulse 71  Temp(Src) 97.7 F (36.5 C) (Oral)  Resp 20  Ht 5\' 6"  (1.676 m)  Wt 213 lb 12.8 oz (96.979 kg)  BMI 34.52 kg/m2 GENERAL: Well developed, well nourished, in no acute distress.  EENT: No ocular or oral lesions. No stomatitis.  RESPIRATORY: Lungs are clear to auscultation bilaterally with normal respiratory movement and no accessory muscle use. CARDIAC: No murmur, rub or tachycardia. No upper or lower extremity edema.  GI: Abdomen is soft, no palpable hepatosplenomegaly. No fluid wave. No tenderness. Musculoskeletal: No kyphosis, no tenderness over the spine, ribs or hips. Lymph: No cervical, infraclavicular, axillary or inguinal adenopathy. Neuro: No focal neurological deficits. Psych: Alert and oriented X 3, appropriate mood and affect.  BREAST  EXAM: In the supine position, with the right arm over the head, right nipple is everted. No periareolar edema or nipple discharge. No mass in any quadrant or subareolar region. No redness of the skin. No right axillary  adenopathy. With the left arm over the head, left nipple is everted. No periareolar edema or nipple discharge. No mass in any quadrant or subareolar region. No redness of the skin. No left axillary adenopathy.    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 All margins were negative for disease. Postoperatively patient is doing well. On her prior visit in the high-risk clinic she was recommended chemoprevention with tamoxifen. She understands the rationale. She has done a lot of reading. However at this time patient is still not sure whether she wants to go on tamoxifen or not. She would like to have her mammograms as scheduled she will do self breast examinations as well as proceed with clinical examinations by her GYN.  PLAN:   patient will now continue to follow up with her primary care physician as well as her gynecologist. I have recommended she exercise eat a healthy and do self breast examinations. Certainly if she changes her mind about chemoprevention I. Can see her back. Otherwise I will see her on an as-needed basis.  Drue Second, MD Medical/Oncology [facility] [phone] (beeper) [phone] (Office)

## 2012-05-01 ENCOUNTER — Telehealth: Payer: Self-pay | Admitting: Radiology

## 2012-05-01 NOTE — Telephone Encounter (Signed)
Patient needed extra views after her mammogram. Called her to advise. Left message for her to call me back and advise.

## 2012-05-05 ENCOUNTER — Other Ambulatory Visit: Payer: Self-pay | Admitting: Obstetrics & Gynecology

## 2012-05-07 NOTE — Telephone Encounter (Signed)
Called again.  Left message for call back.   

## 2012-05-08 NOTE — Telephone Encounter (Signed)
Unable to reach letter sent

## 2012-05-15 ENCOUNTER — Encounter: Payer: Self-pay | Admitting: Emergency Medicine

## 2012-05-28 ENCOUNTER — Other Ambulatory Visit: Payer: Self-pay | Admitting: Emergency Medicine

## 2012-06-03 ENCOUNTER — Ambulatory Visit (INDEPENDENT_AMBULATORY_CARE_PROVIDER_SITE_OTHER): Payer: 59 | Admitting: Family Medicine

## 2012-06-03 ENCOUNTER — Ambulatory Visit: Payer: 59

## 2012-06-03 ENCOUNTER — Telehealth: Payer: Self-pay | Admitting: *Deleted

## 2012-06-03 VITALS — BP 118/64 | HR 67 | Temp 98.1°F | Resp 16 | Ht 66.0 in | Wt 210.0 lb

## 2012-06-03 DIAGNOSIS — J029 Acute pharyngitis, unspecified: Secondary | ICD-10-CM

## 2012-06-03 DIAGNOSIS — Z7901 Long term (current) use of anticoagulants: Secondary | ICD-10-CM

## 2012-06-03 DIAGNOSIS — I4891 Unspecified atrial fibrillation: Secondary | ICD-10-CM

## 2012-06-03 DIAGNOSIS — I499 Cardiac arrhythmia, unspecified: Secondary | ICD-10-CM

## 2012-06-03 LAB — BASIC METABOLIC PANEL
BUN: 12 mg/dL (ref 6–23)
CO2: 28 meq/L (ref 19–32)
Calcium: 9.5 mg/dL (ref 8.4–10.5)
Chloride: 101 mEq/L (ref 96–112)
Creat: 0.94 mg/dL (ref 0.50–1.10)
Glucose, Bld: 100 mg/dL — ABNORMAL HIGH (ref 70–99)
Potassium: 4.1 mEq/L (ref 3.5–5.3)
Sodium: 139 mEq/L (ref 135–145)

## 2012-06-03 LAB — POCT CBC
Granulocyte percent: 57.2 %G (ref 37–80)
HCT, POC: 38.2 % (ref 37.7–47.9)
Hemoglobin: 11.9 g/dL — AB (ref 12.2–16.2)
Lymph, poc: 2.3 (ref 0.6–3.4)
MCH, POC: 26.8 pg — AB (ref 27–31.2)
MCHC: 31.2 g/dL — AB (ref 31.8–35.4)
MCV: 86.1 fL (ref 80–97)
MID (cbc): 0.7 (ref 0–0.9)
MPV: 9.9 fL (ref 0–99.8)
POC Granulocyte: 4.1 (ref 2–6.9)
POC LYMPH PERCENT: 32.4 % (ref 10–50)
POC MID %: 10.4 %M (ref 0–12)
Platelet Count, POC: 244 10*3/uL (ref 142–424)
RBC: 4.44 M/uL (ref 4.04–5.48)
RDW, POC: 16.1 %
WBC: 7.1 10*3/uL (ref 4.6–10.2)

## 2012-06-03 LAB — TSH: TSH: 1.298 u[IU]/mL (ref 0.350–4.500)

## 2012-06-03 LAB — POCT RAPID STREP A (OFFICE): Rapid Strep A Screen: NEGATIVE

## 2012-06-03 MED ORDER — RIVAROXABAN 20 MG PO TABS
20.0000 mg | ORAL_TABLET | Freq: Every day | ORAL | Status: DC
Start: 2012-06-03 — End: 2012-09-04

## 2012-06-03 NOTE — Patient Instructions (Addendum)
Stop taking your aspirin for the time being.  I will call you later on today with your cardiology appt and instructions for your blood thinner. We are going to have you take xarelto assuming your renal function is ok.  You have a an upper respiratory infection- likely viral.  This should get better over the next couple of days.  Avoid taking decongestants such as sudafed.

## 2012-06-03 NOTE — Progress Notes (Signed)
[facility] [address] [place] [address] [phone]  Date:  06/03/2012   Name:  Jessica Mendoza   DOB:  05/12/1955   MRN:  [id number]  PCP:  Lucilla Edin, MD    Chief Complaint: Sore Throat and Cough   History of Present Illness:  Jessica Mendoza is a 57 y.o. very pleasant female patient who presents with the following:  She has noted a ST for about 3 days.  No fever.  She has tried salt water gargles and some other home remedies.  She also has a cough- non productive.  She is around her 47 year old grandson frequently.   She has noted mild aches and chills.  She does not note any runny, stuffy nose or itchy eyes.  She has felt nauseated, but has not had vomiting or diarrhea. She is eating normally   Most recent A1c in 2/14 was 6%.  History of HTN and NIDDM.  Also history of hypothyroidism.  She is taking metformin, norvasc/ hyzaar/ toprol, crestor and synthroid as well as asa 81 mg, fish oil and vitamins  Upon ascultation noted an irregular cardiac rhythm. She then admitted to noting some palpitations and SOB for the last month or so.   She has never been diagnosed with any cardiac issues or arrythmia in the past.  No chest pain.  Denies any history of GI bleeding or other unusual bleeding.    She is post- menopausal and states there is no change of pregnancy  Patient Active Problem List  Diagnosis  . Diabetes mellitus, type 2  . Essential hypertension, benign  . Other and unspecified hyperlipidemia  . Migraines  . Colon polyps  . Anemia  . Benign positional vertigo  . Goiter  . Atypical ductal hyperplasia of breast  . Hypothyroidism    Past Medical History  Diagnosis Date  . Diabetes mellitus, type 2 06/2010  . Essential hypertension, benign   . Other and unspecified hyperlipidemia   . Migraines     menstrual  . Colon polyps 12/2005  . Focal nodular hyperplasia of liver     bx 2002  . Benign positional vertigo   . Goiter 10/2006   . Hypothyroidism   . Arthritis     WRISTS AND NECK  . Glaucoma     Past Surgical History  Procedure Laterality Date  . Liver biopsy  2002  . Breast lumpectomy      right breast- benign    History  Substance Use Topics  . Smoking status: Former Smoker -- 25 years    Quit date: 02/13/1991  . Smokeless tobacco: Not on file  . Alcohol Use: No     Comment: occas    Family History  Problem Relation Age of Onset  . Hypertension Mother   . Heart attack Mother   . Alcohol abuse Father   . Hypertension Brother   . Hyperlipidemia Brother   . Ovarian cancer Maternal Aunt   . Cancer Maternal Aunt     Uterine cancer  . Pancreatic cancer Maternal Grandfather   . Cancer Maternal Grandfather     Pancreatic    Allergies  Allergen Reactions  . Simvastatin     Myalgias  ?? lipitor     Medication list has been reviewed and updated.  Current Outpatient Prescriptions on File Prior to Visit  Medication Sig Dispense Refill  . amLODipine (NORVASC) 10 MG tablet Take 1 tablet (10 mg total) by mouth  daily. NEED REFILLS  30 tablet  11  . aspirin 81 MG tablet Take 81 mg by mouth daily.      . Coenzyme Q10 (CO Q 10) 100 MG CAPS Take by mouth.      . Fish Oil-Cholecalciferol (FISH OIL + D3 PO) Take by mouth.      . levothyroxine (SYNTHROID, LEVOTHROID) 25 MCG tablet Take 25 mcg by mouth daily. NEED REFILLS      . losartan-hydrochlorothiazide (HYZAAR) 100-12.5 MG per tablet Take 1 tablet by mouth daily. NEED REFILLS  30 tablet  11  . metFORMIN (GLUCOPHAGE-XR) 500 MG 24 hr tablet Take 1 tablet (500 mg total) by mouth daily with breakfast. Needs office visit/labs  60 tablet  2  . metoprolol succinate (TOPROL-XL) 100 MG 24 hr tablet Take 1 tablet (100 mg total) by mouth daily. NEED REFILLS  30 tablet  11  . Multiple Vitamins-Minerals (WOMENS 50+ MULTI VITAMIN/MIN) TABS Take by mouth daily.      . Omega-3 Fatty Acids (FISH OIL) 1200 MG CAPS Take by mouth daily.      . rosuvastatin (CRESTOR) 10  MG tablet Take 1 tablet (10 mg total) by mouth daily.  30 tablet  11  . Cholecalciferol 50000 UNITS TABS Take 50,000 Int'l Units by mouth once a week.  4 tablet  11  . vitamin C (ASCORBIC ACID) 500 MG tablet Take 500 mg by mouth daily.       No current facility-administered medications on file prior to visit.    Review of Systems:  As per HPI- otherwise negative.   Physical Examination: Filed Vitals:   06/03/12 1007  BP: 118/64  Pulse: 67  Temp: 98.1 F (36.7 C)  Resp: 16   Filed Vitals:   06/03/12 1007  Height: 5\' 6"  (1.676 m)  Weight: 210 lb (95.255 kg)   Body mass index is 33.91 kg/(m^2). Ideal Body Weight: Weight in (lb) to have BMI = 25: 154.6  GEN: WDWN, NAD, Non-toxic, A & O x 3, overweight, looks well HEENT: Atraumatic, Normocephalic. Neck supple. No masses, No LAD.  Bilateral TM wnl, oropharynx minimally injected, no exudate.  Nasal cavity congested.  PEERL,EOMI.   Ears and Nose: No external deformity. CV: irregularly irregular, No M/G/R. No JVD. No thrill. No extra heart sounds. PULM: CTA B, no wheezes, crackles, rhonchi. No retractions. No resp. distress. No accessory muscle use. ABD: S, NT, ND, +BS. No rebound. No HSM. EXTR: No c/c/e NEURO Normal gait.  PSYCH: Normally interactive. Conversant. Not depressed or anxious appearing.  Calm demeanor.   EKG: atrial fibrillation with normal ventricular response.  Rate in the 70s   [facility] reading (PRIMARY) by  Dr. Patsy Lager. CXR:  Negative CHEST - 2 VIEW  Comparison: None.  Findings: Lungs clear. Heart is mildly enlarged with normal pulmonary vascularity. No adenopathy. No bone lesions. There is mild thoracolumbar levoscoliosis.  IMPRESSION: Mild cardiac enlargement. No edema or consolidation.  Called and discussed with Dr. Patty Sermons at [facility].  New onset/ first identified a- fib with normal ventricular response. CHADS2 score of 2 as she has HTN and DM.  Will check renal function and start xarelto, check her TSH.   Will plan to have her seen at [facility] cardiology as soon as possible.   Results for orders placed in visit on 06/03/12  POCT RAPID STREP A (OFFICE)      Result Value Range   Rapid Strep A Screen Negative  Negative    Assessment and Plan: Acute pharyngitis -  Plan: POCT rapid strep A  Irregular heart beat - Plan: EKG 12-Lead, DG Chest 2 View  Atrial fibrillation - Plan: Rivaroxaban (XARELTO) 20 MG TABS, POCT CBC, Basic metabolic panel, TSH, Ambulatory referral to Cardiology  Long term (current) use of anticoagulants - Plan: Protime-INR  Jessica Mendoza is seen today for a sore throat- suspect viral URI.  Discussed conservative and supportive management.  If not better in the next few days please let me know- Sooner if worse.   Incidental finding of atrial fibrillation with normal ventricular response today.  Discussed in detail with patient.  Plan to have her follow- up with cardiology- appt made to see Dr. Clide Cliff early next month.   Discussed anticoagulation to prevent stroke.  She is comfortable using xarelto- understands lack of reversibility but prefers for ease of use. Calculated creatinine clearance at 72.8 today- ok to use xarelto at usual dosage of 20 mg a day.  Stop aspirin while on xarelto.    Await other labs (TSH).  She is to report any chest pain or other issues in the meantime.     Meds ordered this encounter  Medications  . UNABLE TO FIND    Sig: 1,000 Units daily. VIT D3  . Rivaroxaban (XARELTO) 20 MG TABS    Sig: Take 1 tablet (20 mg total) by mouth daily.    Dispense:  30 tablet    Refill:  2    Signed Abbe Amsterdam, MD  Results for orders placed in visit on 06/03/12  BASIC METABOLIC PANEL      Result Value Range   Sodium 139  135 - 145 mEq/L   Potassium 4.1  3.5 - 5.3 mEq/L   Chloride 101  96 - 112 mEq/L   CO2 28  19 - 32 mEq/L   Glucose, Bld 100 (*) 70 - 99 mg/dL   BUN 12  6 - 23 mg/dL   Creat 5.28  4.13 - 2.44 mg/dL   Calcium 9.5  8.4 - 01.0 mg/dL  POCT  RAPID STREP A (OFFICE)      Result Value Range   Rapid Strep A Screen Negative  Negative  POCT CBC      Result Value Range   WBC 7.1  4.6 - 10.2 K/uL   Lymph, poc 2.3  0.6 - 3.4   POC LYMPH PERCENT 32.4  10 - 50 %L   MID (cbc) 0.7  0 - 0.9   POC MID % 10.4  0 - 12 %M   POC Granulocyte 4.1  2 - 6.9   Granulocyte percent 57.2  37 - 80 %G   RBC 4.44  4.04 - 5.48 M/uL   Hemoglobin 11.9 (*) 12.2 - 16.2 g/dL   HCT, POC 27.2  53.6 - 47.9 %   MCV 86.1  80 - 97 fL   MCH, POC 26.8 (*) 27 - 31.2 pg   MCHC 31.2 (*) 31.8 - 35.4 g/dL   RDW, POC 64.4     Platelet Count, POC 244  142 - 424 K/uL   MPV 9.9  0 - 99.8 fL

## 2012-06-03 NOTE — Telephone Encounter (Signed)
Message copied by Burnell Blanks on Tue Jun 03, 2012  6:11 PM ------      Message from: Cassell Clement      Created: Tue Jun 03, 2012  5:16 PM                   ----- Message -----         From: Pearline Cables, MD         Sent: 06/03/2012   2:32 PM           To: Cassell Clement, MD            Dear Dr. Patty Sermons,            Thanks for your help with Ms. Lafoy earlier today- it is much appreciated.  The earliest appt I was able to get for follow- up is on May 6th.  I wanted to be sure you feel this is soon enough, and ask if I should order an echo or any other preliminary testing in the meantime.  Also, it turns out she had a pre- cancerous breast lesion treated with lumpectomy but not breast cancer.            Warm regards,            Jessica Copland             ------

## 2012-06-03 NOTE — Telephone Encounter (Signed)
Discussed with  Dr. Patty Sermons and patient seeing Dr Graciela Husbands 5/6, would be a good to get echo prior to visit. Tried to call patient, no answer. Will try again

## 2012-06-04 ENCOUNTER — Encounter: Payer: Self-pay | Admitting: Family Medicine

## 2012-06-04 LAB — PROTIME-INR
INR: 0.97 (ref <1.50)
Prothrombin Time: 12.9 seconds (ref 11.6–15.2)

## 2012-06-05 NOTE — Telephone Encounter (Signed)
Spoke with Jasmine December in [facility] and patient has been scheduled for echo. Patient did reschedule her consult secondary to being out of town. Will make Dr Dallas Schimke aware of consult change.

## 2012-06-08 ENCOUNTER — Telehealth: Payer: Self-pay | Admitting: Family Medicine

## 2012-06-08 NOTE — Telephone Encounter (Signed)
Called to discuss with her- she has rescheduled her appt with cardiology. She has an echo on 06/23/12.  She is feeling well.  Asked her to let me know if any problems with palpitations, etc.  She will do so

## 2012-06-08 NOTE — Telephone Encounter (Signed)
Message copied by Pearline Cables on Sun Jun 08, 2012  3:47 PM ------      Message from: Regis Bill B      Created: Thu Jun 05, 2012  9:05 AM       Good morning Dr Dallas Schimke,             You spoke with  Dr. Patty Sermons on Tuesday regarding Avelynn Sellin and her new onset A Fib. I just wanted to make you aware that she is scheduled for an echo but did reschedule her consult. She is going to be out of town per [facility]. If you have any other questions or concerns feel free to contact us.             Regis Bill, LPN ------

## 2012-06-17 ENCOUNTER — Encounter: Payer: Self-pay | Admitting: Emergency Medicine

## 2012-06-17 ENCOUNTER — Institutional Professional Consult (permissible substitution): Payer: 59 | Admitting: Internal Medicine

## 2012-06-23 ENCOUNTER — Ambulatory Visit (HOSPITAL_COMMUNITY): Payer: 59 | Attending: Internal Medicine | Admitting: Radiology

## 2012-06-23 DIAGNOSIS — I10 Essential (primary) hypertension: Secondary | ICD-10-CM | POA: Insufficient documentation

## 2012-06-23 DIAGNOSIS — E039 Hypothyroidism, unspecified: Secondary | ICD-10-CM | POA: Insufficient documentation

## 2012-06-23 DIAGNOSIS — I4891 Unspecified atrial fibrillation: Secondary | ICD-10-CM | POA: Insufficient documentation

## 2012-06-23 DIAGNOSIS — E119 Type 2 diabetes mellitus without complications: Secondary | ICD-10-CM | POA: Insufficient documentation

## 2012-06-23 DIAGNOSIS — Z87891 Personal history of nicotine dependence: Secondary | ICD-10-CM | POA: Insufficient documentation

## 2012-06-23 NOTE — Progress Notes (Signed)
Echocardiogram performed.  

## 2012-07-02 ENCOUNTER — Telehealth: Payer: Self-pay | Admitting: Internal Medicine

## 2012-07-02 ENCOUNTER — Encounter: Payer: Self-pay | Admitting: *Deleted

## 2012-07-02 ENCOUNTER — Encounter: Payer: Self-pay | Admitting: Internal Medicine

## 2012-07-02 ENCOUNTER — Ambulatory Visit (INDEPENDENT_AMBULATORY_CARE_PROVIDER_SITE_OTHER): Payer: 59 | Admitting: Internal Medicine

## 2012-07-02 VITALS — BP 111/71 | HR 85 | Ht 65.0 in | Wt 213.0 lb

## 2012-07-02 DIAGNOSIS — G4733 Obstructive sleep apnea (adult) (pediatric): Secondary | ICD-10-CM | POA: Insufficient documentation

## 2012-07-02 DIAGNOSIS — I4891 Unspecified atrial fibrillation: Secondary | ICD-10-CM | POA: Insufficient documentation

## 2012-07-02 DIAGNOSIS — R079 Chest pain, unspecified: Secondary | ICD-10-CM

## 2012-07-02 NOTE — Telephone Encounter (Signed)
Spoke with pt, she is asking about the DCCV procedure that was scheduled for her today. She is currently driving and will call me back tomorrow after she gets home from work.

## 2012-07-02 NOTE — Assessment & Plan Note (Signed)
She is atrial fibrillation with a modest increase in heart rate associated with exercise, i.e. Leg lifts. For dyspnea on exertion chest discomfort and dizziness may also be associated with atrial fibrillation. Flutters from family history of coronary disease I worry about cooccuring coronary artery disease.every week stress testing.  As relates to atrial fibrillation, from a blood risk profile has a CHADS2 score of 2 and a CHADS-VASc score of 3. Long-term anticoagulation is indicated. This is been appropriately prescribed; she has been on Rivaroxaban now for 4 weeks. We'll undertake cardioversion. She has modest left atrial enlargement and will need to wait and see how long it is that she is able to hold sinus rhythm at if we can further implicate atrial fibrillation as the cause of her aforementioned symptoms. We discussed only briefly the issue of long-term rhythm control as an alternative to rate control. The first effort rhythm control is obviously cardioversion.  For her multiple comorbidities which may be contributory atrial fibrillation. She has symptoms consistent with sleep apnea. She will need a sleep study. She is hypertension and we'll need to continue their be for this. There is evidence of hypertensive heart disease.

## 2012-07-02 NOTE — Addendum Note (Signed)
Addended by: Lisabeth Devoid F on: 07/02/2012 04:17 PM   Modules accepted: Orders

## 2012-07-02 NOTE — Assessment & Plan Note (Signed)
As above  We'll undertake stress testing following her cardioversion.

## 2012-07-02 NOTE — Patient Instructions (Addendum)
Your physician has recommended that you have a Cardioversion (DCCV). Electrical Cardioversion uses a jolt of electricity to your heart either through paddles or wired patches attached to your chest. This is a controlled, usually prescheduled, procedure. Defibrillation is done under light anesthesia in the hospital, and you usually go home the day of the procedure. This is done to get your heart back into a normal rhythm. You are not awake for the procedure. Please see the instruction sheet given to you today.  Your physician has recommended that you have a sleep study. This test records several body functions during sleep, including: brain activity, eye movement, oxygen and carbon dioxide blood levels, heart rate and rhythm, breathing rate and rhythm, the flow of air through your mouth and nose, snoring, body muscle movements, and chest and belly movement.  Your physician has requested that you have en exercise stress myoview AFTER Cardioversion. For further information please visit [email or web address] Please follow instruction sheet, as given.   Your physician recommends that you schedule a follow-up appointment in: 2 weeks with Dr. Graciela Husbands AFTER your Cardioversion  Your physician recommends that you continue on your current medications as directed. Please refer to the Current Medication list given to you today.

## 2012-07-02 NOTE — Assessment & Plan Note (Signed)
As above.

## 2012-07-02 NOTE — Telephone Encounter (Signed)
Follow Up      Pt is following up on phone call from earlier. Please call back.

## 2012-07-02 NOTE — Progress Notes (Signed)
ELECTROPHYSIOLOGY CONSULT NOTE  Patient ID: Jessica Mendoza, MRN: [id number], DOB/AGE: 58/18/1957 57 y.o. Admit date: (Not on file) Date of Consult: 07/02/2012  Primary Physician: Lucilla Edin, MD Primary Cardiologist:   Chief Complaint: atrial fibrillation    HPI Jessica Mendoza is a 57 y.o. female   referred for treatment options related to atrial fibrillation. This was identified in the context of presenting with URI symptoms. She noted some difficulty with lightheadedness as well and exertional dyspnea. This occurred in the context of chronic edema and some effort associated chest discomfort described as a heaviness going into the jaw it was relieved by rest. She specifically had no associated palpitations   Evaluation that day she was found to be in atrial fibrillation and Rivaroxaban was started. She has tolerated this without significant problems. Beta blockers were also started and added to her complex antihypertensive regime.  Blood work obtained at that visit demonstrated a normal TSH essentially normal hemogram with a borderline low hemoglobin and normal electrolytes.  Her related medical history is notable exertional chest discomfort as noted above with a family history of her mother dying at age 3 of "heart attack" in addition she has symptoms consistent with sleep apnea with obstructive breathing patterns at night daytime fatigue and daytime somnolence.  His chronic peripheral edema without orthopnea or nocturnal dyspnea. She's had no syncope.  She's been diagnosed as diabetic. She is on therapy. She exercises 3 days a week.     Echo 2014 demonstrated normal left ventricular function mild left ventricular hypertrophy mild left atrial enlargement   Past Medical History  Diagnosis Date  . Diabetes mellitus, type 2 06/2010  . Essential hypertension, benign   . Other and unspecified hyperlipidemia   . Migraines     menstrual  . Colon polyps 12/2005  . Focal  nodular hyperplasia of liver     bx 2002  . Benign positional vertigo   . Goiter 10/2006  . Hypothyroidism   . Arthritis     WRISTS AND NECK  . Glaucoma       Surgical History:  Past Surgical History  Procedure Laterality Date  . Liver biopsy  2002  . Breast lumpectomy      right breast- benign     Home Meds: Prior to Admission medications   Medication Sig Start Date End Date Taking? Authorizing Provider  amLODipine (NORVASC) 10 MG tablet Take 1 tablet (10 mg total) by mouth daily. NEED REFILLS 03/18/12  Yes Collene Gobble, MD  Coenzyme Q10 (CO Q 10) 100 MG CAPS Take by mouth.   Yes Historical Provider, MD  Fish Oil-Cholecalciferol (FISH OIL + D3 PO) Take by mouth.   Yes Historical Provider, MD  levothyroxine (SYNTHROID, LEVOTHROID) 25 MCG tablet Take 25 mcg by mouth daily. NEED REFILLS   Yes Historical Provider, MD  losartan-hydrochlorothiazide (HYZAAR) 100-12.5 MG per tablet Take 1 tablet by mouth daily. NEED REFILLS 03/18/12  Yes Collene Gobble, MD  metFORMIN (GLUCOPHAGE-XR) 500 MG 24 hr tablet Take 1 tablet (500 mg total) by mouth daily with breakfast. Needs office visit/labs 05/28/12  Yes Nelva Nay, PA-C  metoprolol succinate (TOPROL-XL) 100 MG 24 hr tablet Take 1 tablet (100 mg total) by mouth daily. NEED REFILLS 03/18/12  Yes Collene Gobble, MD  Multiple Vitamins-Minerals (WOMENS 50+ MULTI VITAMIN/MIN) TABS Take by mouth daily.   Yes Historical Provider, MD  Rivaroxaban (XARELTO) 20 MG TABS Take 1 tablet (20 mg total) by mouth daily. 06/03/12  Yes Gwenlyn Found Copland, MD  rosuvastatin (CRESTOR) 10 MG tablet Take 1 tablet (10 mg total) by mouth daily. 03/18/12  Yes Collene Gobble, MD  vitamin C (ASCORBIC ACID) 500 MG tablet Take 500 mg by mouth daily.   Yes Historical Provider, MD      Allergies:  Allergies  Allergen Reactions  . Simvastatin     Myalgias  ?? lipitor     History   Social History  . Marital Status: Married    Spouse Name: N/A    Number of Children: 1  .  Years of Education: N/A   Occupational History  . [employer]    Social History Main Topics  . Smoking status: Former Smoker -- 25 years    Quit date: 02/13/1991  . Smokeless tobacco: Not on file  . Alcohol Use: No     Comment: occas  . Drug Use: No  . Sexually Active: Yes    Birth Control/ Protection: None   Other Topics Concern  . Not on file   Social History Narrative  . No narrative on file     Family History  Problem Relation Age of Onset  . Hypertension Mother   . Heart attack Mother   . Alcohol abuse Father   . Hypertension Brother   . Hyperlipidemia Brother   . Ovarian cancer Maternal Aunt   . Cancer Maternal Aunt     Uterine cancer  . Pancreatic cancer Maternal Grandfather   . Cancer Maternal Grandfather     Pancreatic     ROS:  Please see the history of present illness. Apart from the addition for the past medical history is reportedly negative      Physical Exam:   Blood pressure 111/71, pulse 85, height 5\' 5"  (1.651 m), weight 213 lb (96.616 kg). General: Well developed, well nourished age appearing African American female in no acute distress. Head: Normocephalic, atraumatic, sclera non-icteric, no xanthomas, nares are without discharge. EENT: normal Lymph Nodes:  none Back: without scoliosis/kyphosis , no CVA tendersness Neck: Negative for carotid bruits. JVD not elevated. Lungs: Clear bilaterally to auscultation without wheezes, rales, or rhonchi. Breathing is unlabored. Heart: irregularly irregular rhythm that increase with leg lefits  n0  murmur , rubs, or gallops appreciated. Abdomen: Soft, non-tender, non-distended with normoactive bowel sounds. No hepatomegaly. No rebound/guarding. No obvious abdominal masses. Msk:  Strength and tone appear normal for age. Extremities: No clubbing or cyanosis. 1+ edema.  Distal pedal pulses are 2+ and equal bilaterally. Skin: Warm and Dry Neuro: Alert and oriented X 3. CN III-XII intact Grossly  normal sensory and motor function . Psych:  Responds to questions appropriately with a normal affect.      Labs: Cardiac Enzymes No results found for this basename: CKTOTAL, CKMB, TROPONINI,  in the last 72 hours CBC Lab Results  Component Value Date   WBC 7.1 06/03/2012   HGB 11.9* 06/03/2012   HCT 38.2 06/03/2012   MCV 86.1 06/03/2012   PLT 266 03/18/2012   PROTIME: No results found for this basename: LABPROT, INR,  in the last 72 hours Chemistry No results found for this basename: NA, K, CL, CO2, BUN, CREATININE, CALCIUM, LABALBU, PROT, BILITOT, ALKPHOS, ALT, AST, GLUCOSE,  in the last 168 hours Lipids Lab Results  Component Value Date   CHOL 185 03/18/2012   HDL 49 03/18/2012   LDLCALC 98 03/18/2012   TRIG 189* 03/18/2012   BNP No results found for this basename: probnp  Miscellaneous No results found for this basename: DDIMER     EKG: ECG demonstrates atrial fibrillation at 65 Intervals-/08/43   Assessment and Plan:  and  Sherryl Manges  of

## 2012-07-03 ENCOUNTER — Telehealth: Payer: Self-pay | Admitting: *Deleted

## 2012-07-03 NOTE — Telephone Encounter (Signed)
Staff message sent to [facility] to schedule the patient on 6/13 at 9:00 am with Dr. Graciela Husbands.

## 2012-07-03 NOTE — Telephone Encounter (Signed)
Spoke with pt, aware of DCCV instructions.

## 2012-07-03 NOTE — Telephone Encounter (Signed)
I spoke with pt this morning about scheduled DCCV for 07/11/12.  Reviewed instructions with pt. She will come in for lab work on 07/08/12 Instructions mailed to pt.    She will need a 2 week follow-up with Dr. Graciela Husbands after DCCV per Dr. Graciela Husbands.    Mylo Red RN

## 2012-07-08 ENCOUNTER — Other Ambulatory Visit (INDEPENDENT_AMBULATORY_CARE_PROVIDER_SITE_OTHER): Payer: 59

## 2012-07-08 DIAGNOSIS — R079 Chest pain, unspecified: Secondary | ICD-10-CM

## 2012-07-08 DIAGNOSIS — I4891 Unspecified atrial fibrillation: Secondary | ICD-10-CM

## 2012-07-08 LAB — CBC WITH DIFFERENTIAL/PLATELET
Basophils Absolute: 0 10*3/uL (ref 0.0–0.1)
Basophils Relative: 0.4 % (ref 0.0–3.0)
Eosinophils Absolute: 0.2 10*3/uL (ref 0.0–0.7)
Eosinophils Relative: 3.4 % (ref 0.0–5.0)
HCT: 38.6 % (ref 36.0–46.0)
Hemoglobin: 12.8 g/dL (ref 12.0–15.0)
Lymphocytes Relative: 47.7 % — ABNORMAL HIGH (ref 12.0–46.0)
Lymphs Abs: 2.8 10*3/uL (ref 0.7–4.0)
MCHC: 33.1 g/dL (ref 30.0–36.0)
MCV: 83.7 fl (ref 78.0–100.0)
Monocytes Absolute: 0.5 10*3/uL (ref 0.1–1.0)
Monocytes Relative: 8.2 % (ref 3.0–12.0)
Neutro Abs: 2.3 10*3/uL (ref 1.4–7.7)
Neutrophils Relative %: 40.3 % — ABNORMAL LOW (ref 43.0–77.0)
Platelets: 228 10*3/uL (ref 150.0–400.0)
RBC: 4.61 Mil/uL (ref 3.87–5.11)
RDW: 15.6 % — ABNORMAL HIGH (ref 11.5–14.6)
WBC: 5.8 10*3/uL (ref 4.5–10.5)

## 2012-07-08 LAB — BASIC METABOLIC PANEL
BUN: 14 mg/dL (ref 6–23)
CO2: 30 meq/L (ref 19–32)
Calcium: 8.8 mg/dL (ref 8.4–10.5)
Chloride: 101 mEq/L (ref 96–112)
Creatinine, Ser: 1 mg/dL (ref 0.4–1.2)
GFR: 76.87 mL/min (ref >60.00)
Glucose, Bld: 107 mg/dL — ABNORMAL HIGH (ref 70–99)
Potassium: 3.3 mEq/L — ABNORMAL LOW (ref 3.5–5.1)
Sodium: 137 mEq/L (ref 135–145)

## 2012-07-08 LAB — PROTIME-INR
INR: 1.4 ratio — ABNORMAL HIGH (ref 0.8–1.0)
Prothrombin Time: 14.2 s — ABNORMAL HIGH (ref 10.2–12.4)

## 2012-07-09 ENCOUNTER — Telehealth: Payer: Self-pay | Admitting: Internal Medicine

## 2012-07-09 NOTE — Telephone Encounter (Signed)
F/u   Pt wanted to know if procedure will keep her from flying

## 2012-07-09 NOTE — Telephone Encounter (Signed)
New Problem  Pt states she has some question to ask, but did not disclose any information.

## 2012-07-09 NOTE — Telephone Encounter (Signed)
Spoke with pt, aware her DCCV will not keep her from flying.

## 2012-07-10 ENCOUNTER — Telehealth: Payer: Self-pay | Admitting: *Deleted

## 2012-07-10 DIAGNOSIS — E876 Hypokalemia: Secondary | ICD-10-CM

## 2012-07-10 MED ORDER — POTASSIUM CHLORIDE CRYS ER 10 MEQ PO TBCR: EXTENDED_RELEASE_TABLET | ORAL | Status: DC

## 2012-07-10 NOTE — Telephone Encounter (Signed)
Per Dr. Graciela Husbands- start Potassium 10 meq two tablets BID x 2 days , then one tablet daily after that. Sherri Rad, RN, BSN  The patient is aware of her results and verbalizes understanding. Sherri Rad, RN, BSN

## 2012-07-11 ENCOUNTER — Ambulatory Visit (HOSPITAL_COMMUNITY)
Admission: RE | Admit: 2012-07-11 | Discharge: 2012-07-11 | Disposition: A | Discharge status: Home / Self Care | Payer: 59 | Source: Ambulatory Visit | Attending: Cardiology | Admitting: Cardiology

## 2012-07-11 ENCOUNTER — Ambulatory Visit (HOSPITAL_COMMUNITY): Payer: 59 | Admitting: Anesthesiology

## 2012-07-11 ENCOUNTER — Encounter (HOSPITAL_COMMUNITY)
Admission: RE | Disposition: A | Discharge status: Home / Self Care | Payer: Self-pay | Source: Ambulatory Visit | Attending: Cardiology

## 2012-07-11 ENCOUNTER — Encounter (HOSPITAL_COMMUNITY): Payer: Self-pay | Admitting: Anesthesiology

## 2012-07-11 ENCOUNTER — Encounter (HOSPITAL_COMMUNITY): Payer: Self-pay | Admitting: Cardiology

## 2012-07-11 DIAGNOSIS — Z7901 Long term (current) use of anticoagulants: Secondary | ICD-10-CM | POA: Insufficient documentation

## 2012-07-11 DIAGNOSIS — Z79899 Other long term (current) drug therapy: Secondary | ICD-10-CM | POA: Insufficient documentation

## 2012-07-11 DIAGNOSIS — I10 Essential (primary) hypertension: Secondary | ICD-10-CM | POA: Insufficient documentation

## 2012-07-11 DIAGNOSIS — H8110 Benign paroxysmal vertigo, unspecified ear: Secondary | ICD-10-CM | POA: Insufficient documentation

## 2012-07-11 DIAGNOSIS — Z8249 Family history of ischemic heart disease and other diseases of the circulatory system: Secondary | ICD-10-CM | POA: Insufficient documentation

## 2012-07-11 DIAGNOSIS — I4891 Unspecified atrial fibrillation: Secondary | ICD-10-CM

## 2012-07-11 DIAGNOSIS — Z888 Allergy status to other drugs, medicaments and biological substances status: Secondary | ICD-10-CM | POA: Insufficient documentation

## 2012-07-11 DIAGNOSIS — M129 Arthropathy, unspecified: Secondary | ICD-10-CM | POA: Insufficient documentation

## 2012-07-11 DIAGNOSIS — E039 Hypothyroidism, unspecified: Secondary | ICD-10-CM | POA: Insufficient documentation

## 2012-07-11 DIAGNOSIS — E785 Hyperlipidemia, unspecified: Secondary | ICD-10-CM | POA: Insufficient documentation

## 2012-07-11 DIAGNOSIS — E049 Nontoxic goiter, unspecified: Secondary | ICD-10-CM | POA: Insufficient documentation

## 2012-07-11 DIAGNOSIS — G4733 Obstructive sleep apnea (adult) (pediatric): Secondary | ICD-10-CM

## 2012-07-11 DIAGNOSIS — E119 Type 2 diabetes mellitus without complications: Secondary | ICD-10-CM | POA: Insufficient documentation

## 2012-07-11 DIAGNOSIS — R079 Chest pain, unspecified: Secondary | ICD-10-CM

## 2012-07-11 DIAGNOSIS — H409 Unspecified glaucoma: Secondary | ICD-10-CM | POA: Insufficient documentation

## 2012-07-11 DIAGNOSIS — Z87891 Personal history of nicotine dependence: Secondary | ICD-10-CM | POA: Insufficient documentation

## 2012-07-11 HISTORY — PX: CARDIOVERSION: SHX1299

## 2012-07-11 LAB — BASIC METABOLIC PANEL
BUN: 12 mg/dL (ref 6–23)
CO2: 26 meq/L (ref 19–32)
Calcium: 9.1 mg/dL (ref 8.4–10.5)
Chloride: 104 meq/L (ref 96–112)
Creatinine, Ser: 0.8 mg/dL (ref 0.50–1.10)
GFR calc Af Amer: >90 mL/min (ref >90)
GFR calc non Af Amer: 80 mL/min — ABNORMAL LOW (ref >90)
Glucose, Bld: 108 mg/dL — ABNORMAL HIGH (ref 70–99)
Potassium: 4 meq/L (ref 3.5–5.1)
Sodium: 141 meq/L (ref 135–145)

## 2012-07-11 LAB — GLUCOSE, CAPILLARY: Glucose-Capillary: 108 mg/dL — ABNORMAL HIGH (ref 70–99)

## 2012-07-11 SURGERY — CARDIOVERSION
Anesthesia: Monitor Anesthesia Care

## 2012-07-11 MED ORDER — SODIUM CHLORIDE 0.9 % IV SOLN: INTRAVENOUS | Status: DC

## 2012-07-11 MED ORDER — LIDOCAINE HCL (CARDIAC) 20 MG/ML IV SOLN
INTRAVENOUS | Status: DC | PRN
  Administered 2012-07-11: 20 mg via INTRAVENOUS

## 2012-07-11 MED ORDER — PROPOFOL 10 MG/ML IV BOLUS
INTRAVENOUS | Status: DC | PRN
  Administered 2012-07-11: 90 mg via INTRAVENOUS

## 2012-07-11 NOTE — Procedures (Signed)
Electrical Cardioversion Procedure Note Jessica Mendoza [id number] 09/17/1955  Procedure: Electrical Cardioversion Indications:  Atrial Fibrillation.  She has been on Xarelto > 1 month and has not missed a dose.   Procedure Details Consent: Risks of procedure as well as the alternatives and risks of each were explained to the (patient/caregiver).  Consent for procedure obtained. Time Out: Verified patient identification, verified procedure, site/side was marked, verified correct patient position, special equipment/implants available, medications/allergies/relevent history reviewed, required imaging and test results available.  Performed  Patient placed on cardiac monitor, pulse oximetry, supplemental oxygen as necessary.  Sedation given: Propofol IV per anesthesiology. Pacer pads placed anterior and posterior chest.  Cardioverted 1 time(s).  Cardioverted at 200J.  Evaluation Findings: Post procedure EKG shows: NSR Complications: None Patient did tolerate procedure well.   Marca Ancona 07/11/2012, 12:23 PM

## 2012-07-11 NOTE — H&P (View-Only) (Signed)
ELECTROPHYSIOLOGY CONSULT NOTE  Patient ID: Jessica Mendoza, MRN: [id number], DOB/AGE: 58/18/1957 57 y.o. Admit date: (Not on file) Date of Consult: 07/02/2012  Primary Physician: Lucilla Edin, MD Primary Cardiologist:   Chief Complaint: atrial fibrillation    HPI Jessica Mendoza is a 57 y.o. female   referred for treatment options related to atrial fibrillation. This was identified in the context of presenting with URI symptoms. She noted some difficulty with lightheadedness as well and exertional dyspnea. This occurred in the context of chronic edema and some effort associated chest discomfort described as a heaviness going into the jaw it was relieved by rest. She specifically had no associated palpitations   Evaluation that day she was found to be in atrial fibrillation and Rivaroxaban was started. She has tolerated this without significant problems. Beta blockers were also started and added to her complex antihypertensive regime.  Blood work obtained at that visit demonstrated a normal TSH essentially normal hemogram with a borderline low hemoglobin and normal electrolytes.  Her related medical history is notable exertional chest discomfort as noted above with a family history of her mother dying at age 3 of "heart attack" in addition she has symptoms consistent with sleep apnea with obstructive breathing patterns at night daytime fatigue and daytime somnolence.  His chronic peripheral edema without orthopnea or nocturnal dyspnea. She's had no syncope.  She's been diagnosed as diabetic. She is on therapy. She exercises 3 days a week.     Echo 2014 demonstrated normal left ventricular function mild left ventricular hypertrophy mild left atrial enlargement   Past Medical History  Diagnosis Date  . Diabetes mellitus, type 2 06/2010  . Essential hypertension, benign   . Other and unspecified hyperlipidemia   . Migraines     menstrual  . Colon polyps 12/2005  . Focal  nodular hyperplasia of liver     bx 2002  . Benign positional vertigo   . Goiter 10/2006  . Hypothyroidism   . Arthritis     WRISTS AND NECK  . Glaucoma       Surgical History:  Past Surgical History  Procedure Laterality Date  . Liver biopsy  2002  . Breast lumpectomy      right breast- benign     Home Meds: Prior to Admission medications   Medication Sig Start Date End Date Taking? Authorizing Provider  amLODipine (NORVASC) 10 MG tablet Take 1 tablet (10 mg total) by mouth daily. NEED REFILLS 03/18/12  Yes Collene Gobble, MD  Coenzyme Q10 (CO Q 10) 100 MG CAPS Take by mouth.   Yes Historical Provider, MD  Fish Oil-Cholecalciferol (FISH OIL + D3 PO) Take by mouth.   Yes Historical Provider, MD  levothyroxine (SYNTHROID, LEVOTHROID) 25 MCG tablet Take 25 mcg by mouth daily. NEED REFILLS   Yes Historical Provider, MD  losartan-hydrochlorothiazide (HYZAAR) 100-12.5 MG per tablet Take 1 tablet by mouth daily. NEED REFILLS 03/18/12  Yes Collene Gobble, MD  metFORMIN (GLUCOPHAGE-XR) 500 MG 24 hr tablet Take 1 tablet (500 mg total) by mouth daily with breakfast. Needs office visit/labs 05/28/12  Yes Nelva Nay, PA-C  metoprolol succinate (TOPROL-XL) 100 MG 24 hr tablet Take 1 tablet (100 mg total) by mouth daily. NEED REFILLS 03/18/12  Yes Collene Gobble, MD  Multiple Vitamins-Minerals (WOMENS 50+ MULTI VITAMIN/MIN) TABS Take by mouth daily.   Yes Historical Provider, MD  Rivaroxaban (XARELTO) 20 MG TABS Take 1 tablet (20 mg total) by mouth daily. 06/03/12  Yes Gwenlyn Found Copland, MD  rosuvastatin (CRESTOR) 10 MG tablet Take 1 tablet (10 mg total) by mouth daily. 03/18/12  Yes Collene Gobble, MD  vitamin C (ASCORBIC ACID) 500 MG tablet Take 500 mg by mouth daily.   Yes Historical Provider, MD      Allergies:  Allergies  Allergen Reactions  . Simvastatin     Myalgias  ?? lipitor     History   Social History  . Marital Status: Married    Spouse Name: N/A    Number of Children: 1  .  Years of Education: N/A   Occupational History  . [employer]    Social History Main Topics  . Smoking status: Former Smoker -- 25 years    Quit date: 02/13/1991  . Smokeless tobacco: Not on file  . Alcohol Use: No     Comment: occas  . Drug Use: No  . Sexually Active: Yes    Birth Control/ Protection: None   Other Topics Concern  . Not on file   Social History Narrative  . No narrative on file     Family History  Problem Relation Age of Onset  . Hypertension Mother   . Heart attack Mother   . Alcohol abuse Father   . Hypertension Brother   . Hyperlipidemia Brother   . Ovarian cancer Maternal Aunt   . Cancer Maternal Aunt     Uterine cancer  . Pancreatic cancer Maternal Grandfather   . Cancer Maternal Grandfather     Pancreatic     ROS:  Please see the history of present illness. Apart from the addition for the past medical history is reportedly negative      Physical Exam:   Blood pressure 111/71, pulse 85, height 5\' 5"  (1.651 m), weight 213 lb (96.616 kg). General: Well developed, well nourished age appearing African American female in no acute distress. Head: Normocephalic, atraumatic, sclera non-icteric, no xanthomas, nares are without discharge. EENT: normal Lymph Nodes:  none Back: without scoliosis/kyphosis , no CVA tendersness Neck: Negative for carotid bruits. JVD not elevated. Lungs: Clear bilaterally to auscultation without wheezes, rales, or rhonchi. Breathing is unlabored. Heart: irregularly irregular rhythm that increase with leg lefits  n0  murmur , rubs, or gallops appreciated. Abdomen: Soft, non-tender, non-distended with normoactive bowel sounds. No hepatomegaly. No rebound/guarding. No obvious abdominal masses. Msk:  Strength and tone appear normal for age. Extremities: No clubbing or cyanosis. 1+ edema.  Distal pedal pulses are 2+ and equal bilaterally. Skin: Warm and Dry Neuro: Alert and oriented X 3. CN III-XII intact Grossly  normal sensory and motor function . Psych:  Responds to questions appropriately with a normal affect.      Labs: Cardiac Enzymes No results found for this basename: CKTOTAL, CKMB, TROPONINI,  in the last 72 hours CBC Lab Results  Component Value Date   WBC 7.1 06/03/2012   HGB 11.9* 06/03/2012   HCT 38.2 06/03/2012   MCV 86.1 06/03/2012   PLT 266 03/18/2012   PROTIME: No results found for this basename: LABPROT, INR,  in the last 72 hours Chemistry No results found for this basename: NA, K, CL, CO2, BUN, CREATININE, CALCIUM, LABALBU, PROT, BILITOT, ALKPHOS, ALT, AST, GLUCOSE,  in the last 168 hours Lipids Lab Results  Component Value Date   CHOL 185 03/18/2012   HDL 49 03/18/2012   LDLCALC 98 03/18/2012   TRIG 189* 03/18/2012   BNP No results found for this basename: probnp  Miscellaneous No results found for this basename: DDIMER     EKG: ECG demonstrates atrial fibrillation at 65 Intervals-/08/43   Assessment and Plan:  and  Sherryl Manges  of

## 2012-07-11 NOTE — Preoperative (Signed)
Beta Blockers   Reason not to administer Beta Blockers:received toprol last evening

## 2012-07-11 NOTE — Anesthesia Postprocedure Evaluation (Signed)
  Anesthesia Post-op Note  Patient: Jessica Mendoza  Procedure(s) Performed: Procedure(s): CARDIOVERSION (N/A)  Patient Location: Endoscopy Unit  Anesthesia Type:General  Level of Consciousness: awake, alert  and oriented  Airway and Oxygen Therapy: Patient Spontanous Breathing and Patient connected to nasal cannula oxygen  Post-op Pain: none  Post-op Assessment: Post-op Vital signs reviewed, Patient's Cardiovascular Status Stable, Respiratory Function Stable, Patent Airway and No signs of Nausea or vomiting  Post-op Vital Signs: Reviewed and stable  Complications: No apparent anesthesia complications

## 2012-07-11 NOTE — Interval H&P Note (Signed)
History and Physical Interval Note:  07/11/2012 12:15 PM  Jessica Mendoza  has presented today for surgery, with the diagnosis of a-fib  The various methods of treatment have been discussed with the patient and family. After consideration of risks, benefits and other options for treatment, the patient has consented to  Procedure(s): CARDIOVERSION (N/A) as a surgical intervention .  The patient's history has been reviewed, patient examined, no change in status, stable for surgery.  I have reviewed the patient's chart and labs.  Questions were answered to the patient's satisfaction.     [name] [employer]

## 2012-07-11 NOTE — Discharge Instructions (Addendum)
Sedation or General Anesthesia, Adult Care After  Refer to this sheet in the next 24 hours. These instructions provide you with information on caring for yourself after your procedure. Your caregiver may also give you more specific instructions. Your treatment has been planned according to current medical practices, but problems sometimes occur. Call your caregiver if you have any problems or questions after your procedure.  HOME CARE INSTRUCTIONS   Do not participate in any activities that require you to be alert or coordinated. Do not:  Drive.  Swim.  Ride a bicycle.  Operate heavy machinery.  Cook.  Use power tools.  Climb ladders.  Work at [employer].  Take a bath.  Do not drink alcohol.  Do not make any important decisions or sign legal documents.  Stay with an adult.  The first meal following your procedure should be light and small. Avoid solid foods if you feel sick to your stomach (nauseous) or if you throw up (vomit).  Drink enough fluids to keep your urine clear or pale yellow.  Only take your usual medicines or new medicines if your caregiver approves them.  Only take over-the-counter or prescription medicines for pain, discomfort, or fever as directed by your caregiver.  Keep all follow-up appointments as directed by your caregiver. SEEK IMMEDIATE MEDICAL CARE IF:   You are not feeling normal or behaving normally after 24 hours.  You have persistent nausea and vomiting.  You are unable to drink fluids or eat food.  You have difficulty urinating.  You have difficulty breathing or speaking.  You have blue or gray skin.  There is difficulty waking or you cannot be woken up.  You have heavy bleeding, redness, or a lot of swelling where the sedative or anesthesia entered your skin (intravenous site).  You have a rash. MAKE SURE YOU:  Understand these instructions.  Will watch your condition.  Will get help right away if you are not doing well  or get worse. Document Released: 01/29/2005 Document Revised: 07/31/2011 Document Reviewed: 05/30/2011 [facility] Patient Information 2014 [place], [place].  Atrial Fibrillation Atrial fibrillation is an abnormal heartbeat (rhythm). It can cause your heart rate to be faster or slower than normal, and can cause clots of blood to form in your heart. These clots can cause other health problems. Atrial fibrillation may be caused by a heart attack, lung problem, or certain medicine. Sometimes the cause of atrial fibrillation is not found. HOME CARE  Take blood thinning medicine (anticoagulants) as told by your doctor. Your doctor will need to draw your blood to check lab values if you take blood thinners.  If you had a cardioversion, limit your activity as told by your doctor.  Learn how to check your heartbeat (pulse) for an abnormal or irregular beat. Your doctor can show you how.  Ask your doctor if it is okay to exercise.  Only take medicine as told by your doctor. GET HELP RIGHT AWAY IF:   You have trouble breathing or feel dizzy.  You have puffy (swollen) feet or ankles.  You have blood in your pee (urine) or poop (bowel movement).  You feel your heart "skipping" beats.  You feel your heart "racing" or beating fast.  You have weakness in your arms or legs.  You have trouble talking, seeing, or thinking.  You have chest pain or pain in your arm or jaw. MAKE SURE YOU:   Understand these instructions.  Will watch your condition.  Will get help right away if  you are not doing well or get worse. Document Released: 11/08/2007 Document Revised: 04/23/2011 Document Reviewed: 05/19/2009 [facility] Patient Information 2014 [place], [place].

## 2012-07-11 NOTE — Anesthesia Preprocedure Evaluation (Addendum)
Anesthesia Evaluation  Patient identified by MRN, date of birth, ID band Patient awake    Reviewed: Allergy & Precautions, H&P , NPO status , Patient's Chart, lab work & pertinent test results, reviewed documented beta blocker date and time   Airway Mallampati: I TM Distance: >3 FB Neck ROM: Full    Dental  (+) Teeth Intact   Pulmonary sleep apnea ,  breath sounds clear to auscultation        Cardiovascular hypertension, Pt. on home beta blockers + dysrhythmias Atrial Fibrillation Rhythm:Irregular     Neuro/Psych  Headaches,    GI/Hepatic   Endo/Other  diabetes, Well Controlled, Type 2, Oral Hypoglycemic AgentsHypothyroidism   Renal/GU      Musculoskeletal   Abdominal   Peds  Hematology   Anesthesia Other Findings   Reproductive/Obstetrics                         Anesthesia Physical Anesthesia Plan  ASA: III  Anesthesia Plan: General and MAC   Post-op Pain Management:    Induction: Intravenous  Airway Management Planned: Mask  Additional Equipment:   Intra-op Plan:   Post-operative Plan:   Informed Consent: I have reviewed the patients History and Physical, chart, labs and discussed the procedure including the risks, benefits and alternatives for the proposed anesthesia with the patient or authorized representative who has indicated his/her understanding and acceptance.   Dental advisory given  Plan Discussed with: CRNA and Anesthesiologist  Anesthesia Plan Comments:         Anesthesia Quick Evaluation

## 2012-07-11 NOTE — Transfer of Care (Signed)
Immediate Anesthesia Transfer of Care Note  Patient: Jessica Mendoza  Procedure(s) Performed: Procedure(s): CARDIOVERSION (N/A)  Patient Location: Endoscopy Unit  Anesthesia Type:General  Level of Consciousness: awake, alert  and oriented  Airway & Oxygen Therapy: Patient Spontanous Breathing and Patient connected to nasal cannula oxygen  Post-op Assessment: Report given to PACU RN and Post -op Vital signs reviewed and stable  Post vital signs: Reviewed and stable  Complications: No apparent anesthesia complications

## 2012-07-15 ENCOUNTER — Ambulatory Visit (INDEPENDENT_AMBULATORY_CARE_PROVIDER_SITE_OTHER): Payer: 59 | Admitting: Emergency Medicine

## 2012-07-15 VITALS — BP 110/69 | HR 75 | Temp 98.7°F | Resp 16 | Ht 66.0 in | Wt 208.0 lb

## 2012-07-15 DIAGNOSIS — I10 Essential (primary) hypertension: Secondary | ICD-10-CM

## 2012-07-15 DIAGNOSIS — E119 Type 2 diabetes mellitus without complications: Secondary | ICD-10-CM

## 2012-07-15 DIAGNOSIS — E785 Hyperlipidemia, unspecified: Secondary | ICD-10-CM

## 2012-07-15 DIAGNOSIS — I4891 Unspecified atrial fibrillation: Secondary | ICD-10-CM

## 2012-07-15 LAB — POCT GLYCOSYLATED HEMOGLOBIN (HGB A1C): Hemoglobin A1C: 6.3

## 2012-07-15 NOTE — Progress Notes (Signed)
  Subjective:    Patient ID: Jessica Mendoza, female    DOB: 1955-09-24, 57 y.o.   MRN: [id number]  HPI since her last visit here patient has had a cardioversion for atrial fibrillation. She feels well now. She was having some shortness of breath and near syncopal episodes and now feels well. She continues on her medications. She has a followup appointment with the cardiologist. Her last hemoglobin A1c was 6   Review of Systems     Objective:   Physical Exam HEENT is negative chest clear. heart regular rate today. Extremity exam reveals normal sensation pulses 2+        Assessment & Plan:  Patient looks great her last A1c was 6 we'll check A1c today. She is scheduled for sleep study.She is in sinus rhythm today.

## 2012-07-25 ENCOUNTER — Encounter: Payer: 59 | Admitting: Internal Medicine

## 2012-07-28 ENCOUNTER — Ambulatory Visit (HOSPITAL_BASED_OUTPATIENT_CLINIC_OR_DEPARTMENT_OTHER): Payer: 59 | Attending: Internal Medicine

## 2012-07-28 VITALS — Ht 65.0 in | Wt 213.0 lb

## 2012-07-28 DIAGNOSIS — G4733 Obstructive sleep apnea (adult) (pediatric): Secondary | ICD-10-CM

## 2012-08-03 DIAGNOSIS — G4733 Obstructive sleep apnea (adult) (pediatric): Secondary | ICD-10-CM

## 2012-08-03 NOTE — Procedures (Signed)
NAME:  Jessica Mendoza, Jessica Mendoza             ACCOUNT NO.:  [id number]  MEDICAL RECORD NO.:  [id number]          PATIENT TYPE:  OUT  LOCATION:  SLEEP CENTER                 FACILITY:  [facility]  PHYSICIAN:  Barbaraann Share, MD,FCCPDATE OF BIRTH:  Nov 14, 1954  DATE OF STUDY:  07/28/2012                           NOCTURNAL POLYSOMNOGRAM  REFERRING PHYSICIAN:  Duke Salvia, MD, [facility]  INDICATION FOR STUDY:  Hypersomnia with sleep apnea.  EPWORTH SLEEPINESS SCORE:  18.  SLEEP ARCHITECTURE:  The patient had a total sleep time of 334 minutes with no slow-wave sleep and decreased quantity of REM.  Sleep onset latency was normal at 6 minutes, and REM onset was normal at 74 minutes. Sleep efficiency was adequate at 91%.  RESPIRATORY DATA:  The patient was found to have 18 apneas and 78 obstructive hypopneas, giving her an apnea-hypopnea index of 17 events per hour.  The vas majority of her events occurred during REM, giving her a REM AHI of 62 events per hour.  The events occurred in all body positions, and there was moderate snoring noted throughout.  OXYGEN DATA:  There was O2 desaturation as low as 80% with the patient's obstructive events.  CARDIAC DATA:  Occasional PVC noted, but no clinically significant arrhythmias were seen.  MOVEMENTS/PARASOMNIA:  The patient had no significant leg jerks or other abnormal behaviors noted.  IMPRESSION/RECOMMENDATIONS: 1. Mild-to-moderate obstructive sleep apnea, with an AHI of 17 events     per hour and oxygen desaturation as low as 80%.  The majority of     the patient's events occurred during REM, giving her a REM AHI of     62 events per hour.  Treatment for this degree of sleep apnea can     include a trial of weight loss alone, upper airway surgery, dental     appliance, and also CPAP.  Clinical correlation is suggested. 2. Occasional PVC noted, but no clinically significant arrhythmias     were seen.     Barbaraann Share, MD,FCCP Diplomate,  American Board of Sleep Medicine   [facility]/MED[facility]  D:  08/03/2012 15:52:13  T:  08/03/2012 17:58:14  Job:  [id number]

## 2012-08-07 ENCOUNTER — Ambulatory Visit (HOSPITAL_COMMUNITY): Payer: 59 | Attending: Cardiology | Admitting: Radiology

## 2012-08-07 VITALS — BP 110/71 | HR 69 | Ht 65.5 in | Wt 211.0 lb

## 2012-08-07 DIAGNOSIS — R002 Palpitations: Secondary | ICD-10-CM | POA: Insufficient documentation

## 2012-08-07 DIAGNOSIS — R0989 Other specified symptoms and signs involving the circulatory and respiratory systems: Secondary | ICD-10-CM | POA: Insufficient documentation

## 2012-08-07 DIAGNOSIS — R0609 Other forms of dyspnea: Secondary | ICD-10-CM | POA: Insufficient documentation

## 2012-08-07 DIAGNOSIS — I4949 Other premature depolarization: Secondary | ICD-10-CM

## 2012-08-07 DIAGNOSIS — R5381 Other malaise: Secondary | ICD-10-CM | POA: Insufficient documentation

## 2012-08-07 DIAGNOSIS — R0602 Shortness of breath: Secondary | ICD-10-CM

## 2012-08-07 DIAGNOSIS — R079 Chest pain, unspecified: Secondary | ICD-10-CM | POA: Insufficient documentation

## 2012-08-07 DIAGNOSIS — I4891 Unspecified atrial fibrillation: Secondary | ICD-10-CM | POA: Insufficient documentation

## 2012-08-07 MED ORDER — TECHNETIUM TC 99M SESTAMIBI GENERIC - CARDIOLITE
11.0000 | Freq: Once | INTRAVENOUS | Status: AC | PRN
  Administered 2012-08-07: 11 via INTRAVENOUS

## 2012-08-07 MED ORDER — TECHNETIUM TC 99M SESTAMIBI GENERIC - CARDIOLITE
33.0000 | Freq: Once | INTRAVENOUS | Status: AC | PRN
  Administered 2012-08-07: 33 via INTRAVENOUS

## 2012-08-07 NOTE — Progress Notes (Signed)
[facility] SITE 3 NUCLEAR MED [address] [place], [place] [address] [phone]    Cardiology Nuclear Med Study  Jessica Mendoza is a 57 y.o. female     MRN : [id number]     DOB: March 19, 1955  Procedure Date: 08/07/2012  Nuclear Med Background Indication for Stress Test:  Evaluation for Ischemia and Post Cardioversion for New Atrial Fibrillation History:  06/23/12 Echo:EF=60%; 07/11/12 Cardioversion for new a-fib Cardiac Risk Factors: Family History - CAD, History of Smoking, Hypertension, Lipids, NIDDM and Obesity  Symptoms:  Chest Pain with and without Exertion (last episode of chest discomfort was prior to cardioversion), Diaphoresis, DOE/SOB, Fatigue, Palpitations and Rapid HR   Nuclear Pre-Procedure Caffeine/Decaff Intake:  None > 12 hrs NPO After: 8:00pm   Lungs:  Clear. O2 Sat: 97% on room air. IV 0.9% NS with Angio Cath:  20g  IV Site: R Antecubital x 1, tolerated well IV Started by:  Irean Hong, RN  Chest Size (in):  36 Cup Size: C  Height: 5' 5.5" (1.664 m)  Weight:  211 lb (95.709 kg)  BMI:  Body mass index is 34.57 kg/(m^2). Tech Comments:  Held Toprol x 36 hrs; held metformin this am    Nuclear Med Study 1 or 2 day study: 1 day  Stress Test Type:  Stress  Reading MD: Olga Millers, MD  Order Authorizing Provider:  Sherryl Manges, MD  Resting Radionuclide: Technetium 34m Sestamibi  Resting Radionuclide Dose: 11.0 mCi   Stress Radionuclide:  Technetium 59m Sestamibi  Stress Radionuclide Dose: 33.0 mCi           Stress Protocol Rest HR: 69 Stress HR: 140  Rest BP: 110/71 Stress BP: 216/87  Exercise Time (min): 9:00 METS: 10.1   Predicted Max HR: 163 bpm % Max HR: 85.89 bpm Rate Pressure Product: 56433   Dose of Adenosine (mg):  n/a Dose of Lexiscan: n/a mg  Dose of Atropine (mg): n/a Dose of Dobutamine: n/a mcg/kg/min (at max HR)  Stress Test Technologist: Smiley Houseman, CMA-N  Nuclear Technologist:  Domenic Polite, CNMT     Rest  Procedure:  Myocardial perfusion imaging was performed at rest 45 minutes following the intravenous administration of Technetium 56m Sestamibi.  Rest ECG: NSR - Normal EKG  Stress Procedure:  The patient exercised on the treadmill utilizing the Bruce Protocol for nine minutes. The patient stopped due to fatigue and denied any chest pain.  Technetium 76m Sestamibi was injected at peak exercise and myocardial perfusion imaging was performed after a brief delay.  Stress ECG: No significant ST segment change suggestive of ischemia.  QPS Raw Data Images:  There is interference from nuclear activity from structures below the diaphragm. This does not affect the ability to read the study. Stress Images:  Normal homogeneous uptake in all areas of the myocardium. Rest Images:  Normal homogeneous uptake in all areas of the myocardium. Subtraction (SDS):  No evidence of ischemia. Transient Ischemic Dilatation (Normal <1.22):  1.11 Lung/Heart Ratio (Normal <0.45):  0.32  Quantitative Gated Spect Images QGS EDV:  83 ml QGS ESV:  26 ml  Impression Exercise Capacity:  Good exercise capacity. BP Response:  Hypertensive blood pressure response. Clinical Symptoms:  There is dyspnea. ECG Impression:  No significant ST segment change suggestive of ischemia. Comparison with Prior Nuclear Study: No previous nuclear study performed  Overall Impression:  Normal stress nuclear study.  LV Ejection Fraction: 68%.  LV Wall Motion:  NL LV Function; NL Wall Motion  Kirk Ruths

## 2012-08-12 ENCOUNTER — Ambulatory Visit (INDEPENDENT_AMBULATORY_CARE_PROVIDER_SITE_OTHER): Payer: 59 | Admitting: Internal Medicine

## 2012-08-12 ENCOUNTER — Encounter: Payer: Self-pay | Admitting: Internal Medicine

## 2012-08-12 VITALS — BP 129/71 | HR 69 | Ht 65.0 in | Wt 214.2 lb

## 2012-08-12 DIAGNOSIS — I4891 Unspecified atrial fibrillation: Secondary | ICD-10-CM

## 2012-08-12 DIAGNOSIS — G4733 Obstructive sleep apnea (adult) (pediatric): Secondary | ICD-10-CM

## 2012-08-12 DIAGNOSIS — I10 Essential (primary) hypertension: Secondary | ICD-10-CM

## 2012-08-12 NOTE — Assessment & Plan Note (Signed)
Currently well controlled. On ARB for diabetes renal protection

## 2012-08-12 NOTE — Assessment & Plan Note (Signed)
Holding sinus rhythm. Continue anticoagulation. We discussed the likelihood that atrial fibrillation will recur.

## 2012-08-12 NOTE — Assessment & Plan Note (Signed)
Sleep study was abnormal. I will be in touch with a sleep physicians

## 2012-08-12 NOTE — Patient Instructions (Signed)
Your physician wants you to follow-up in: 1 year with Dr. Klein. You will receive a reminder letter in the mail two months in advance. If you don't receive a letter, please call our office to schedule the follow-up appointment.  Your physician recommends that you continue on your current medications as directed. Please refer to the Current Medication list given to you today.  

## 2012-08-12 NOTE — Progress Notes (Signed)
Patient Care Team: Collene Gobble, MD as PCP - General (Family Medicine) Dorisann Frames, MD (Endocrinology) Louis Meckel, MD (Gastroenterology) Annamaria Helling, MD (Obstetrics and Gynecology)   HPI  Jessica Mendoza is a 57 y.o. female Seen in followup for atrial fibrillation associated with exercise intolerance. She had a CHADS2 score of 2 and a CHADS-VASc score of 3 and Rivaroxaban she underwent cardioversion 5/30  Interval Myoview was normal; AHI was 17 on sleep study  She is better sinus rhythm with improved exercise tolerance. Sand was in theHe is tolerating her medications. Her hemoglobin A1c    Past Medical History  Diagnosis Date  . Diabetes mellitus, type 2 06/2010  . Essential hypertension, benign   . Other and unspecified hyperlipidemia   . Migraines     menstrual  . Colon polyps 12/2005  . Focal nodular hyperplasia of liver     bx 2002  . Benign positional vertigo   . Goiter 10/2006  . Hypothyroidism   . Arthritis     WRISTS AND NECK  . Glaucoma     Past Surgical History  Procedure Laterality Date  . Liver biopsy  2002  . Breast lumpectomy      right breast- benign  . Cardioversion N/A 07/11/2012    Procedure: CARDIOVERSION;  Surgeon: Laurey Morale, MD;  Location: [facility] ENDOSCOPY;  Service: Cardiovascular;  Laterality: N/A;    Current Outpatient Prescriptions  Medication Sig Dispense Refill  . amLODipine (NORVASC) 10 MG tablet Take 1 tablet (10 mg total) by mouth daily. NEED REFILLS  30 tablet  11  . Coenzyme Q10 (CO Q 10) 100 MG CAPS Take by mouth.      . Fish Oil-Cholecalciferol (FISH OIL + D3 PO) Take by mouth.      . levothyroxine (SYNTHROID, LEVOTHROID) 25 MCG tablet Take 25 mcg by mouth daily. NEED REFILLS      . losartan-hydrochlorothiazide (HYZAAR) 100-12.5 MG per tablet Take 1 tablet by mouth daily. NEED REFILLS  30 tablet  11  . metFORMIN (GLUCOPHAGE-XR) 500 MG 24 hr tablet Take 1 tablet (500 mg total) by mouth daily with breakfast. Needs office  visit/labs  60 tablet  2  . metoprolol succinate (TOPROL-XL) 100 MG 24 hr tablet Take 1 tablet (100 mg total) by mouth daily. NEED REFILLS  30 tablet  11  . Multiple Vitamins-Minerals (WOMENS 50+ MULTI VITAMIN/MIN) TABS Take by mouth daily.      . potassium chloride (K-DUR,KLOR-CON) 10 MEQ tablet Take two tablets by mouth twice daily on 5/29 & 5/30, then take one tablet by mouth daily  30 tablet  6  . Rivaroxaban (XARELTO) 20 MG TABS Take 1 tablet (20 mg total) by mouth daily.  30 tablet  2  . rosuvastatin (CRESTOR) 10 MG tablet Take 1 tablet (10 mg total) by mouth daily.  30 tablet  11  . vitamin C (ASCORBIC ACID) 500 MG tablet Take 500 mg by mouth daily.       No current facility-administered medications for this visit.    Allergies  Allergen Reactions  . Simvastatin     Myalgias  ?? lipitor     Review of Systems negative except from HPI and PMH  Physical Exam BP 129/71  Pulse 69  Ht 5\' 5"  (1.651 m)  Wt 214 lb 3.2 oz (97.16 kg)  BMI 35.64 kg/m2  Well developed and well nourished in no acute distress HENT normal E scleral and icterus clear Neck Supple JVP flat; carotids brisk and  full Clear to ausculation regular rate and rhythm, no murmurs gallops or rub Soft with active bowel sounds No clubbing cyanosis none Edema Alert and oriented, grossly normal motor and sensory function Skin Warm and Dry  ECG: Sinus Rhythm  @69             Intervals  18/07/41  Axis 67      Assessment and  Plan

## 2012-09-02 ENCOUNTER — Other Ambulatory Visit: Payer: Self-pay | Admitting: Family Medicine

## 2012-09-04 ENCOUNTER — Other Ambulatory Visit: Payer: Self-pay | Admitting: *Deleted

## 2012-09-04 DIAGNOSIS — I4891 Unspecified atrial fibrillation: Secondary | ICD-10-CM

## 2012-09-04 MED ORDER — RIVAROXABAN 20 MG PO TABS: 20.0000 mg | ORAL_TABLET | Freq: Every day | ORAL | Status: DC

## 2012-11-25 ENCOUNTER — Encounter: Payer: Self-pay | Admitting: Emergency Medicine

## 2012-11-25 ENCOUNTER — Ambulatory Visit (INDEPENDENT_AMBULATORY_CARE_PROVIDER_SITE_OTHER): Payer: 59 | Admitting: Emergency Medicine

## 2012-11-25 VITALS — BP 130/78 | HR 78 | Temp 97.9°F | Resp 20 | Ht 66.0 in | Wt 218.0 lb

## 2012-11-25 DIAGNOSIS — E7800 Pure hypercholesterolemia, unspecified: Secondary | ICD-10-CM

## 2012-11-25 DIAGNOSIS — I10 Essential (primary) hypertension: Secondary | ICD-10-CM

## 2012-11-25 DIAGNOSIS — Z79899 Other long term (current) drug therapy: Secondary | ICD-10-CM

## 2012-11-25 DIAGNOSIS — E119 Type 2 diabetes mellitus without complications: Secondary | ICD-10-CM

## 2012-11-25 DIAGNOSIS — E049 Nontoxic goiter, unspecified: Secondary | ICD-10-CM

## 2012-11-25 DIAGNOSIS — Z23 Encounter for immunization: Secondary | ICD-10-CM

## 2012-11-25 LAB — COMPREHENSIVE METABOLIC PANEL
ALT: 23 U/L (ref 0–35)
AST: 20 U/L (ref 0–37)
Albumin: 4.2 g/dL (ref 3.5–5.2)
Alkaline Phosphatase: 69 U/L (ref 39–117)
BUN: 11 mg/dL (ref 6–23)
CO2: 30 mEq/L (ref 19–32)
Calcium: 9.1 mg/dL (ref 8.4–10.5)
Chloride: 99 mEq/L (ref 96–112)
Creat: 0.83 mg/dL (ref 0.50–1.10)
Glucose, Bld: 114 mg/dL — ABNORMAL HIGH (ref 70–99)
Potassium: 3.8 meq/L (ref 3.5–5.3)
Sodium: 136 meq/L (ref 135–145)
Total Bilirubin: 0.7 mg/dL (ref 0.3–1.2)
Total Protein: 7.2 g/dL (ref 6.0–8.3)

## 2012-11-25 LAB — LIPID PANEL
Cholesterol: 175 mg/dL (ref 0–200)
HDL: 59 mg/dL (ref >39)
LDL Cholesterol: 87 mg/dL (ref 0–99)
Total CHOL/HDL Ratio: 3 Ratio
Triglycerides: 146 mg/dL (ref <150)
VLDL: 29 mg/dL (ref 0–40)

## 2012-11-25 LAB — POCT GLYCOSYLATED HEMOGLOBIN (HGB A1C): Hemoglobin A1C: 5.9

## 2012-11-25 LAB — GLUCOSE, POCT (MANUAL RESULT ENTRY): POC Glucose: 108 mg/dL — AB (ref 70–99)

## 2012-11-25 NOTE — Progress Notes (Signed)
  Subjective:    Patient ID: Jessica Mendoza, female    DOB: 1955/12/25, 57 y.o.   MRN: [id number]  HPI patient here for followup of diabetes type 2. She has hyperlipidemia hypertension as well as diabetes. She has a known goiter for which she sees Dr.Balan. She overall feels well. She has no complaints. She has known atrial fib currently on Xarelto for this    Review of Systems     Objective:   Physical Exam patient is alert and cooperative. Neck exam reveals a significant goiter with nodularity. Chest was clear. Heart was slightly irregular no murmur extremity exam pulses 2+ no edema to the testing   Results for orders placed in visit on 11/25/12  GLUCOSE, POCT (MANUAL RESULT ENTRY)      Result Value Range   POC Glucose 108 (*) 70 - 99 mg/dl  POCT GLYCOSYLATED HEMOGLOBIN (HGB A1C)      Result Value Range   Hemoglobin A1C 5.9          Assessment & Plan:    Diabetes under good control. Labs done

## 2012-12-07 ENCOUNTER — Other Ambulatory Visit: Payer: Self-pay | Admitting: Physician Assistant

## 2012-12-10 ENCOUNTER — Encounter: Payer: Self-pay | Admitting: Emergency Medicine

## 2012-12-11 ENCOUNTER — Other Ambulatory Visit: Payer: Self-pay | Admitting: Physician Assistant

## 2013-01-02 ENCOUNTER — Other Ambulatory Visit: Payer: Self-pay | Admitting: Emergency Medicine

## 2013-03-03 ENCOUNTER — Ambulatory Visit: Payer: 59 | Admitting: Emergency Medicine

## 2013-03-10 ENCOUNTER — Telehealth: Payer: Self-pay | Admitting: Internal Medicine

## 2013-03-10 NOTE — Telephone Encounter (Signed)
New message   C/O xarelto 20 mg  at night before bed. Next day blood in mouth.

## 2013-03-11 ENCOUNTER — Other Ambulatory Visit: Payer: Self-pay | Admitting: *Deleted

## 2013-03-11 NOTE — Telephone Encounter (Signed)
Pt says that for the past couple of weeks she has been taking Xarelto every other day, and on the days she takes her med she has note bleeding in her mouth the next morning (no blood on the days she didn't take med). She has some bruising on shoulders and back, but none on lower extremities. She also states that on the days she takes her med, she has more bleeding when she brushes her teeth. Will discuss with MD and get back with her on recommendations. Pt agreeable to plan .

## 2013-03-11 NOTE — Telephone Encounter (Signed)
Advised pt to stop Xarelto, per Dr. Caryl Comes.  Follow up visit scheduled for 2/18 at 1:45pm. Pt agreeable to plan.

## 2013-03-13 IMAGING — CR DG CHEST 2V
2 series · 2 of 2 positions shown · non-contrast
Comparison: None

CLINICAL DATA: Preop for breast surgery.

CHEST - 2 VIEW

[w chest pa]
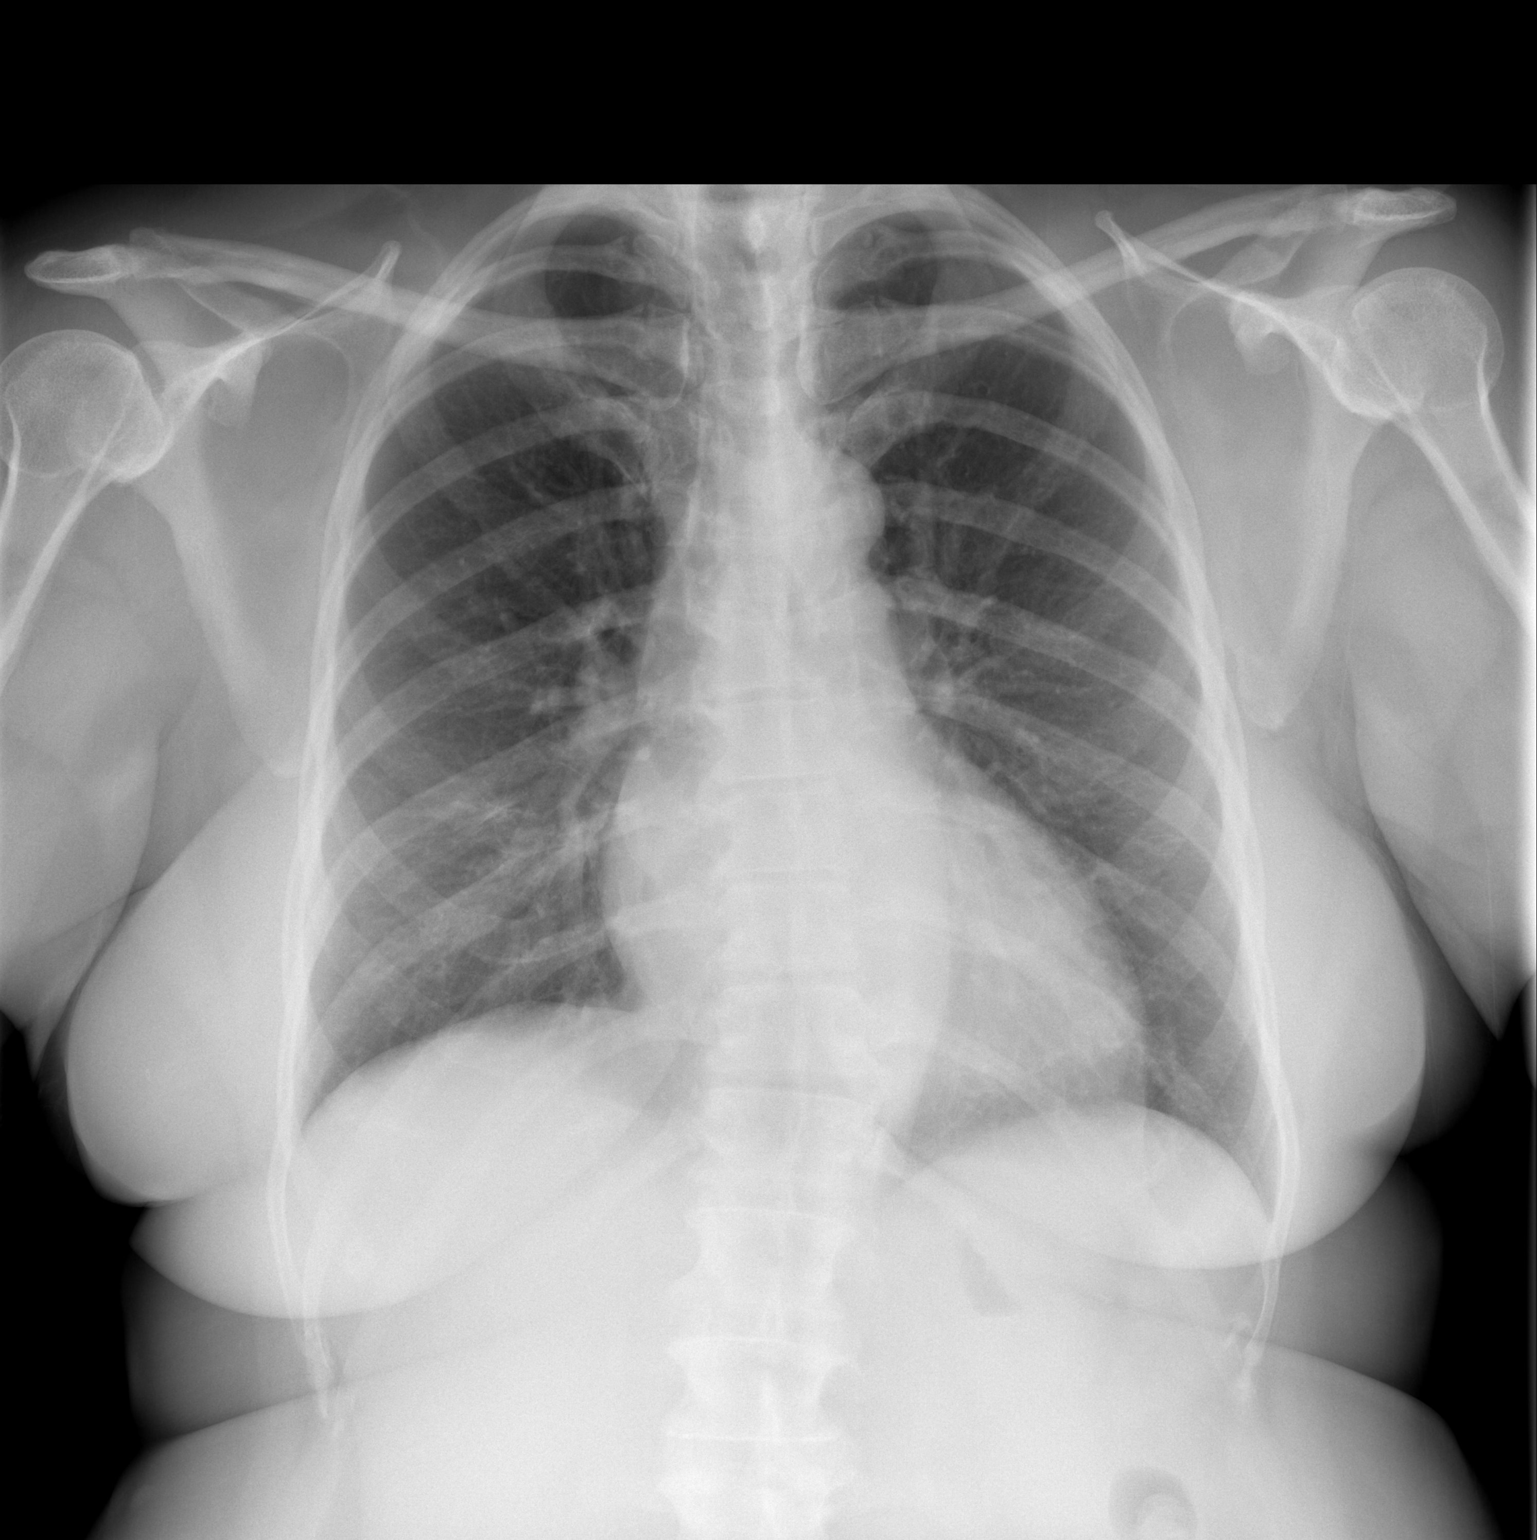

[w chest lat]
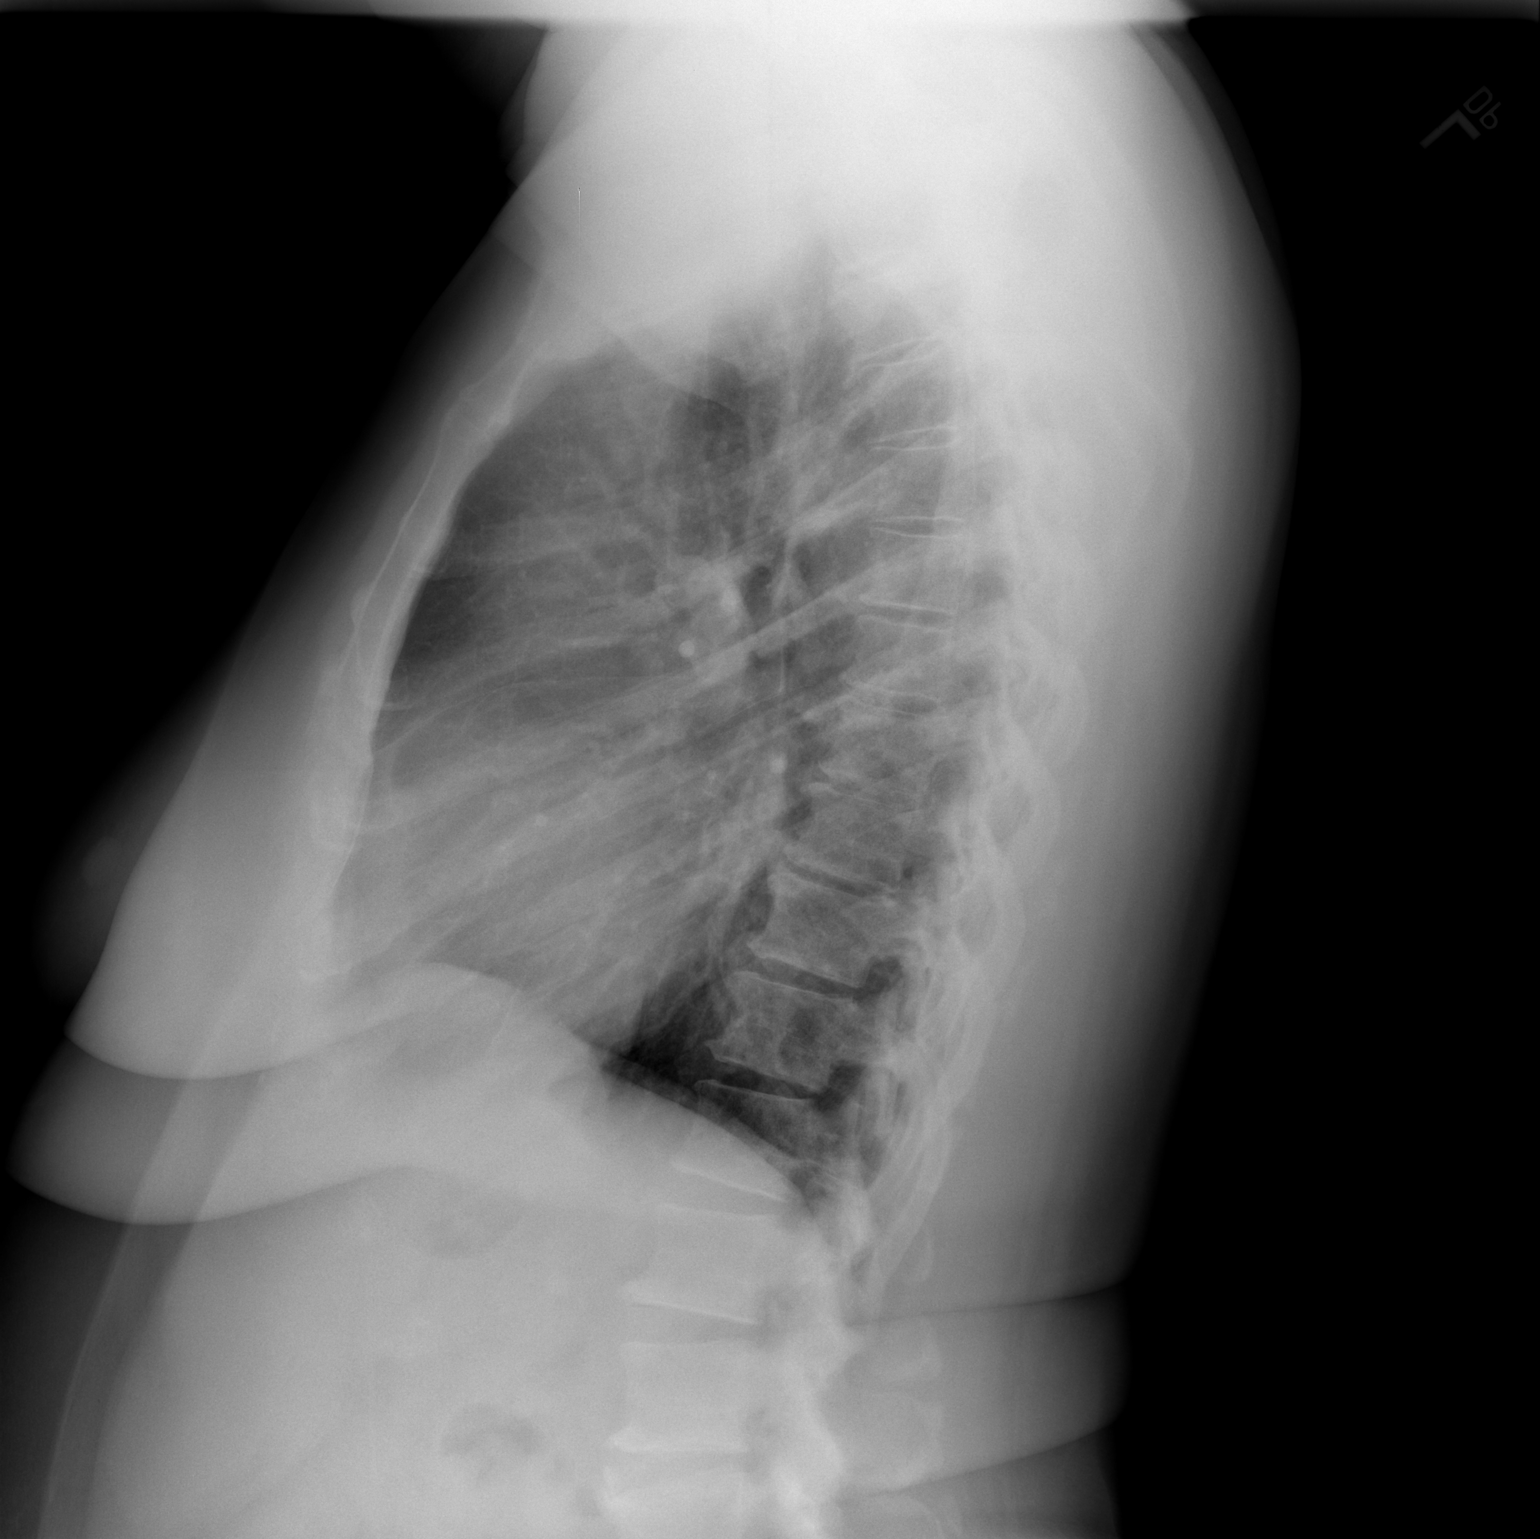

[2 of 2 positions shown; findings below may reference images not displayed]

FINDINGS: The cardiac silhouette, mediastinal and hilar contours
are within normal limits.  The lungs are clear.  No pleural
effusion.  No pulmonary masses.  The bony thorax is intact.
IMPRESSION: No acute cardiopulmonary findings.

## 2013-03-14 ENCOUNTER — Other Ambulatory Visit: Payer: Self-pay | Admitting: Emergency Medicine

## 2013-03-22 ENCOUNTER — Ambulatory Visit (INDEPENDENT_AMBULATORY_CARE_PROVIDER_SITE_OTHER): Payer: 59 | Admitting: Internal Medicine

## 2013-03-22 ENCOUNTER — Ambulatory Visit: Payer: 59

## 2013-03-22 VITALS — BP 123/83 | HR 75 | Temp 98.6°F | Resp 18 | Wt 211.0 lb

## 2013-03-22 DIAGNOSIS — J9801 Acute bronchospasm: Secondary | ICD-10-CM

## 2013-03-22 DIAGNOSIS — R05 Cough: Secondary | ICD-10-CM

## 2013-03-22 DIAGNOSIS — R059 Cough, unspecified: Secondary | ICD-10-CM

## 2013-03-22 MED ORDER — FLUTICASONE FUROATE 100 MCG/ACT IN AEPB: 100.0000 ug | INHALATION_SPRAY | Freq: Two times a day (BID) | RESPIRATORY_TRACT | Status: DC

## 2013-03-22 MED ORDER — PREDNISONE 20 MG PO TABS: ORAL_TABLET | ORAL | Status: DC

## 2013-03-22 MED ORDER — ALBUTEROL SULFATE (2.5 MG/3ML) 0.083% IN NEBU
2.5000 mg | INHALATION_SOLUTION | Freq: Once | RESPIRATORY_TRACT | Status: AC
  Administered 2013-03-22: 2.5 mg via RESPIRATORY_TRACT

## 2013-03-22 MED ORDER — ALBUTEROL SULFATE HFA 108 (90 BASE) MCG/ACT IN AERS: 2.0000 | INHALATION_SPRAY | Freq: Four times a day (QID) | RESPIRATORY_TRACT | Status: DC | PRN

## 2013-03-22 NOTE — Progress Notes (Signed)
Subjective:   This chart was scribed for Jessica Sia, MD by Jessica Mendoza, [facility] Scribe. This patient was seen in room 9 and the patient's care was started 10:28 AM.    Patient ID: Jessica Mendoza, female    DOB: 1955-11-13, 58 y.o.   MRN: [id number]  HPI HPI Comments: Jessica Mendoza is a 58 y.o. female who presents to [facility] complaining of an ongoing, moderate cough consisting of mild mucous onset 5 weeks. She also reports mild rhinorrhea, wheezing, SOB, and chest tenderness. She denies any alleviating or aggravating factors. At this time she denies any sore throat or fever.  Review of Systems  Constitutional: Negative for fever.  HENT: Positive for rhinorrhea. Negative for sore throat.   Respiratory: Positive for cough and wheezing. Negative for shortness of breath.     Past Medical History  Diagnosis Date  . Diabetes mellitus, type 2 06/2010  . Essential hypertension, benign   . Other and unspecified hyperlipidemia   . Migraines     menstrual  . Colon polyps 12/2005  . Focal nodular hyperplasia of liver     bx 2002  . Benign positional vertigo   . Goiter 10/2006  . Hypothyroidism   . Arthritis     WRISTS AND NECK  . Glaucoma     Past Surgical History  Procedure Laterality Date  . Liver biopsy  2002  . Breast lumpectomy      right breast- benign  . Cardioversion N/A 07/11/2012    Procedure: CARDIOVERSION;  Surgeon: Jessica Morale, MD;  Location: [facility] ENDOSCOPY;  Service: Cardiovascular;  Laterality: N/A;    Objective:   Physical Exam  Nursing note and vitals reviewed. Constitutional: She is oriented to person, place, and time. She appears well-developed and well-nourished.  HENT:  Head: Normocephalic and atraumatic.  Nose: Nose normal.  Mouth/Throat: Oropharynx is clear and moist.  Eyes: Conjunctivae and EOM are normal. Pupils are equal, round, and reactive to light.  Neck: Normal range of motion. No  thyromegaly present.  Cardiovascular: Normal rate, regular rhythm and normal heart sounds.   Pulmonary/Chest: Effort normal. She has wheezes.  Wheezing at both bases on expiration Wheezing with forced expiration anteriorly Rhonchi both bases  Musculoskeletal: Normal range of motion.  Lymphadenopathy:    She has no cervical adenopathy.  Neurological: She is alert and oriented to person, place, and time.  Skin: Skin is warm and dry.  Psychiatric: She has a normal mood and affect. Her behavior is normal.    [facility] reading (PRIMARY) by  Jessica Mendoza=NAD  Nebulization with albuterol resolved wheezing      Assessment & Plan:  I have completed the patient encounter in its entirety as documented by the scribe, with editing by me where necessary. Jessica Mendoza, M.D. Cough - Plan: DG Chest 2 View, albuterol (PROVENTIL) (2.5 MG/3ML) 0.083% nebulizer solution 2.5 mg  Acute bronchospasm - Plan: DG Chest 2 View, albuterol (PROVENTIL) (2.5 MG/3ML) 0.083% nebulizer solution 2.5 mg  Visit is post infectious inflammatory reactive airway disease  Meds ordered this encounter  Medications  . albuterol (PROVENTIL) (2.5 MG/3ML) 0.083% nebulizer solution 2.5 mg    Sig: As noted she responded to this well although she has never used inhalers before and never had asthma in the past   . Fluticasone Furoate (ARNUITY ELLIPTA) 100 MCG/ACT AEPB----coupon for free inhaler given     Sig: Inhale 100 mcg into the lungs 2 (two) times  daily.    Dispense:  1 each    Refill:  1  . albuterol (PROVENTIL HFA;VENTOLIN HFA) 108 (90 BASE) MCG/ACT inhaler    Sig: Inhale 2 puffs into the lungs every 6 (six) hours as needed for wheezing or shortness of breath.    Dispense:  1 Inhaler-----use of inhaler demonstrated and she has learned this adequately     Refill:  0  . predniSONE (DELTASONE) 20 MG tablet----shorter course due to diabetes     Sig: 3/2/1 single daily dose for 3 days    Dispense:  6 tablet    Refill:   0  Has followup with Jessica Mendoza in march

## 2013-04-01 ENCOUNTER — Ambulatory Visit (INDEPENDENT_AMBULATORY_CARE_PROVIDER_SITE_OTHER): Payer: 59 | Admitting: Internal Medicine

## 2013-04-01 ENCOUNTER — Encounter: Payer: Self-pay | Admitting: Internal Medicine

## 2013-04-01 VITALS — BP 121/80 | HR 72 | Ht 66.0 in | Wt 210.0 lb

## 2013-04-01 DIAGNOSIS — I4891 Unspecified atrial fibrillation: Secondary | ICD-10-CM

## 2013-04-01 NOTE — Progress Notes (Signed)
skf      Patient Care Team: Darlyne Russian, MD as PCP - General (Family Medicine) Jacelyn Pi, MD (Endocrinology) Inda Castle, MD (Gastroenterology) Vania Rea, MD (Obstetrics and Gynecology)   HPI  Jessica Mendoza is a 58 y.o. female Seen in followup for atrial fibrillation associated with exercise intolerance. She had a CHADS2 score of 2 and a CHADS-VASc score of 3 and Rivaroxaban  she underwent cardioversion 5/14  She is intercurrently had problems with bleeding from her tongue and has stopped her  Rivaroxaban.    She has had no symptomatic atrial fibrillation  Interval Myoview was normal; AHI was 17 on sleep study  She is working on losing weight as her strategies for dealing with her sleep apnea   She is better in sinus rhythm with improved exercise tolerance.    Past Medical History  Diagnosis Date  . Diabetes mellitus, type 2 06/2010  . Essential hypertension, benign   . Other and unspecified hyperlipidemia   . Migraines     menstrual  . Colon polyps 12/2005  . Focal nodular hyperplasia of liver     bx 2002  . Benign positional vertigo   . Goiter 10/2006  . Hypothyroidism   . Arthritis     WRISTS AND NECK  . Glaucoma     Past Surgical History  Procedure Laterality Date  . Liver biopsy  2002  . Breast lumpectomy      right breast- benign  . Cardioversion N/A 07/11/2012    Procedure: CARDIOVERSION;  Surgeon: Larey Dresser, MD;  Location: [place];  Service: Cardiovascular;  Laterality: N/A;    Current Outpatient Prescriptions  Medication Sig Dispense Refill  . albuterol (PROVENTIL HFA;VENTOLIN HFA) 108 (90 BASE) MCG/ACT inhaler Inhale 2 puffs into the lungs every 6 (six) hours as needed for wheezing or shortness of breath.  1 Inhaler  0  . amLODipine (NORVASC) 10 MG tablet Take 1 tablet (10 mg total) by mouth daily. NEED REFILLS  30 tablet  11  . Coenzyme Q10 (CO Q 10) 100 MG CAPS Take by mouth.      . Fluticasone Furoate (ARNUITY  ELLIPTA) 100 MCG/ACT AEPB Inhale 100 mcg into the lungs 2 (two) times daily.  1 each  1  . levothyroxine (SYNTHROID, LEVOTHROID) 25 MCG tablet Take 25 mcg by mouth daily. NEED REFILLS      . losartan-hydrochlorothiazide (HYZAAR) 100-12.5 MG per tablet TAKE 1 TABLET BY MOUTH DAILY  30 tablet  2  . metFORMIN (GLUCOPHAGE-XR) 500 MG 24 hr tablet TAKE 1 TABLET BY MOUTH DAILY WITH BREAKFAST  60 tablet  2  . metoprolol succinate (TOPROL-XL) 100 MG 24 hr tablet Take 1 tablet (100 mg total) by mouth daily. NEED REFILLS  30 tablet  11  . Multiple Vitamins-Minerals (WOMENS 50+ MULTI VITAMIN/MIN) TABS Take by mouth daily.      . potassium chloride (K-DUR,KLOR-CON) 10 MEQ tablet take once daily.      . rosuvastatin (CRESTOR) 10 MG tablet Take 1 tablet (10 mg total) by mouth daily.  30 tablet  11  . XARELTO 20 MG TABS tablet daily.       No current facility-administered medications for this visit.    Allergies  Allergen Reactions  . Simvastatin     Myalgias  ?? lipitor     Review of Systems negative except from HPI and PMH  Physical Exam BP 121/80  Pulse 76  Ht 5\' 6"  (1.676 m)  Wt 210 lb (95.255  kg)  BMI 33.91 kg/m2 Well developed and well nourished in no acute distress HENT normal E scleral and icterus clear Neck Supple JVP flat; carotids brisk and full Clear to ausculation  *Regular rate and rhythm, no murmurs gallops or rub Soft with active bowel sounds No clubbing cyanosis none Edema Alert and oriented, grossly normal motor and sensory function Skin Warm and Dry  ECG demonstrates sinus rhythm at 72 intervals 19/07/40  R prime in lead V1  Assessment and  Plan  Hypertension well controlled   Atrial fibrillation-paroxysmal no symptomatic recurrences  We had a lengthy discussion regarding anticoagulation and the risks and benefits. I've asked her to seek examination of her tongue to make sure there is no pathology there giving rise to the bleeding. In the event that nothing is seen  we'll undertake a trial of apixaban  Sleep apnea treated with weight loss  Obesity As above  Tongue bleeding As above

## 2013-04-01 NOTE — Patient Instructions (Signed)
Your physician recommends that you continue on your current medications as directed. Please refer to the Current Medication list given to you today.  Your physician wants you to follow-up in: 12 months with Dr. Klein.You will receive a reminder letter in the mail two months in advance. If you don't receive a letter, please call our office to schedule the follow-up appointment.    

## 2013-04-04 ENCOUNTER — Other Ambulatory Visit: Payer: Self-pay | Admitting: Emergency Medicine

## 2013-04-07 ENCOUNTER — Ambulatory Visit: Payer: 59 | Admitting: Emergency Medicine

## 2013-04-07 ENCOUNTER — Ambulatory Visit (INDEPENDENT_AMBULATORY_CARE_PROVIDER_SITE_OTHER): Payer: 59 | Admitting: Emergency Medicine

## 2013-04-07 VITALS — BP 115/76 | HR 71 | Temp 98.5°F | Resp 16 | Ht 65.5 in | Wt 212.0 lb

## 2013-04-07 DIAGNOSIS — E7800 Pure hypercholesterolemia, unspecified: Secondary | ICD-10-CM

## 2013-04-07 DIAGNOSIS — E119 Type 2 diabetes mellitus without complications: Secondary | ICD-10-CM

## 2013-04-07 DIAGNOSIS — I10 Essential (primary) hypertension: Secondary | ICD-10-CM

## 2013-04-07 DIAGNOSIS — I4891 Unspecified atrial fibrillation: Secondary | ICD-10-CM

## 2013-04-07 DIAGNOSIS — D649 Anemia, unspecified: Secondary | ICD-10-CM

## 2013-04-07 LAB — COMPLETE METABOLIC PANEL WITH GFR
ALT: 38 U/L — ABNORMAL HIGH (ref 0–35)
AST: 23 U/L (ref 0–37)
Albumin: 4.3 g/dL (ref 3.5–5.2)
Alkaline Phosphatase: 74 U/L (ref 39–117)
BUN: 9 mg/dL (ref 6–23)
CO2: 29 mEq/L (ref 19–32)
Calcium: 9.2 mg/dL (ref 8.4–10.5)
Chloride: 102 mEq/L (ref 96–112)
Creat: 0.79 mg/dL (ref 0.50–1.10)
GFR, Est African American: >89 mL/min
GFR, Est Non African American: 83 mL/min
Glucose, Bld: 118 mg/dL — ABNORMAL HIGH (ref 70–99)
Potassium: 4 mEq/L (ref 3.5–5.3)
Sodium: 142 mEq/L (ref 135–145)
Total Bilirubin: 0.8 mg/dL (ref 0.2–1.2)
Total Protein: 7.2 g/dL (ref 6.0–8.3)

## 2013-04-07 LAB — POCT CBC
Granulocyte percent: 44.7 %G (ref 37–80)
HCT, POC: 42.1 % (ref 37.7–47.9)
Hemoglobin: 13.1 g/dL (ref 12.2–16.2)
Lymph, poc: 2.4 (ref 0.6–3.4)
MCH, POC: 27.7 pg (ref 27–31.2)
MCHC: 31.1 g/dL — AB (ref 31.8–35.4)
MCV: 89 fL (ref 80–97)
MID (cbc): 0.5 (ref 0–0.9)
MPV: 10.4 fL (ref 0–99.8)
POC Granulocyte: 2.3 (ref 2–6.9)
POC LYMPH PERCENT: 46.4 % (ref 10–50)
POC MID %: 8.9 %M (ref 0–12)
Platelet Count, POC: 214 10*3/uL (ref 142–424)
RBC: 4.73 M/uL (ref 4.04–5.48)
RDW, POC: 15.2 %
WBC: 5.2 10*3/uL (ref 4.6–10.2)

## 2013-04-07 LAB — LIPID PANEL
Cholesterol: 180 mg/dL (ref 0–200)
HDL: 62 mg/dL (ref >39)
LDL Cholesterol: 82 mg/dL (ref 0–99)
Total CHOL/HDL Ratio: 2.9 Ratio
Triglycerides: 179 mg/dL — ABNORMAL HIGH (ref <150)
VLDL: 36 mg/dL (ref 0–40)

## 2013-04-07 LAB — HEMOGLOBIN A1C
Hgb A1c MFr Bld: 6.7 % — ABNORMAL HIGH (ref <5.7)
Mean Plasma Glucose: 146 mg/dL — ABNORMAL HIGH (ref <117)

## 2013-04-07 LAB — GLUCOSE, POCT (MANUAL RESULT ENTRY): POC Glucose: 112 mg/dL — AB (ref 70–99)

## 2013-04-07 LAB — MICROALBUMIN, URINE: Microalb, Ur: 0.5 mg/dL (ref 0.00–1.89)

## 2013-04-07 LAB — TSH: TSH: 0.6 u[IU]/mL (ref 0.350–4.500)

## 2013-04-07 MED ORDER — LOSARTAN POTASSIUM-HCTZ 100-12.5 MG PO TABS: ORAL_TABLET | ORAL | Status: DC

## 2013-04-07 MED ORDER — AMLODIPINE BESYLATE 10 MG PO TABS: ORAL_TABLET | ORAL | Status: DC

## 2013-04-07 MED ORDER — METFORMIN HCL ER 500 MG PO TB24: ORAL_TABLET | ORAL | Status: DC

## 2013-04-07 MED ORDER — METOPROLOL SUCCINATE ER 100 MG PO TB24: ORAL_TABLET | ORAL | Status: DC

## 2013-04-07 NOTE — Progress Notes (Signed)
   Subjective:    Patient ID: Jessica Mendoza, female    DOB: 02-Jun-1955, 58 y.o.   MRN: [id number]  HPI    Review of Systems     Objective:   Physical Exam        Assessment & Plan:

## 2013-04-07 NOTE — Progress Notes (Addendum)
Subjective:    Patient ID: Jessica Mendoza, female    DOB: 03-Mar-1955, 58 y.o.   MRN: [id number] This chart was scribed for Darlyne Russian, MD by Anastasia Pall, ED Scribe. This patient was seen in room 11 and the patient's care was started at 10:46 AM.  Chief Complaint  Patient presents with  . Follow-up    diabetes   HPI Jessica Mendoza is a 58 y.o. female Pt presents for DM follow up. She has type 2 DM.   She denies cough, chest pain, abdominal pain, extremity pain, and any other associated symptoms. She reports feeling healthy, states she has lost about 20 lbs. She reports having flu immunization this year. She denies having pneumonia, shingles vaccination. She denies recent travel, having any trips planned.   PCP - Jenny Reichmann, MD  Patient Active Problem List   Diagnosis Date Noted  . Obstructive sleep apnea 07/02/2012  . Chest pain, exertional 07/02/2012  . Atrial fibrillation 07/02/2012  . Hypothyroidism   . 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 Essential hypertension, benign   . Other and unspecified hyperlipidemia   . Migraines   . Anemia   . Benign positional vertigo   . Diabetes mellitus, type 2 06/13/2010  . Goiter 10/14/2006  . Colon polyps 12/13/2005   Past Medical History  Diagnosis Date  . Diabetes mellitus, type 2 06/2010  . Essential hypertension, benign   . Other and unspecified hyperlipidemia   . Migraines     menstrual  . Colon polyps 12/2005  . Focal nodular hyperplasia of liver     bx 2002  . Benign positional vertigo   . Goiter 10/2006  . Hypothyroidism   . Arthritis     WRISTS AND NECK  . Glaucoma    Past Surgical History  Procedure Laterality Date  . Liver biopsy  2002  . Breast lumpectomy      right breast- benign  . Cardioversion N/A 07/11/2012    Procedure: CARDIOVERSION;  Surgeon: Larey Dresser, MD;  Location: [facility] ENDOSCOPY;  Service: Cardiovascular;  Laterality: N/A;   Allergies  Allergen Reactions  .  Simvastatin     Myalgias  ?? lipitor    Prior to Admission medications   Medication Sig Start Date End Date Taking? Authorizing Provider  amLODipine (NORVASC) 10 MG tablet TAKE 1 TABLET BY MOUTH DAILY   Yes Darlyne Russian, MD  Coenzyme Q10 (CO Q 10) 100 MG CAPS Take by mouth.   Yes Historical Provider, MD  Fluticasone Furoate (ARNUITY ELLIPTA) 100 MCG/ACT AEPB Inhale 100 mcg into the lungs 2 (two) times daily. 03/22/13  Yes Leandrew Koyanagi, MD  levothyroxine (SYNTHROID, LEVOTHROID) 25 MCG tablet Take 25 mcg by mouth daily. NEED REFILLS   Yes Historical Provider, MD  losartan-hydrochlorothiazide (HYZAAR) 100-12.5 MG per tablet TAKE 1 TABLET BY MOUTH DAILY 03/14/13  Yes Darlyne Russian, MD  metFORMIN (GLUCOPHAGE-XR) 500 MG 24 hr tablet TAKE 1 TABLET BY MOUTH DAILY WITH BREAKFAST 12/07/12  Yes Heather M Marte, PA-C  metoprolol succinate (TOPROL-XL) 100 MG 24 hr tablet TAKE 1 TABLET BY MOUTH DAILY   Yes Darlyne Russian, MD  Multiple Vitamins-Minerals (WOMENS 50+ [place] VITAMIN/MIN) TABS Take by mouth daily.   Yes Historical Provider, MD  potassium chloride (K-DUR,KLOR-CON) 10 MEQ tablet take once daily. 01/02/13  Yes Darlyne Russian, MD  XARELTO 20 MG TABS tablet daily. 01/29/13  Yes Historical Provider, MD  albuterol (PROVENTIL HFA;VENTOLIN HFA) 108 (90  BASE) MCG/ACT inhaler Inhale 2 puffs into the lungs every 6 (six) hours as needed for wheezing or shortness of breath. 03/22/13   Leandrew Koyanagi, MD  rosuvastatin (CRESTOR) 10 MG tablet Take 10 mg by mouth once a week. 03/18/12   Darlyne Russian, MD    Review of Systems  Respiratory: Negative for cough.   Cardiovascular: Negative for chest pain and leg swelling.  Gastrointestinal: Negative for abdominal pain.  All other systems reviewed and are negative.      Objective:   Physical Exam Nursing note and vitals reviewed. Constitutional: Pt is oriented to person, place, and time. Pt appears well-developed and well-nourished. No distress.  HENT: Right  TM nl. Left TM nl. Oropharynx clear and moist, no exudate. Nose nl.  Head: Normocephalic and atraumatic.  Eyes: EOM are normal. Pupils are equal, round, and reactive to light.  Neck: Neck supple. No thyromegaly. No cervical adenopathy.  Cardiovascular: Normal rate, regular rhythm and normal heart sounds.  Exam reveals no gallop and no friction rub. No murmur heard. Pulmonary/Chest: Effort normal and breath sounds normal. No respiratory distress. Pt has no wheezes. Pt has no rales.  Abdominal: Soft. Bowel sounds are normal. There is no tenderness. There is no rebound and no guarding. No hepatosplenomegaly. No CVA tenderness.  Musculoskeletal: Normal range of motion. No tenderness. No edema.   Neurological: Pt is alert and oriented to person, place, and time.  Skin: Skin is warm and dry.  Psychiatric: Pt has a normal mood and affect. Pt's behavior is normal.   BP 115/76  Pulse 71  Temp(Src) 98.5 F (36.9 C)  Resp 16  Ht 5' 5.5" (1.664 m)  Wt 212 lb (96.163 kg)  BMI 34.73 kg/m2  SpO2 96%     Assessment & Plan:   Patient looks great she feels well. She declines a pneumonia vaccine today she declines shingles vaccine today blood pressure is at goal hemoglobin A1c had to be sent now and I will call with these results. We also did a TSH level. Fasting glucose was 112 CBC normal  **Disclaimer: This note was dictated with voice recognition software. Similar sounding words can inadvertently be transcribed and this note may contain transcription errors which may not have been corrected upon publication of note.**   I personally performed the services described in this documentation, which was scribed in my presence. The recorded information has been reviewed and is accurate.

## 2013-04-13 ENCOUNTER — Telehealth: Payer: Self-pay

## 2013-04-13 NOTE — Telephone Encounter (Signed)
Patient informed of labs, patient states understanding.

## 2013-04-21 ENCOUNTER — Telehealth: Payer: Self-pay | Admitting: Internal Medicine

## 2013-04-21 NOTE — Telephone Encounter (Signed)
New message    Patient calling asking what's the next step regarding xarelto.

## 2013-04-21 NOTE — Telephone Encounter (Signed)
At last OV with Dr. Caryl Comes, Xarelto was on hold, pt asked to see dentist to r/o other causes of bleeding from tongue/mouth. No determinant cause for bleeding in her mouth/tongue. Pt asking what is our next step now. I will review with Dr. Caryl Comes and get back with her on his recommendations. Pt agreeable to plan.

## 2013-04-21 NOTE — Telephone Encounter (Signed)
Advised to re-start Xarelto.  Pt is to go to her dentist if bleeding re-occurs so that they may see the bleeding when it is actively bleeding. She is to call us if bleeding re-occurs. Pt agreeable to plan.

## 2013-05-18 ENCOUNTER — Other Ambulatory Visit: Payer: Self-pay | Admitting: Internal Medicine

## 2013-05-19 ENCOUNTER — Other Ambulatory Visit: Payer: Self-pay | Admitting: Obstetrics & Gynecology

## 2013-06-05 ENCOUNTER — Encounter: Payer: Self-pay | Admitting: Emergency Medicine

## 2013-07-08 ENCOUNTER — Encounter: Payer: Self-pay | Admitting: *Deleted

## 2013-07-08 DIAGNOSIS — L819 Disorder of pigmentation, unspecified: Secondary | ICD-10-CM | POA: Insufficient documentation

## 2013-07-08 DIAGNOSIS — A6000 Herpesviral infection of urogenital system, unspecified: Secondary | ICD-10-CM | POA: Insufficient documentation

## 2013-07-08 DIAGNOSIS — L900 Lichen sclerosus et atrophicus: Secondary | ICD-10-CM | POA: Insufficient documentation

## 2013-07-08 DIAGNOSIS — Z8639 Personal history of other endocrine, nutritional and metabolic disease: Secondary | ICD-10-CM | POA: Insufficient documentation

## 2013-07-08 DIAGNOSIS — Z8679 Personal history of other diseases of the circulatory system: Secondary | ICD-10-CM | POA: Insufficient documentation

## 2013-07-08 DIAGNOSIS — E669 Obesity, unspecified: Secondary | ICD-10-CM | POA: Insufficient documentation

## 2013-09-04 ENCOUNTER — Telehealth: Payer: Self-pay | Admitting: *Deleted

## 2013-09-04 NOTE — Telephone Encounter (Signed)
Lm for pt- Dr. Everlene Farrier wanted pt called to let her know Bone Density Scan was normal.  Results sent to scan

## 2013-09-07 ENCOUNTER — Other Ambulatory Visit: Payer: Self-pay | Admitting: Internal Medicine

## 2013-09-15 ENCOUNTER — Encounter: Payer: Self-pay | Admitting: Emergency Medicine

## 2013-09-29 ENCOUNTER — Ambulatory Visit: Payer: Self-pay | Admitting: Emergency Medicine

## 2014-01-12 ENCOUNTER — Other Ambulatory Visit: Payer: Self-pay | Admitting: Cardiovascular Disease

## 2014-02-09 ENCOUNTER — Other Ambulatory Visit: Payer: Self-pay | Admitting: Endocrinology

## 2014-02-09 ENCOUNTER — Other Ambulatory Visit: Payer: Self-pay | Admitting: Radiology

## 2014-02-09 ENCOUNTER — Other Ambulatory Visit: Payer: 59

## 2014-02-09 DIAGNOSIS — E049 Nontoxic goiter, unspecified: Secondary | ICD-10-CM

## 2014-02-10 ENCOUNTER — Other Ambulatory Visit: Payer: 59

## 2014-02-12 DIAGNOSIS — C50919 Malignant neoplasm of unspecified site of unspecified female breast: Secondary | ICD-10-CM

## 2014-02-12 DIAGNOSIS — C44501 Unspecified malignant neoplasm of skin of breast: Secondary | ICD-10-CM | POA: Insufficient documentation

## 2014-02-12 HISTORY — DX: Malignant neoplasm of unspecified site of unspecified female breast: C50.919

## 2014-02-19 ENCOUNTER — Ambulatory Visit
Admission: RE | Admit: 2014-02-19 | Discharge: 2014-02-19 | Disposition: A | Discharge status: Home / Self Care | Payer: 59 | Source: Ambulatory Visit | Attending: Endocrinology | Admitting: Endocrinology

## 2014-02-19 DIAGNOSIS — E049 Nontoxic goiter, unspecified: Secondary | ICD-10-CM

## 2014-02-23 ENCOUNTER — Other Ambulatory Visit: Payer: Self-pay | Admitting: Endocrinology

## 2014-02-23 DIAGNOSIS — E049 Nontoxic goiter, unspecified: Secondary | ICD-10-CM

## 2014-02-24 ENCOUNTER — Telehealth: Payer: Self-pay | Admitting: Internal Medicine

## 2014-02-24 NOTE — Telephone Encounter (Signed)
New message      Request for surgical clearance:  1. What type of surgery is being performed? Thyroid ultrasound and biopsy  2. When is this surgery scheduled?03-11-14 and [facility]  3. Are there any medications that need to be held prior to surgery and how long? Hold xarelto 1 day prior to appt  4. Name of physician performing surgery?  Dr Chalmers Cater  5. What is your office phone and fax number? Fax to [phone] attn Caren Griffins

## 2014-02-24 NOTE — Telephone Encounter (Signed)
New Message  Pt's brother was dx;'d with a hereditary condition, and was told to have pt let MD know of it. Pt has information on condition ready to be forwarded. Also pt is having biopsy Jan 28 at [facility] and was told to contact our office about stopping the Xarelto a day before the procedure. Please call back and discuss.(pt mentioned it may be best to call after noon today 1/13)

## 2014-02-24 NOTE — Telephone Encounter (Signed)
Patient wanted to know if she could email information she received from her brother about a hereditary condition.  She wants Dr. Caryl Comes to see this before she sees him at the end of February.  Asked patient to print and drop by office - she is agreeable. [name] will fax request to stop NOAC prior to biopsy. I informed her that we would complete paperwork once received. She verbalized understanding.

## 2014-02-25 ENCOUNTER — Encounter: Payer: Self-pay | Admitting: *Deleted

## 2014-02-26 ENCOUNTER — Encounter: Payer: Self-pay | Admitting: *Deleted

## 2014-03-02 NOTE — Telephone Encounter (Signed)
Late entry: Faxed clearance on 1-14/16 to hold Xarelto 2 days prior to ultrasound and biopsy.

## 2014-03-11 ENCOUNTER — Other Ambulatory Visit (HOSPITAL_COMMUNITY)
Admission: RE | Admit: 2014-03-11 | Discharge: 2014-03-11 | Disposition: A | Discharge status: Home / Self Care | Payer: 59 | Source: Ambulatory Visit | Attending: Interventional Radiology | Admitting: Interventional Radiology

## 2014-03-11 ENCOUNTER — Ambulatory Visit
Admission: RE | Admit: 2014-03-11 | Discharge: 2014-03-11 | Disposition: A | Discharge status: Home / Self Care | Payer: 59 | Source: Ambulatory Visit | Attending: Endocrinology | Admitting: Endocrinology

## 2014-03-11 DIAGNOSIS — E049 Nontoxic goiter, unspecified: Secondary | ICD-10-CM

## 2014-03-11 DIAGNOSIS — E041 Nontoxic single thyroid nodule: Secondary | ICD-10-CM | POA: Insufficient documentation

## 2014-04-07 ENCOUNTER — Other Ambulatory Visit: Payer: Self-pay | Admitting: Emergency Medicine

## 2014-04-08 ENCOUNTER — Other Ambulatory Visit: Payer: Self-pay

## 2014-04-12 ENCOUNTER — Encounter: Payer: Self-pay | Admitting: Internal Medicine

## 2014-04-12 ENCOUNTER — Ambulatory Visit (INDEPENDENT_AMBULATORY_CARE_PROVIDER_SITE_OTHER): Payer: 59 | Admitting: Internal Medicine

## 2014-04-12 VITALS — BP 114/66 | HR 75 | Ht 66.0 in | Wt 224.8 lb

## 2014-04-12 DIAGNOSIS — I480 Paroxysmal atrial fibrillation: Secondary | ICD-10-CM

## 2014-04-12 DIAGNOSIS — Z8249 Family history of ischemic heart disease and other diseases of the circulatory system: Secondary | ICD-10-CM

## 2014-04-12 NOTE — Patient Instructions (Signed)
Your physician recommends that you continue on your current medications as directed. Please refer to the Current Medication list given to you today.  Your physician wants you to follow-up in: 6 months with Dr. Klein. You will receive a reminder letter in the mail two months in advance. If you don't receive a letter, please call our office to schedule the follow-up appointment.  

## 2014-04-12 NOTE — Progress Notes (Signed)
skf      Patient Care Team: Gaynelle Arabian, MD as PCP - General (Family Medicine) Jacelyn Pi, MD (Endocrinology) Inda Castle, MD (Gastroenterology) Vania Rea, MD (Obstetrics and Gynecology)   HPI  Jessica Mendoza is a 59 y.o. female Seen in followup for atrial fibrillation associated with exercise intolerance. She had a CHADS2 score of 2 and a CHADS-VASc score of 3 and Rivaroxaban  she underwent cardioversion 5/14  She is intercurrently had problems with bleeding from her tongue and has stopped her  Rivaroxaban.    She has had no symptomatic atrial fibrillation  Interval Myoview was normal; AHI was 17 on sleep study  She is working on losing weight as her strategies for dealing with her sleep apnea   She is better in sinus rhythm with improved exercise tolerance.    Past Medical History  Diagnosis Date  . Diabetes mellitus, type 2 06/2010  . Essential hypertension, benign   . Other and unspecified hyperlipidemia   . Migraines     menstrual  . Colon polyps 12/2005  . Focal nodular hyperplasia of liver     bx 2002  . Benign positional vertigo   . Goiter 10/2006  . Hypothyroidism   . Arthritis     WRISTS AND NECK  . Glaucoma   . OSA (obstructive sleep apnea)   . Afib   . Obesity   . Family history of hypertrophic cardiomyopathy     Past Surgical History  Procedure Laterality Date  . Liver biopsy  2002  . Breast lumpectomy      right breast- benign  . Cardioversion N/A 07/11/2012    Procedure: CARDIOVERSION;  Surgeon: Larey Dresser, MD;  Location: [facility];  Service: Cardiovascular;  Laterality: N/A;    Current Outpatient Prescriptions  Medication Sig Dispense Refill  . amLODipine (NORVASC) 10 MG tablet Take 1 tablet (10 mg total) by mouth daily. PATIENT NEEDS OFFICE VISIT FOR ADDITIONAL REFILLS 30 tablet 0  . Cholecalciferol (VITAMIN D-3) 1000 UNITS CAPS Take 1 capsule by mouth 3 (three) times a week.    . Coenzyme Q10 (CO Q 10) 100 MG  CAPS Take by mouth.    . levothyroxine (SYNTHROID, LEVOTHROID) 25 MCG tablet Take 25 mcg by mouth daily. NEED REFILLS    . LIVALO 1 MG TABS Take 1 tablet by mouth every other day.  0  . losartan-hydrochlorothiazide (HYZAAR) 100-12.5 MG per tablet Take 1 tablet by mouth daily. PATIENT NEEDS OFFICE VISIT FOR ADDITIONAL REFILLS 30 tablet 0  . metFORMIN (GLUCOPHAGE-XR) 500 MG 24 hr tablet Take 1 tablet (500 mg total) by mouth daily with breakfast. PATIENT NEEDS OFFICE VISIT FOR ADDITIONAL REFILLS 60 tablet 0  . metoprolol succinate (TOPROL-XL) 100 MG 24 hr tablet Take 1 tablet (100 mg total) by mouth daily. PATIENT NEEDS OFFICE VISIT FOR ADDITIONAL REFILLS 30 tablet 0  . Multiple Vitamins-Minerals (WOMENS 50+ MULTI VITAMIN/MIN) TABS Take by mouth daily.    . potassium chloride (K-DUR,KLOR-CON) 10 MEQ tablet take once daily.    . rivaroxaban (XARELTO) 20 MG TABS tablet Take 20 mg by mouth daily.    . rosuvastatin (CRESTOR) 10 MG tablet Take 10 mg by mouth once a week.     No current facility-administered medications for this visit.    Allergies  Allergen Reactions  . Simvastatin     Myalgias  ?? lipitor     Review of Systems negative except from HPI and PMH  Physical Exam BP 114/66 mmHg  Pulse  75  Ht 5\' 6"  (1.676 m)  Wt 224 lb 12.8 oz (101.969 kg)  BMI 36.30 kg/m2 Well developed and well nourished in no acute distress HENT normal E scleral and icterus clear Neck Supple JVP flat; carotids brisk and full Clear to ausculation  *Regular rate and rhythm, no murmurs gallops or rub Soft with active bowel sounds No clubbing cyanosis none Edema Alert and oriented, grossly normal motor and sensory function Skin Warm and Dry  ECG demonstrates sinus rhythm at 72 intervals 19/07/40  R prime in lead V1  Assessment and  Plan  Hypertension well controlled   Atrial fibrillation-paroxysmal no symptomatic recurrences  We had a lengthy discussion regarding anticoagulation and the risks and  benefits. Ishe is currently on Rivaroxaban   Sleep apnea treated with weight loss I've also told her that Dr. Alanson Puls is using oral contractions for people with intermediate sleep apnea and age eyes less than 20-25  Obesity As above  Tongue bleeding As above  Family history of HCM  We reviewed the genetic transmission of HCM, the floor implications of this diagnosis within the context of children as her brother has been diagnosed with presumed HCM is received a defibrillator. The patient's echo herself has minimal LVH with wall thicknesses of less than or equal to 12 mm which in the context of her hypertension is probably well within expectation. I am not sure that any other testing need be done in her. If the gene test is positive in her brother I would Probe the first degree relatives; if it were negative I would scan her daughter once because of the possibility genotypic-phenotypic discordance.2  We spent more than 50% of our 45 min visit in face to face counseling regarding the above

## 2014-04-26 ENCOUNTER — Encounter: Payer: Self-pay | Admitting: Internal Medicine

## 2014-04-27 ENCOUNTER — Encounter: Payer: Self-pay | Admitting: Internal Medicine

## 2014-04-27 ENCOUNTER — Other Ambulatory Visit: Payer: Self-pay | Admitting: *Deleted

## 2014-04-27 MED ORDER — RIVAROXABAN 20 MG PO TABS: 20.0000 mg | ORAL_TABLET | Freq: Every day | ORAL | Status: DC

## 2014-04-27 NOTE — Telephone Encounter (Signed)
Called patient and informed prescription sent to [facility] per her request. She says thank you

## 2014-04-29 ENCOUNTER — Other Ambulatory Visit: Payer: Self-pay | Admitting: *Deleted

## 2014-04-29 MED ORDER — RIVAROXABAN 20 MG PO TABS: 20.0000 mg | ORAL_TABLET | Freq: Every day | ORAL | Status: DC

## 2014-04-29 NOTE — Telephone Encounter (Signed)
Called patient and explained that after i talked with her insurance - it is not being denied.  It is the pharmacy NDC# that is the problem.  I did research and found another pharmacy she can get this medication filled at. Sent in new rx, made patient aware, patient is agreeable and thankful for helping with this.

## 2014-05-10 ENCOUNTER — Other Ambulatory Visit: Payer: Self-pay | Admitting: Emergency Medicine

## 2014-05-24 ENCOUNTER — Other Ambulatory Visit: Payer: Self-pay | Admitting: Emergency Medicine

## 2014-09-16 ENCOUNTER — Other Ambulatory Visit: Payer: Self-pay | Admitting: Radiology

## 2014-09-20 ENCOUNTER — Telehealth: Payer: Self-pay | Admitting: *Deleted

## 2014-09-20 DIAGNOSIS — C50411 Malignant neoplasm of upper-outer quadrant of right female breast: Secondary | ICD-10-CM

## 2014-09-20 NOTE — Telephone Encounter (Signed)
Confirmed [facility] for 09/22/14 at 1230 .  Instructions and contact information given.

## 2014-09-22 ENCOUNTER — Encounter: Payer: Self-pay | Admitting: Adult Health

## 2014-09-22 ENCOUNTER — Ambulatory Visit: Payer: 59

## 2014-09-22 ENCOUNTER — Ambulatory Visit (HOSPITAL_BASED_OUTPATIENT_CLINIC_OR_DEPARTMENT_OTHER): Payer: 59 | Admitting: Hematology and Oncology

## 2014-09-22 ENCOUNTER — Other Ambulatory Visit (HOSPITAL_BASED_OUTPATIENT_CLINIC_OR_DEPARTMENT_OTHER): Payer: 59

## 2014-09-22 ENCOUNTER — Ambulatory Visit: Payer: 59 | Attending: General Surgery | Admitting: Physical Therapy

## 2014-09-22 ENCOUNTER — Encounter: Payer: Self-pay | Admitting: Hematology and Oncology

## 2014-09-22 ENCOUNTER — Ambulatory Visit
Admission: RE | Admit: 2014-09-22 | Discharge: 2014-09-22 | Disposition: A | Discharge status: Home / Self Care | Payer: 59 | Source: Ambulatory Visit | Attending: Radiation Oncology | Admitting: Radiation Oncology

## 2014-09-22 ENCOUNTER — Encounter: Payer: Self-pay | Admitting: Physical Therapy

## 2014-09-22 VITALS — BP 112/60 | HR 75 | Temp 98.1°F | Resp 18 | Ht 65.0 in | Wt 219.2 lb

## 2014-09-22 DIAGNOSIS — C50411 Malignant neoplasm of upper-outer quadrant of right female breast: Secondary | ICD-10-CM | POA: Insufficient documentation

## 2014-09-22 DIAGNOSIS — Z170 Estrogen receptor positive status [ER+]: Secondary | ICD-10-CM

## 2014-09-22 DIAGNOSIS — R293 Abnormal posture: Secondary | ICD-10-CM | POA: Diagnosis present

## 2014-09-22 DIAGNOSIS — M25511 Pain in right shoulder: Secondary | ICD-10-CM | POA: Diagnosis present

## 2014-09-22 LAB — COMPREHENSIVE METABOLIC PANEL (CC13)
ALT: 61 U/L — ABNORMAL HIGH (ref 0–55)
AST: 37 U/L — ABNORMAL HIGH (ref 5–34)
Albumin: 4.2 g/dL (ref 3.5–5.0)
Alkaline Phosphatase: 87 U/L (ref 40–150)
Anion Gap: 9 mEq/L (ref 3–11)
BUN: 11.9 mg/dL (ref 7.0–26.0)
CO2: 29 mEq/L (ref 22–29)
Calcium: 9.3 mg/dL (ref 8.4–10.4)
Chloride: 102 mEq/L (ref 98–109)
Creatinine: 1.1 mg/dL (ref 0.6–1.1)
EGFR: 61 mL/min/{1.73_m2} — ABNORMAL LOW (ref >90)
Glucose: 116 mg/dL (ref 70–140)
Potassium: 3.6 mEq/L (ref 3.5–5.1)
Sodium: 140 mEq/L (ref 136–145)
Total Bilirubin: 0.64 mg/dL (ref 0.20–1.20)
Total Protein: 7.4 g/dL (ref 6.4–8.3)

## 2014-09-22 LAB — CBC WITH DIFFERENTIAL/PLATELET
BASO%: 0.4 % (ref 0.0–2.0)
Basophils Absolute: 0 10*3/uL (ref 0.0–0.1)
EOS%: 3.2 % (ref 0.0–7.0)
Eosinophils Absolute: 0.2 10*3/uL (ref 0.0–0.5)
HCT: 39.1 % (ref 34.8–46.6)
HGB: 12.8 g/dL (ref 11.6–15.9)
LYMPH%: 40.5 % (ref 14.0–49.7)
MCH: 27.4 pg (ref 25.1–34.0)
MCHC: 32.6 g/dL (ref 31.5–36.0)
MCV: 83.9 fL (ref 79.5–101.0)
MONO#: 0.3 10*3/uL (ref 0.1–0.9)
MONO%: 6.4 % (ref 0.0–14.0)
NEUT#: 2.7 10*3/uL (ref 1.5–6.5)
NEUT%: 49.5 % (ref 38.4–76.8)
Platelets: 251 10*3/uL (ref 145–400)
RBC: 4.67 10*6/uL (ref 3.70–5.45)
RDW: 15.4 % — ABNORMAL HIGH (ref 11.2–14.5)
WBC: 5.5 10*3/uL (ref 3.9–10.3)
lymph#: 2.2 10*3/uL (ref 0.9–3.3)

## 2014-09-22 NOTE — Progress Notes (Signed)
Radiation Oncology         [phone] ________________________________  Initial outpatient Consultation  Name: Jessica Mendoza MRN: [id number]  Date: 09/22/2014  DOB: Apr 29, 1955  WU:[id number],NWGNFA R, MD  Glenna Fellows, MD   REFERRING PHYSICIAN: Glenna Fellows, MD  DIAGNOSIS:    ICD-9-CM ICD-10-CM   1. 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   HISTORY OF PRESENT ILLNESS:Jessica Mendoza is a 59 y.o. female who presented with a right breast mass on screening mammogram. On ultrasound a 1.6cm mass was appreciated at 11:00 o'clock position of the right breast. The axilla was negative on ultrasound. Biospy of the right breast mass revealed invasive ductal carcinoma with characteristics as described above in the diagnosis.  She is otherwise in her USOH, without complaints   PREVIOUS RADIATION THERAPY: No  PAST MEDICAL HISTORY:  has a past medical history of Diabetes mellitus, type 2 (06/2010); Essential hypertension, benign; Other and unspecified hyperlipidemia; Migraines; Colon polyps (12/2005); Focal nodular hyperplasia of liver; Benign positional vertigo; Goiter (10/2006); Hypothyroidism; Arthritis; Glaucoma; OSA (obstructive sleep apnea); Afib; Obesity; Family history of hypertrophic cardiomyopathy; and Hot flashes.    PAST SURGICAL HISTORY: Past Surgical History  Procedure Laterality Date  . Liver biopsy  2002  . Breast lumpectomy      right breast- benign  . Cardioversion N/A 07/11/2012    Procedure: CARDIOVERSION;  Surgeon: Laurey Morale, MD;  Location: [facility] ENDOSCOPY;  Service: Cardiovascular;  Laterality: N/A;    FAMILY HISTORY: family history includes Alcohol abuse in her father; Breast cancer in her cousin; Cancer in her maternal aunt and maternal grandfather; Heart attack in her mother; Hyperlipidemia in her  brother; Hypertension in her brother and mother; Ovarian cancer in her cousin and maternal aunt; Pancreatic cancer in her maternal grandfather.  SOCIAL HISTORY:  reports that she quit smoking about 26 years ago. She does not have any smokeless tobacco history on file. She reports that she drinks alcohol. She reports that she does not use illicit drugs.  ALLERGIES: Simvastatin  MEDICATIONS:  Current Outpatient Prescriptions  Medication Sig Dispense Refill  . amLODipine (NORVASC) 10 MG tablet Take 1 tablet (10 mg total) by mouth daily. NO MORE REFILLS WITHOUT OFFICE VISIT - 2ND NOTICE 15 tablet 0  . Cholecalciferol (VITAMIN D-3) 1000 UNITS CAPS Take 1 capsule by mouth 3 (three) times a week.    . Coenzyme Q10 (CO Q 10) 100 MG CAPS Take by mouth.    . levothyroxine (SYNTHROID, LEVOTHROID) 25 MCG tablet Take 25 mcg by mouth daily. NEED REFILLS    . LIVALO 1 MG TABS Take 1 tablet by mouth every other day.  0  . losartan-hydrochlorothiazide (HYZAAR) 100-12.5 MG per tablet Take 1 tablet by mouth daily. NO MORE REFILLS WITHOUT OFFICE VISIT - 2ND NOTICE 15 tablet 0  . meloxicam (MOBIC) 15 MG tablet TK 1 T PO QD PC  2  . metFORMIN (GLUCOPHAGE-XR) 500 MG 24 hr tablet Take 1 tablet (500 mg total) by mouth daily with breakfast. NO MORE REFILLS WITHOUT OFFICE VISIT - 2ND NOTICE 15 tablet 0  . metoprolol succinate (TOPROL-XL) 100 MG 24 hr tablet Take 1 tablet (100 mg total) by mouth daily. NO MORE REFILLS WITHOUT OFFICE VISIT - 2ND NOTICE 15 tablet 0  . Multiple Vitamins-Minerals (WOMENS 50+ MULTI VITAMIN/MIN) TABS Take by mouth daily.    Marland Kitchen  rivaroxaban (XARELTO) 20 MG TABS tablet Take 1 tablet (20 mg total) by mouth daily. 30 tablet 6   No current facility-administered medications for this encounter.    REVIEW OF SYSTEMS:  Notable for that above.   PHYSICAL EXAM:   Vitals - 1 value per visit 09/22/2014  SYSTOLIC 112  DIASTOLIC 60  Pulse 75  Temperature 98.1  Respirations 18  Weight (lb) 219.2   Height 5\' 5"   BMI 36.48  VISIT REPORT    General: Alert and oriented, in no acute distress HEENT: Head is normocephalic. Extraocular movements are intact. Oropharynx is clear. Neck: Neck is supple, no palpable cervical or supraclavicular lymphadenopathy. Heart: Regular in rate and rhythm with no murmurs, rubs, or gallops. Chest: Clear to auscultation bilaterally, with no rhonchi, wheezes, or rales. Abdomen: Soft, nontender, nondistended, with no rigidity or guarding. Lymphatics: see Neck Exam Skin: No concerning lesions. Musculoskeletal: symmetric strength and muscle tone throughout. Neurologic: Cranial nerves II through XII are grossly intact. No obvious focalities. Speech is fluent. Coordination is intact. Psychiatric: Judgment and insight are intact. Affect is appropriate. Breasts: palpable density in UOQ of right breast, subtle and hard to measure; 1-1.5cm area of thickening at 2:30 region of right breast, lateral to sternum; no other axillary or breast masses palpated  ECOG = 0  0 - Asymptomatic (Fully active, able to carry on all predisease activities without restriction)  1 - Symptomatic but completely ambulatory (Restricted in physically strenuous activity but ambulatory and able to carry out work of a light or sedentary nature. For example, light housework, office work)  2 - Symptomatic, <50% in bed during the day (Ambulatory and capable of all self care but unable to carry out any work activities. Up and about more than 50% of waking hours)  3 - Symptomatic, >50% in bed, but not bedbound (Capable of only limited self-care, confined to bed or chair 50% or more of waking hours)  4 - Bedbound (Completely disabled. Cannot carry on any self-care. Totally confined to bed or chair)  5 - Death   Santiago Glad MM, Creech RH, Tormey DC, et al. [phone]). "Toxicity and response criteria of the [facility]". Am. Evlyn Clines. Oncol. 5 (6): 649-55   LABORATORY DATA:  Lab  Results  Component Value Date   WBC 5.5 09/22/2014   HGB 12.8 09/22/2014   HCT 39.1 09/22/2014   MCV 83.9 09/22/2014   PLT 251 09/22/2014   CMP     Component Value Date/Time   NA 140 09/22/2014 1248   NA 142 04/07/2013 1058   K 3.6 09/22/2014 1248   K 4.0 04/07/2013 1058   CL 102 04/07/2013 1058   CO2 29 09/22/2014 1248   CO2 29 04/07/2013 1058   GLUCOSE 116 09/22/2014 1248   GLUCOSE 118* 04/07/2013 1058   BUN 11.9 09/22/2014 1248   BUN 9 04/07/2013 1058   CREATININE 1.1 09/22/2014 1248   CREATININE 0.79 04/07/2013 1058   CREATININE 0.80 07/11/2012 1049   CALCIUM 9.3 09/22/2014 1248   CALCIUM 9.2 04/07/2013 1058   PROT 7.4 09/22/2014 1248   PROT 7.2 04/07/2013 1058   ALBUMIN 4.2 09/22/2014 1248   ALBUMIN 4.3 04/07/2013 1058   AST 37* 09/22/2014 1248   AST 23 04/07/2013 1058   ALT 61* 09/22/2014 1248   ALT 38* 04/07/2013 1058   ALKPHOS 87 09/22/2014 1248   ALKPHOS 74 04/07/2013 1058   BILITOT 0.64 09/22/2014 1248   BILITOT 0.8 04/07/2013 1058   GFRNONAA 83 04/07/2013  1058   GFRNONAA 80* 07/11/2012 1049   GFRAA >89 04/07/2013 1058   GFRAA >90 07/11/2012 1049         RADIOGRAPHY: No results found.    IMPRESSION/PLAN: Stage I breast cancer, ER+  [place] of right breast 2:30 region of thickening has been ordered as a precaution.  Genetics ordered in light of family history.  The patient has been discussed this morning at tumor board. She is a good candidate for lumpectomy and sentinel lymph node biopsy. Oncotype will be ordered. We discussed my recommended for adjuvant right breast radiotherapy, which would decrease her risk of a locoregional recurrence by two-thirds. I look forward to seeing her back in my clinic when she is ready for treatment planning. If the patient develops any further questions or concerns in regards to her treatment, she has been advised and encouraged to contact Dr. Basilio Cairo, MD.     This document serves as a record of services personally  performed by Lonie Peak, MD. It was created on her behalf by Netta Neat, a trained medical scribe. The creation of this record is based on the scribe's personal observations and the provider's statements to them. This document has been checked and approved by the attending provider.  __________________________________________  Lonie Peak, MD

## 2014-09-22 NOTE — Assessment & Plan Note (Signed)
Right breast biopsy 09/16/2014: Invasive ductal carcinoma, grade 2, ER 100%, PR 0%, HER-2 negative, Ki-67 10%; by ultrasound measures 1.6 cm T1 cN0 M0 stage IA clinical stage  Pathology and radiology counseling:Discussed with the patient, the details of pathology including the type of breast cancer,the clinical staging, the significance of ER, PR and HER-2/neu receptors and the implications for treatment. After reviewing the pathology in detail, we proceeded to discuss the different treatment options between surgery, radiation, chemotherapy, antiestrogen therapies.  Recommendations: 1. Breast conserving surgery followed by 2. Oncotype DX testing to determine if chemotherapy would be of any benefit followed by 3. Adjuvant radiation therapy followed by 4. Adjuvant antiestrogen therapy  Oncotype counseling: I discussed Oncotype DX test. I explained to the patient that this is a 21 gene panel to evaluate patient tumors DNA to calculate recurrence score. This would help determine whether patient has high risk or intermediate risk or low risk breast cancer. She understands that if her tumor was found to be high risk, she would benefit from systemic chemotherapy. If low risk, no need of chemotherapy. If she was found to be intermediate risk, we would need to evaluate the score as well as other risk factors and determine if an abbreviated chemotherapy may be of benefit.  Return to clinic after surgery to discuss the final pathology result and then determine if Oncotype DX needs to be sent. Return to clinic after surgery to discuss final pathology report and then determine if Oncotype DX testing will need to be sent.

## 2014-09-22 NOTE — Patient Instructions (Signed)

## 2014-09-22 NOTE — Progress Notes (Signed)
[facility] NOTE  Patient Care Team: Gaynelle Arabian, MD as PCP - General (Family Medicine) Jacelyn Pi, MD (Endocrinology) Inda Castle, MD (Gastroenterology) Vania Rea, MD (Obstetrics and Gynecology) Excell Seltzer, MD as Consulting Physician (General Surgery) Nicholas Lose, MD as Consulting Physician (Hematology and Oncology) Eppie Gibson, MD as Attending Physician (Radiation Oncology) Mauro Kaufmann, RN as Registered Nurse Rockwell Germany, RN as Registered Nurse Holley Bouche, NP as Nurse Practitioner (Nurse Practitioner) Sylvan Cheese, NP as Nurse Practitioner (Nurse Practitioner)  CHIEF COMPLAINTS/PURPOSE OF CONSULTATION:  Newly diagnosed breast cancer  HISTORY OF PRESENTING ILLNESS:  Jessica Mendoza 59 y.o. female is here because of recent diagnosis of right breast cancer. Patient had a routine screening mammogram that revealed a right breast mass measuring 1.6 cm by ultrasound. 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 She has a maternal cousin age 51 with breast cancer. She does not know if genetic test was done on her maternal cousin.  I reviewed her records extensively and collaborated the history with the patient.  SUMMARY OF ONCOLOGIC HISTORY:   Breast cancer of upper-outer quadrant of right female breast   09/16/2014 Initial Diagnosis Right breast biopsy: Invasive ductal carcinoma, grade 2, ER 100%, PR 0%, HER-2 negative, Ki-67 10%; by ultrasound measures 1.6 cm T1 cN0 M0 stage IA clinical stage    In terms of breast cancer risk profile:  She menarched at early age of 89 and went to menopause at age 16  She had one pregnancy, her first child was born at age 59  She has received birth control pills for approximately 5 years.  She was never exposed to fertility medications or hormone replacement therapy.  She has   family history of Breast/GYN/GI cancer Maternal cousin age 74 with breast cancer  MEDICAL HISTORY:  Past Medical History  Diagnosis Date  . Diabetes mellitus, type 2 06/2010  . Essential hypertension, benign   . Other and unspecified hyperlipidemia   . Migraines     menstrual  . Colon polyps 12/2005  . Focal nodular hyperplasia of liver     bx 2002  . Benign positional vertigo   . Goiter 10/2006  . Hypothyroidism   . Arthritis     WRISTS AND NECK  . Glaucoma   . OSA (obstructive sleep apnea)   . Afib   . Obesity   . Family history of hypertrophic cardiomyopathy   . Hot flashes     SURGICAL HISTORY: Past Surgical History  Procedure Laterality Date  . Liver biopsy  2002  . Breast lumpectomy      right breast- benign  . Cardioversion N/A 07/11/2012    Procedure: CARDIOVERSION;  Surgeon: Larey Dresser, MD;  Location: [facility];  Service: Cardiovascular;  Laterality: N/A;    SOCIAL HISTORY: Social History   Social History  . Marital Status: Married    Spouse Name: N/A  . Number of Children: 1  . Years of Education: N/A   Occupational History  . [employer]    Social History Main Topics  . Smoking status: Former Smoker -- 25 years    Quit date: 02/13/1988  . Smokeless tobacco: Not on file  . Alcohol Use: Yes     Comment: occas  . Drug Use: No  . Sexual Activity: Yes    Birth Control/ Protection: None   Other  Topics Concern  . Not on file   Social History Narrative    FAMILY HISTORY: Family History  Problem Relation Age of Onset  . Hypertension Mother   . Heart attack Mother   . Alcohol abuse Father   . Hypertension Brother   . Hyperlipidemia Brother   . Ovarian cancer Maternal Aunt   . Cancer Maternal Aunt     Uterine cancer  . Pancreatic cancer Maternal Grandfather   . Cancer Maternal Grandfather     Pancreatic  . Breast cancer Cousin   . Ovarian cancer Cousin     ALLERGIES:  is allergic to simvastatin.  MEDICATIONS:   Current Outpatient Prescriptions  Medication Sig Dispense Refill  . amLODipine (NORVASC) 10 MG tablet Take 1 tablet (10 mg total) by mouth daily. NO MORE REFILLS WITHOUT OFFICE VISIT - 2ND NOTICE 15 tablet 0  . Cholecalciferol (VITAMIN D-3) 1000 UNITS CAPS Take 1 capsule by mouth 3 (three) times a week.    . Coenzyme Q10 (CO Q 10) 100 MG CAPS Take by mouth.    . levothyroxine (SYNTHROID, LEVOTHROID) 25 MCG tablet Take 25 mcg by mouth daily. NEED REFILLS    . LIVALO 1 MG TABS Take 1 tablet by mouth every other day.  0  . losartan-hydrochlorothiazide (HYZAAR) 100-12.5 MG per tablet Take 1 tablet by mouth daily. NO MORE REFILLS WITHOUT OFFICE VISIT - 2ND NOTICE 15 tablet 0  . meloxicam (MOBIC) 15 MG tablet TK 1 T PO QD PC  2  . metFORMIN (GLUCOPHAGE-XR) 500 MG 24 hr tablet Take 1 tablet (500 mg total) by mouth daily with breakfast. NO MORE REFILLS WITHOUT OFFICE VISIT - 2ND NOTICE 15 tablet 0  . metoprolol succinate (TOPROL-XL) 100 MG 24 hr tablet Take 1 tablet (100 mg total) by mouth daily. NO MORE REFILLS WITHOUT OFFICE VISIT - 2ND NOTICE 15 tablet 0  . Multiple Vitamins-Minerals (WOMENS 50+ MULTI VITAMIN/MIN) TABS Take by mouth daily.    . rivaroxaban (XARELTO) 20 MG TABS tablet Take 1 tablet (20 mg total) by mouth daily. 30 tablet 6   No current facility-administered medications for this visit.    REVIEW OF SYSTEMS:   Constitutional: Denies fevers, chills or abnormal night sweats Eyes: Denies blurriness of vision, double vision or watery eyes Ears, nose, mouth, throat, and face: Denies mucositis or sore throat Respiratory: Denies cough, dyspnea or wheezes Cardiovascular: Denies palpitation, chest discomfort or lower extremity swelling Gastrointestinal:  Denies nausea, heartburn or change in bowel habits Skin: Denies abnormal skin rashes Lymphatics: Denies new lymphadenopathy or easy bruising Neurological:Denies numbness, tingling or new weaknesses Behavioral/Psych: Mood is stable, no  new changes  Breast:  Denies any palpable lumps or discharge All other systems were reviewed with the patient and are negative.  PHYSICAL EXAMINATION: ECOG PERFORMANCE STATUS: 1 - Symptomatic but completely ambulatory  Filed Vitals:   09/22/14 1300  BP: 112/60  Pulse: 75  Temp: 98.1 F (36.7 C)  Resp: 18   Filed Weights   09/22/14 1300  Weight: 219 lb 3.2 oz (99.428 kg)    GENERAL:alert, no distress and comfortable SKIN: skin color, texture, turgor are normal, no rashes or significant lesions EYES: normal, conjunctiva are pink and non-injected, sclera clear OROPHARYNX:no exudate, no erythema and lips, buccal mucosa, and tongue normal  NECK: supple, thyroid normal size, non-tender, without nodularity LYMPH:  no palpable lymphadenopathy in the cervical, axillary or inguinal LUNGS: clear to auscultation and percussion with normal breathing effort HEART: regular rate & rhythm and no  murmurs and no lower extremity edema ABDOMEN:abdomen soft, non-tender and normal bowel sounds Musculoskeletal:no cyanosis of digits and no clubbing  PSYCH: alert & oriented x 3 with fluent speech NEURO: no focal motor/sensory deficits BREAST: No palpable nodules in breast. No palpable axillary or supraclavicular lymphadenopathy (exam performed in the presence of a chaperone) I did not feel any nodules but Dr. Excell Seltzer felt a lump which will be further evaluated with a ultrasound and biopsy  LABORATORY DATA:  I have reviewed the data as listed Lab Results  Component Value Date   WBC 5.5 09/22/2014   HGB 12.8 09/22/2014   HCT 39.1 09/22/2014   MCV 83.9 09/22/2014   PLT 251 09/22/2014   Lab Results  Component Value Date   NA 140 09/22/2014   K 3.6 09/22/2014   CL 102 04/07/2013   CO2 29 09/22/2014   ASSESSMENT AND PLAN:  Breast cancer of upper-outer quadrant of right female breast Right breast biopsy 09/16/2014: Invasive ductal carcinoma, grade 2, ER 100%, PR 0%, HER-2 negative, Ki-67 10%; by  ultrasound measures 1.6 cm T1 cN0 M0 stage IA clinical stage  Pathology and radiology counseling:Discussed with the patient, the details of pathology including the type of breast cancer,the clinical staging, the significance of ER, PR and HER-2/neu receptors and the implications for treatment. After reviewing the pathology in detail, we proceeded to discuss the different treatment options between surgery, radiation, chemotherapy, antiestrogen therapies.  Recommendations: 1. Breast conserving surgery followed by 2. Oncotype DX testing to determine if chemotherapy would be of any benefit followed by 3. Adjuvant radiation therapy followed by 4. Adjuvant antiestrogen therapy  Oncotype counseling: I discussed Oncotype DX test. I explained to the patient that this is a 21 gene panel to evaluate patient tumors DNA to calculate recurrence score. This would help determine whether patient has high risk or intermediate risk or low risk breast cancer. She understands that if her tumor was found to be high risk, she would benefit from systemic chemotherapy. If low risk, no need of chemotherapy. If she was found to be intermediate risk, we would need to evaluate the score as well as other risk factors and determine if an abbreviated chemotherapy may be of benefit.  Return to clinic after surgery to discuss the final pathology result and then determine if Oncotype DX needs to be sent. Return to clinic after surgery to discuss final pathology report and then determine if Oncotype DX testing will need to be sent.     All questions were answered. The patient knows to call the clinic with any problems, questions or concerns.    Rulon Eisenmenger, MD 3:47 PM

## 2014-09-22 NOTE — Progress Notes (Signed)
Ms. Bigler is a very pleasant 59 y.o. female from [place], [place] with newly diagnosed grade 2 invasive ductal carcinoma of the right breast.  Biopsy results revealed the tumor's prognostic profile is ER positive, PR negative, and HER2/neu negative. Ki67 is 10%.  She presents today with her husband to [facility]) for treatment consideration and recommendations from the breast surgeon, radiation oncologist, and medical oncologist.     I briefly met with Ms. Wooton and her husband during her [facility] visit today. We discussed the purpose of the Survivorship Clinic, which will include monitoring for recurrence, coordinating completion of age and gender-appropriate cancer screenings, promotion of overall wellness, as well as managing potential late/long-term side effects of anti-cancer treatments.    The treatment plan for Ms. Sytsma will likely include surgery, radiation therapy, and anti-estrogen therapy.  She will meet with the Genetics Counselor due to her family history of cancer. As of today, the intent of treatment for Ms. Harman is cure, therefore she will be eligible for the Survivorship Clinic upon her completion of treatment.  Her survivorship care plan (SCP) document will be drafted and updated throughout the course of her treatment trajectory. She will receive the SCP in an office visit with our breast survivorship NP in the Survivorship Clinic once she has completed treatment.   Ms. Nations was encouraged to ask questions and all questions were answered to her satisfaction.  She was given my business card and encouraged to contact me with any concerns regarding survivorship.  We look forward to participating in her care.   Mike Craze, NP [place] [phone]

## 2014-09-22 NOTE — Progress Notes (Signed)
MD note created during office visit provided to patient.  Copy to scan.

## 2014-09-22 NOTE — Therapy (Signed)
[place], [place], [address] Phone: [phone]   Fax:  [phone]  Physical Therapy Evaluation  Patient Details  Name: Jessica Mendoza MRN: [id number] Date of Birth: November 12, 1955 Referring Provider:  Excell Seltzer, MD  Encounter Date: 09/22/2014      PT End of Session - 09/22/14 1541    Visit Number 1   Number of Visits 1   PT Start Time 1420   PT Stop Time 1448   PT Time Calculation (min) 28 min   Activity Tolerance Patient tolerated treatment well   Behavior During Therapy [facility] for tasks assessed/performed      Past Medical History  Diagnosis Date  . Diabetes mellitus, type 2 06/2010  . Essential hypertension, benign   . Other and unspecified hyperlipidemia   . Migraines     menstrual  . Colon polyps 12/2005  . Focal nodular hyperplasia of liver     bx 2002  . Benign positional vertigo   . Goiter 10/2006  . Hypothyroidism   . Arthritis     WRISTS AND NECK  . Glaucoma   . OSA (obstructive sleep apnea)   . Afib   . Obesity   . Family history of hypertrophic cardiomyopathy   . Hot flashes     Past Surgical History  Procedure Laterality Date  . Liver biopsy  2002  . Breast lumpectomy      right breast- benign  . Cardioversion N/A 07/11/2012    Procedure: CARDIOVERSION;  Surgeon: Larey Dresser, MD;  Location: [facility];  Service: Cardiovascular;  Laterality: N/A;    There were no vitals filed for this visit.  Visit Diagnosis:  Carcinoma of upper-outer quadrant of right female breast - Plan: PT plan of care cert/re-cert  Right shoulder pain - Plan: PT plan of care cert/re-cert  Abnormal posture - Plan: PT plan of care cert/re-cert      Subjective Assessment - 09/22/14 1542    Pertinent History Patient was diagnosed 09/08/14 with right upper outer quadrant grade 2 invasive ductal carcinoma breast cancer.  It measures 1.6 cm in size, is ER positive, PR negative, HER2  negative, and has a Ki67 of 10%.              [facility] PT Assessment - 09/22/14 0001    Assessment   Medical Diagnosis Right breast cancer   Onset Date/Surgical Date 09/08/14   Hand Dominance Right   Prior Therapy none   Precautions   Precautions Other (comment)  Active breast cancer   Restrictions   Weight Bearing Restrictions No   Balance Screen   Has the patient fallen in the past 6 months No   Has the patient had a decrease in activity level because of a fear of falling?  No   Is the patient reluctant to leave their home because of a fear of falling?  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; on computer most of the time   Leisure She walks 20 minutes and does the elliptical 10 minutes 3x/week   Cognition   Overall Cognitive Status Within Functional Limits for tasks assessed   Posture/Postural Control   Posture/Postural Control Postural limitations   Postural Limitations Rounded Shoulders;Forward head  ROM / Strength   AROM / PROM / Strength AROM;Strength   AROM   AROM Assessment Site Shoulder   Right/Left Shoulder Right;Left   Right Shoulder Extension 47 Degrees   Right Shoulder Flexion 135 Degrees  Painful end range   Right Shoulder ABduction 140 Degrees   Right Shoulder Internal Rotation 47 Degrees  tight and painful   Right Shoulder External Rotation 90 Degrees   Left Shoulder Extension 47 Degrees   Left Shoulder Flexion 146 Degrees   Left Shoulder ABduction 161 Degrees   Left Shoulder Internal Rotation 63 Degrees   Left Shoulder External Rotation 90 Degrees   Strength   Overall Strength Within functional limits for tasks performed           LYMPHEDEMA/ONCOLOGY QUESTIONNAIRE - 09/22/14 1539    Type    Cancer Type Right breast cancer   Lymphedema Assessments   Lymphedema Assessments Upper extremities   Right Upper Extremity Lymphedema   10 cm Proximal to Olecranon Process 35.5 cm   Olecranon Process 29.1 cm   10 cm Proximal to Ulnar Styloid Process 25.8 cm   Just Proximal to Ulnar Styloid Process 18 cm   Across Hand at [employer] 19.6 cm   At [place] of 2nd Digit 6.7 cm   Left Upper Extremity Lymphedema   10 cm Proximal to Olecranon Process 36 cm   Olecranon Process 29.2 cm   10 cm Proximal to Ulnar Styloid Process 25.4 cm   Just Proximal to Ulnar Styloid Process 17.9 cm   Across Hand at [employer] 19.3 cm   At [place] of 2nd Digit 6.8 cm      Patient was instructed today in a home exercise program today for post op shoulder range of motion. These included active assist shoulder flexion in sitting, scapular retraction, wall walking with shoulder abduction, and hands behind head external rotation.  She was encouraged to do these twice a day, holding 3 seconds and repeating 5 times when permitted by her physician.           PT Education - 09/22/14 1540    Education provided Yes   Education Details Post op shoulder ROM HEP and lymphedema risk reduction   Person(s) Educated Patient;Spouse   Methods Explanation;Demonstration;Handout   Comprehension Returned demonstration;Verbalized understanding              Breast Clinic Goals - 09/22/14 1545    Patient will be able to verbalize understanding of pertinent lymphedema risk reduction practices relevant to her diagnosis specifically related to skin care.   Time 1   Period Days   Status Achieved   Patient will be able to return demonstrate and/or verbalize understanding of the post-op home exercise program related to regaining shoulder range of motion.   Time 1   Period Days   Status Achieved   Patient will be able to verbalize understanding of the importance of attending the postoperative After Breast Cancer Class  for further lymphedema risk reduction education and therapeutic exercise.   Time 1   Period Days   Status Achieved              Plan - 09/22/14 1542    Clinical Impression Statement Patient was diagnosed 09/08/14 with right upper outer quadrant grade 2 invasive ductal carcinoma breast cancer.  It measures 1.6 cm in size, is ER positive, PR negative, HER2 negative, and has a Ki67 of 10%.  She is planning to have a right lumpectomy and sentinel  node biopsy followed by Oncotype testing, radiation and anti-estrogen therapy.  She may benefit from post op PT to regain shoulder ROM and strength and reduce lymphedema risk.   Pt will benefit from skilled therapeutic intervention in order to improve on the following deficits Decreased range of motion;Impaired UE functional use;Pain;Decreased knowledge of precautions;Decreased strength   Rehab Potential Excellent   Clinical Impairments Affecting Rehab Potential none   PT Frequency One time visit   PT Treatment/Interventions Patient/family education;Therapeutic exercise   Consulted and Agree with Plan of Care Patient;Family member/caregiver   Family Member Consulted Husband       Patient will follow up at outpatient cancer rehab if needed following surgery.  If the patient requires physical therapy at that time, a specific plan will be dictated and sent to the referring physician for approval. The patient was educated today on appropriate basic range of motion exercises to begin post operatively and the importance of attending the After Breast Cancer class following surgery.  Patient was educated today on lymphedema risk reduction practices as it pertains to recommendations that will benefit the patient immediately following surgery.  She verbalized good understanding.  No additional physical therapy is indicated at this time.      Problem List Patient Active Problem List   Diagnosis Date Noted  . Breast cancer of upper-outer quadrant of right  female breast 09/22/2014  . Genital herpes simplex 07/08/2013  . Obesity 07/08/2013  . Lichen sclerosus et atrophicus 07/08/2013  . Hypopigmented skin lesion 07/08/2013  . History of hypothyroidism 07/08/2013  . History of hypertension 07/08/2013  . Obstructive sleep apnea 07/02/2012  . Chest pain, exertional 07/02/2012  . Atrial fibrillation 07/02/2012  . Hypothyroidism   . 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 Essential hypertension, benign   . Other and unspecified hyperlipidemia   . Migraines   . Anemia   . Benign positional vertigo   . Diabetes mellitus, type 2 06/13/2010  . Goiter 10/14/2006  . Colon polyps 12/13/2005   Annia Friendly, PT 09/22/2014 3:47 PM  [place], [place], [address] Phone: [phone]   Fax:  [phone]

## 2014-09-22 NOTE — Progress Notes (Signed)
Checked in new pt with no financial concerns prior to seeing the dr.  Informed pt if chemo is part of her treatment we will obtain auth from her insurance company as well as contact foundations that offer copay assistance if needed.  Pt took the referral application for [employer] and will return it to me if she would like me to fax or email it to them in her behalf.  She has my card for any billing questions or concerns.

## 2014-09-23 ENCOUNTER — Ambulatory Visit: Payer: 59

## 2014-09-23 ENCOUNTER — Ambulatory Visit (HOSPITAL_BASED_OUTPATIENT_CLINIC_OR_DEPARTMENT_OTHER): Payer: 59 | Admitting: Genetic Counselor

## 2014-09-23 ENCOUNTER — Encounter: Payer: Self-pay | Admitting: Genetic Counselor

## 2014-09-23 DIAGNOSIS — C50411 Malignant neoplasm of upper-outer quadrant of right female breast: Secondary | ICD-10-CM

## 2014-09-23 DIAGNOSIS — Z8041 Family history of malignant neoplasm of ovary: Secondary | ICD-10-CM | POA: Insufficient documentation

## 2014-09-23 DIAGNOSIS — C50919 Malignant neoplasm of unspecified site of unspecified female breast: Secondary | ICD-10-CM | POA: Insufficient documentation

## 2014-09-23 DIAGNOSIS — Z800 Family history of malignant neoplasm of digestive organs: Secondary | ICD-10-CM | POA: Diagnosis not present

## 2014-09-23 DIAGNOSIS — K635 Polyp of colon: Secondary | ICD-10-CM

## 2014-09-23 NOTE — Progress Notes (Addendum)
REFERRING PROVIDER: Gaynelle Arabian, MD [address], [place] [address]   Jessica Lose, MD  PRIMARY PROVIDER:  Simona Huh, MD  PRIMARY REASON FOR VISIT:  1. Breast cancer of upper-outer quadrant of right female breast   2. Family history of ovarian cancer   3. Family history of pancreatic cancer   4. Colon polyps      HISTORY OF PRESENT ILLNESS:   Jessica Mendoza, a 59 y.o. female, was seen for a [place] cancer genetics consultation at the request of Jessica Mendoza due to a personal and family history of cancer.  Jessica Mendoza presents to clinic today to discuss the possibility of a hereditary predisposition to cancer, genetic testing, and to further clarify her future cancer risks, as well as potential cancer risks for family members.   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  The tumor is ER+/PR-/Her2-.  This will be treated with surgery, possible chemotherapy and radiation. Jessica Mendoza, and has 3 nodules on her thyroid.  Her biopsy of these nodules was negative.   CANCER HISTORY:    Breast cancer of upper-outer quadrant of right female breast   09/16/2014 Initial Diagnosis Right breast biopsy: Invasive ductal carcinoma, grade 2, ER 100%, PR 0%, HER-2 negative, Ki-67 10%; by ultrasound measures 1.6 cm T1 cN0 M0 stage IA clinical stage     HORMONAL RISK FACTORS:  Menarche was at age 34.  First live birth at age 54.  OCP use for approximately 10 years.  Ovaries intact: yes.  Hysterectomy: no.  Menopausal status: postmenopausal.  HRT use: 0 years. Colonoscopy: yes; polyps were found. Mammogram within the last year: yes. Number of breast biopsies: 2-3. Up to date with pelvic exams:  yes. Any excessive radiation exposure in the past:  no  Past Medical History  Diagnosis Date  . Diabetes mellitus, type 2 06/2010  . Essential hypertension, benign   . Other and unspecified  hyperlipidemia   . Migraines     menstrual  . Colon polyps 12/2005  . Focal nodular hyperplasia of liver     bx 2002  . Benign positional vertigo   . Mendoza 10/2006  . Hypothyroidism   . Arthritis     WRISTS AND NECK  . Glaucoma   . OSA (obstructive sleep apnea)   . Afib   . Obesity   . Family history of hypertrophic cardiomyopathy   . Hot flashes   . Breast cancer 2016    ER+/PR-/Her2-  . Family history of ovarian cancer   . Family history of pancreatic cancer     Past Surgical History  Procedure Laterality Date  . Liver biopsy  2002  . Breast lumpectomy      right breast- benign  . Cardioversion N/A 07/11/2012    Procedure: CARDIOVERSION;  Surgeon: Jessica Dresser, MD;  [facility] END[facility]COPY;  Service: Cardiovascular;  Laterality: N/A;    Social History   Social History  . Marital Status: Married    Spouse Name: Jessica Mendoza  . Number of Children: 1  . Years of Education: N/A   Occupational History  . [employer]    Social History Main Topics  . Smoking status: Former Smoker -- 0.50 packs/day for 25 years    Types: Cigarettes    Quit date: 02/13/1988  . Smokeless tobacco: None  . Alcohol Use: Yes     Comment: occas  .  Drug Use: No  . Sexual Activity: Yes    Birth Control/ Protection: None   Other Topics Concern  . None   Social History Narrative     FAMILY HISTORY:  We obtained a detailed, 4-generation family history.  Significant diagnoses are listed below: Family History  Problem Relation Age of Onset  . Hypertension Mother   . Heart attack Mother   . Alcohol abuse Father   . Hypertension Brother   . Hyperlipidemia Brother   . Ovarian cancer Maternal Aunt 56  . Pancreatic cancer Maternal Grandfather 59  . Breast cancer Cousin 45    Mother's paternal first cousin  . Ovarian cancer Cousin 53    "groin cancer"  . Hypertension Paternal Grandmother   . Diabetes Paternal Grandmother   . Liver cancer Paternal Aunt 59    ETOH related    The patient has one daughter and one brother who are both cancer free.  Her mother died of a heart attack at age 18.  She had three sisters and a brother.  Her brother is alive at 33. One sister died of an ovarian cancer at 60.  This sister had a daughter, Jessica Mendoza, who recently died of cancer that was described as "groin cancer" at 46.  Jessica Mendoza. Palma' maternal grandfather died of pancreatic cancer at 23.  He had a sister, who's daughter had breast cancer at 32.  The patient's father died of alcohol related issues at 15.  He had one sister who had liver cancer at 63.  His parents died at 75 (mother) and 11 (father) from non cancer related issues.  Patient's maternal ancestors are of African [place] descent, and paternal ancestors are of African American descent. There is no reported Ashkenazi Jewish ancestry. There is no known consanguinity.  GENETIC COUNSELING ASSESSMENT: Jessica Mendoza is a 59 y.o. female with a personal history of breast cancer and family history of ovarian, pancreatic and breast cnacer which somewhat suggestive of a hereditary cancer syndrome and predisposition to cancer. We, therefore, discussed and recommended the following at today's visit.   DISCUSSION: We discussed that BRCA mutations are the most common cause of hereditary cancers that would increase the risk for breast, ovarian and pancreatic cancer.  We also discussed PALB2 and PTEN based on personal and family history.  Jessica Mendoza cousin was diagnosed with "groin cancer".  If this was uterine cancer, there could be an increased risk for Lynch syndrome mutations.  We reviewed the characteristics, features and inheritance patterns of hereditary cancer syndromes. We also discussed genetic testing, including the appropriate family members to test, the process of testing, insurance coverage and turn-around-time for results. We discussed the implications of a negative, positive and/or variant of uncertain significant result. We  recommended Jessica Mendoza pursue genetic testing for the Breast/Ovarian cancer gene panel. The Breast/Ovarian gene panel offered by [facility] includes sequencing and rearrangement analysis for the following 20 genes:  ATM, BARD1, BRCA1, BRCA2, BRIP1, CDH1, CHEK2, EPCAM, FANCC, MLH1, MSH2, MSH6, NBN, PALB2, PMS2, PTEN, RAD51C, RAD51D, TP53, and XRCC2.     Based on Jessica Mendoza's personal and family history of cancer, she meets medical criteria for genetic testing. Despite that she meets criteria, she may still have an out of pocket cost. We discussed that if her out of pocket cost for testing is over $100, the laboratory will call and confirm whether she wants to proceed with testing.  If the out of pocket cost of testing is less than $100 she will be  billed by the genetic testing laboratory.   PLAN: After considering the risks, benefits, and limitations, Jessica Mendoza  provided informed consent to pursue genetic testing and the blood sample was sent to [facility] for analysis of the Breast/Ovarian cancer panel. Results should be available within approximately 2-3 weeks' time.  Based on needing results back for surgical decisions, we ordered the test STAT which will hopefully see the results back closer to 2 weeks, at which point they will be disclosed by telephone to Jessica Mendoza, as will any additional recommendations warranted by these results. Jessica Mendoza will receive a summary of her genetic counseling visit and a copy of her results once available. This information will also be available in Epic. We encouraged Jessica Mendoza to remain in contact with cancer genetics annually so that we can continuously update the family history and inform her of any changes in cancer genetics and testing that may be of benefit for her family. Jessica Mendoza. Gangi questions were answered to her satisfaction today. Our contact information was provided should additional questions or concerns arise.  Lastly, we encouraged Jessica Mendoza.  Pain to remain in contact with cancer genetics annually so that we can continuously update the family history and inform her of any changes in cancer genetics and testing that may be of benefit for this family.   Jessica Mendoza.  Worth questions were answered to her satisfaction today. Our contact information was provided should additional questions or concerns arise. Thank you for the referral and allowing [place] to share in the care of your patient.   Kaegan Hettich P. Fl[place]ne [place], [place], [facility] Certified Genetic Counselor [email or web address] phone: [phone]  The patient was seen for a total of 60 minutes in face-to-face genetic counseling.  This patient was discussed with Drs. Magrinat, Lindi Mendoza and/or Burr Medico who agrees with the above.    _______________________________________________________________________ For Office Staff:  Number of people involved in session: 2 Was an Intern/ student involved with case: no

## 2014-09-24 ENCOUNTER — Encounter: Payer: Self-pay | Admitting: General Practice

## 2014-09-24 NOTE — Progress Notes (Signed)
[facility] Psychosocial Distress Screening Spiritual Care  Met with Jessica Mendoza and husband Jessica Mendoza at [facility] to introduce [name] team/resources, reviewing distress screen per protocol.  The patient scored a 5 on the Psychosocial Distress Thermometer which indicates moderate distress. Also assessed for distress and other psychosocial needs.   ONCBCN DISTRESS SCREENING 09/24/2014  Screening Type Initial Screening  Distress experienced in past week (1-10) 5  Emotional problem type Adjusting to illness  Information Concerns Type Lack of info about diagnosis  Referral to support programs Yes  Other Spiritual Care, Alight   Provided pastoral presence and reflective listening as Jessica Mendoza and Jessica Mendoza processed learning of dx.  Jessica Mendoza reports decreased distress after receiving info about dx/tx at [facility].  Jessica Mendoza states that he is "a fixer--and I can't fix this," naming his struggle and apprehension.  He used opportunity well to share and reflect.  They report support from church and verbalized appreciation for the breadth and depth of support programming available at [facility].  Follow up needed: No.  Pt/family have print resources about [place] and are aware of ongoing chaplain/Support Team availability for support.  Referring to [place] per pt permission.  Please also page as needs arise.  Thank you.  [place], [place] Pager [phone] Voicemail  [phone]

## 2014-09-29 ENCOUNTER — Encounter: Payer: Self-pay | Admitting: Genetic Counselor

## 2014-09-29 ENCOUNTER — Telehealth: Payer: Self-pay | Admitting: *Deleted

## 2014-09-29 NOTE — Telephone Encounter (Signed)
Left vm for pt to return call regarding [place] from 09/22/14. Contact information given.

## 2014-09-30 ENCOUNTER — Telehealth: Payer: Self-pay | Admitting: Genetic Counselor

## 2014-09-30 ENCOUNTER — Ambulatory Visit: Payer: Self-pay | Admitting: Genetic Counselor

## 2014-09-30 ENCOUNTER — Encounter: Payer: Self-pay | Admitting: Genetic Counselor

## 2014-09-30 DIAGNOSIS — C50411 Malignant neoplasm of upper-outer quadrant of right female breast: Secondary | ICD-10-CM

## 2014-09-30 DIAGNOSIS — Z1379 Encounter for other screening for genetic and chromosomal anomalies: Secondary | ICD-10-CM

## 2014-09-30 DIAGNOSIS — Z8041 Family history of malignant neoplasm of ovary: Secondary | ICD-10-CM

## 2014-09-30 DIAGNOSIS — Z800 Family history of malignant neoplasm of digestive organs: Secondary | ICD-10-CM

## 2014-09-30 NOTE — Telephone Encounter (Signed)
Revealed negative genetic testing on the Breast/Ovarian cancer panel.  Discussed that it would be nice to see her cousin, Pat's, genetic testing to determine if there is a KFM running in the family and if there is, that Ms. Wesenberg is a true negative.  She will be in contact with her soon.

## 2014-09-30 NOTE — Progress Notes (Signed)
HPI: Jessica Mendoza was previously seen in the [facility] clinic due to a personal and family history of cancer and concerns regarding a hereditary predisposition to cancer. Please refer to our prior cancer genetics clinic note for more information regarding Jessica Mendoza's medical, social and family histories, and our assessment and recommendations, at the time. Jessica Mendoza recent genetic test results were disclosed to her, as were recommendations warranted by these results. These results and recommendations are discussed in more detail below.  FAMILY HISTORY:  We obtained a detailed, 4-generation family history.  Significant diagnoses are listed below: Family History  Problem Relation Age of Onset  . Hypertension Mother   . Heart attack Mother   . Alcohol abuse Father   . Hypertension Brother   . Hyperlipidemia Brother   . Ovarian cancer Maternal Aunt 56  . Pancreatic cancer Maternal Grandfather 8  . Breast cancer Cousin 57    Mother's paternal first cousin, had breast cancer again at 63  . Ovarian cancer Cousin 26    "groin cancer"  . Hypertension Paternal Grandmother   . Diabetes Paternal Grandmother   . Liver cancer Paternal Aunt 34    ETOH related    Patient's maternal ancestors are of African American descent, and paternal ancestors are of African American descent. There is no reported Ashkenazi Jewish ancestry. There is no known consanguinity.  GENETIC TEST RESULTS: At the time of Jessica Mendoza's visit, we recommended she pursue genetic testing of the Breast/Ovarian cancer gene panel. The Breast/Ovarian gene panel offered by [facility] includes sequencing and rearrangement analysis for the following 20 genes:  ATM, BARD1, BRCA1, BRCA2, BRIP1, CDH1, CHEK2, EPCAM, FANCC, MLH1, MSH2, MSH6, NBN, PALB2, PMS2, PTEN, RAD51C, RAD51D, TP53, and XRCC2.   The report date is September 30, 2014.  Genetic testing was normal, and did not reveal a deleterious mutation in these genes. The  test report has been scanned into EPIC under the Molecular Pathology section of the Results Review tab.   We discussed with Jessica Mendoza that since the current genetic testing is not perfect, it is possible there may be a gene mutation in one of these genes that current testing cannot detect, but that chance is small. We also discussed, that it is possible that another gene that has not yet been discovered, or that we have not yet tested, is responsible for the cancer diagnoses in the family, and it is, therefore, important to remain in touch with cancer genetics in the future so that we can continue to offer Jessica Mendoza the most up to date genetic testing.   CANCER SCREENING RECOMMENDATIONS: This result is reassuring and indicates that Jessica Mendoza likely does not have an increased risk for a future cancer due to a mutation in one of these genes. This normal test also suggests that Jessica Mendoza's cancer was most likely not due to an inherited predisposition associated with one of these genes.  Most cancers happen by chance and this negative test suggests that her cancer falls into this category.  We, therefore, recommended she continue to follow the cancer management and screening guidelines provided by her oncology and primary healthcare provider.   RECOMMENDATIONS FOR FAMILY MEMBERS: Women in this family might be at some increased risk of developing cancer, over the general population risk, simply due to the family history of cancer. We recommended women in this family have a yearly mammogram beginning at age 63, or 19 years younger than the earliest onset of cancer, an an  annual clinical breast exam, and perform monthly breast self-exams. Women in this family should also have a gynecological exam as recommended by their primary provider. All family members should have a colonoscopy by age 71.  Based on Jessica Mendoza's family history, we recommended her mother's first cousin, Jessica Mendoza, who was diagnosed  with breast cancer at age 63, have genetic counseling and testing. Jessica Mendoza will let us know if we can be of any assistance in coordinating genetic counseling and/or testing for this family member.   FOLLOW-UP: Lastly, we discussed with Jessica Mendoza that cancer genetics is a rapidly advancing field and it is possible that new genetic tests will be appropriate for her and/or her family members in the future. We encouraged her to remain in contact with cancer genetics on an annual basis so we can update her personal and family histories and let her know of advances in cancer genetics that may benefit this family.   Our contact number was provided. Jessica Mendoza questions were answered to her satisfaction, and she knows she is welcome to call us at anytime with additional questions or concerns.   Jessica Kayser, MS, [facility] Certified Genetic Counselor [email or web address]

## 2014-10-05 ENCOUNTER — Other Ambulatory Visit: Payer: Self-pay | Admitting: General Surgery

## 2014-10-05 DIAGNOSIS — C50911 Malignant neoplasm of unspecified site of right female breast: Secondary | ICD-10-CM

## 2014-10-07 ENCOUNTER — Other Ambulatory Visit: Payer: Self-pay | Admitting: General Surgery

## 2014-10-08 ENCOUNTER — Telehealth: Payer: Self-pay | Admitting: Hematology and Oncology

## 2014-10-08 ENCOUNTER — Other Ambulatory Visit: Payer: Self-pay | Admitting: *Deleted

## 2014-10-08 NOTE — Telephone Encounter (Signed)
Called patient and she is aware of her post op appointment

## 2014-10-14 ENCOUNTER — Encounter (HOSPITAL_COMMUNITY): Payer: Self-pay

## 2014-10-21 ENCOUNTER — Encounter (HOSPITAL_COMMUNITY): Payer: Self-pay

## 2014-10-25 ENCOUNTER — Encounter: Payer: Self-pay | Admitting: Internal Medicine

## 2014-10-25 ENCOUNTER — Ambulatory Visit (INDEPENDENT_AMBULATORY_CARE_PROVIDER_SITE_OTHER): Payer: 59 | Admitting: Internal Medicine

## 2014-10-25 VITALS — BP 130/84 | HR 67 | Ht 65.0 in | Wt 221.6 lb

## 2014-10-25 DIAGNOSIS — I480 Paroxysmal atrial fibrillation: Secondary | ICD-10-CM | POA: Diagnosis not present

## 2014-10-25 NOTE — Patient Instructions (Signed)
Medication Instructions: - no changes  Labwork: - none  Procedures/Testing: - none  Follow-Up: - Your physician wants you to follow-up in: 1 year with Dr. Klein You will receive a reminder letter in the mail two months in advance. If you don't receive a letter, please call our office to schedule the follow-up appointment.  Any Additional Special Instructions Will Be Listed Below (If Applicable).   

## 2014-10-25 NOTE — Progress Notes (Signed)
skf      Patient Care Team: Gaynelle Arabian, MD as PCP - General (Family Medicine) Jacelyn Pi, MD (Endocrinology) Inda Castle, MD (Gastroenterology) Vania Rea, MD (Obstetrics and Gynecology) Excell Seltzer, MD as Consulting Physician (General Surgery) Nicholas Lose, MD as Consulting Physician (Hematology and Oncology) Eppie Gibson, MD as Attending Physician (Radiation Oncology) Mauro Kaufmann, RN as Registered Nurse Rockwell Germany, RN as Registered Nurse Holley Bouche, NP as Nurse Practitioner (Nurse Practitioner) Sylvan Cheese, NP as Nurse Practitioner (Nurse Practitioner)   HPI  Jessica Mendoza is a 59 y.o. female Seen in followup for atrial fibrillation associated with exercise intolerance. She had a CHADS2 score of 2 and a CHADS-VASc score of 3 and is on Rivaroxaban;  Temp assoc with tongue bleeding    She has had no symptomatic atrial fibrillation; she underwent cardioversion 5/14  Interval Myoview was normal  AHI was 17 on sleep study   There is a family history of HCM although her echocardiogram doesn't demonstrate any evidence of LVH.  She is better in sinus rhythm with improved exercise tolerance.    Past Medical History  Diagnosis Date  . Diabetes mellitus, type 2 06/2010  . Essential hypertension, benign   . Other and unspecified hyperlipidemia   . Migraines     menstrual  . Colon polyps 12/2005  . Focal nodular hyperplasia of liver     bx 2002  . Benign positional vertigo   . Goiter 10/2006  . Hypothyroidism   . Arthritis     WRISTS AND NECK  . Glaucoma   . OSA (obstructive sleep apnea)   . Afib   . Obesity   . Family history of hypertrophic cardiomyopathy   . Hot flashes   . Breast cancer 2016    ER+/PR-/Her2-  . Family history of ovarian cancer   . Family history of pancreatic cancer     Past Surgical History  Procedure Laterality Date  . Liver biopsy  2002  . Breast lumpectomy      right breast- benign  .  Cardioversion N/A 07/11/2012    Procedure: CARDIOVERSION;  Surgeon: Larey Dresser, MD;  Location: [place];  Service: Cardiovascular;  Laterality: N/A;    Current Outpatient Prescriptions  Medication Sig Dispense Refill  . amLODipine (NORVASC) 10 MG tablet Take 1 tablet (10 mg total) by mouth daily. NO MORE REFILLS WITHOUT OFFICE VISIT - 2ND NOTICE 15 tablet 0  . Cholecalciferol (VITAMIN D-3) 1000 UNITS CAPS Take 1 capsule by mouth 3 (three) times a week.    . Coenzyme Q10 (CO Q 10) 100 MG CAPS Take by mouth daily.     Marland Kitchen levothyroxine (SYNTHROID, LEVOTHROID) 25 MCG tablet Take 25 mcg by mouth daily. NEED REFILLS    . LIVALO 1 MG TABS Take 1 tablet by mouth every other day.  0  . losartan-hydrochlorothiazide (HYZAAR) 100-12.5 MG per tablet Take 1 tablet by mouth daily. NO MORE REFILLS WITHOUT OFFICE VISIT - 2ND NOTICE 15 tablet 0  . meloxicam (MOBIC) 15 MG tablet TK 1 T PO QD PC  2  . metFORMIN (GLUCOPHAGE-XR) 500 MG 24 hr tablet Take 1 tablet (500 mg total) by mouth daily with breakfast. NO MORE REFILLS WITHOUT OFFICE VISIT - 2ND NOTICE 15 tablet 0  . metoprolol succinate (TOPROL-XL) 100 MG 24 hr tablet Take 1 tablet (100 mg total) by mouth daily. NO MORE REFILLS WITHOUT OFFICE VISIT - 2ND NOTICE 15 tablet 0  . Multiple Vitamins-Minerals (WOMENS  50+ MULTI VITAMIN/MIN) TABS Take by mouth daily.    . rivaroxaban (XARELTO) 20 MG TABS tablet Take 1 tablet (20 mg total) by mouth daily. 30 tablet 6   No current facility-administered medications for this visit.    Allergies  Allergen Reactions  . Simvastatin     Myalgias  ?? lipitor     Review of Systems negative except from HPI and PMH  Physical Exam BP 130/84 mmHg  Pulse 67  Ht _0  (1.651 m)  Wt 221 lb 9.6 oz (100.517 kg)  BMI 36.88 kg/m2 Well developed and well nourished in no acute distress HENT normal E scleral and icterus clear Neck Supple JVP flat; carotids brisk and full Clear to ausculation  *Regular rate and  rhythm, no murmurs gallops or rub Soft with active bowel sounds No clubbing cyanosis none Edema Alert and oriented, grossly normal motor and sensory function Skin Warm and Dry  ECG demonstrates sinus rhythm at 72 intervals 19/07/40  R prime in lead V1  Assessment and  Plan  Hypertension well controlled   Atrial fibrillation-paroxysmal no symptomatic recurrences  We had a lengthy discussion regarding anticoagulation and the risks and benefits. she is currently on Rivaroxaban   Sleep apnea   Obesity As above  Family history of HCM

## 2014-11-18 ENCOUNTER — Telehealth: Payer: Self-pay | Admitting: Internal Medicine

## 2014-11-18 ENCOUNTER — Encounter (HOSPITAL_BASED_OUTPATIENT_CLINIC_OR_DEPARTMENT_OTHER): Payer: Self-pay | Admitting: *Deleted

## 2014-11-18 NOTE — Progress Notes (Signed)
Dr Caryl Comes at [facility] called and cardiac clearance requested for surgery 11/25/14.  Pt seen September 2016.  Office note from visit in [place].

## 2014-11-18 NOTE — Telephone Encounter (Signed)
New message      Request for surgical clearance:  1. What type of surgery is being performed?  Rt breast lumpectory  When is this surgery scheduled? 11-25-14 Are there any medications that need to be held prior to surgery and how long? Medical clearance and hold xarelto---pt was told instructions at last ov--need it in writing 2. Name of physician performing surgery? Dr Excell Seltzer  3. What is your office phone and fax number?  Fax [phone]

## 2014-11-19 ENCOUNTER — Encounter (HOSPITAL_BASED_OUTPATIENT_CLINIC_OR_DEPARTMENT_OTHER)
Admission: RE | Admit: 2014-11-19 | Discharge: 2014-11-19 | Disposition: A | Discharge status: Home / Self Care | Payer: 59 | Source: Ambulatory Visit | Attending: General Surgery | Admitting: General Surgery

## 2014-11-19 DIAGNOSIS — E119 Type 2 diabetes mellitus without complications: Secondary | ICD-10-CM | POA: Diagnosis not present

## 2014-11-19 DIAGNOSIS — D0511 Intraductal carcinoma in situ of right breast: Secondary | ICD-10-CM | POA: Diagnosis not present

## 2014-11-19 DIAGNOSIS — Z803 Family history of malignant neoplasm of breast: Secondary | ICD-10-CM | POA: Diagnosis not present

## 2014-11-19 DIAGNOSIS — Z170 Estrogen receptor positive status [ER+]: Secondary | ICD-10-CM | POA: Diagnosis not present

## 2014-11-19 DIAGNOSIS — Z8041 Family history of malignant neoplasm of ovary: Secondary | ICD-10-CM | POA: Diagnosis not present

## 2014-11-19 DIAGNOSIS — C50911 Malignant neoplasm of unspecified site of right female breast: Secondary | ICD-10-CM | POA: Diagnosis present

## 2014-11-19 LAB — BASIC METABOLIC PANEL
Anion gap: 9 (ref 5–15)
BUN: 9 mg/dL (ref 6–20)
CO2: 29 mmol/L (ref 22–32)
Calcium: 9 mg/dL (ref 8.9–10.3)
Chloride: 100 mmol/L — ABNORMAL LOW (ref 101–111)
Creatinine, Ser: 0.89 mg/dL (ref 0.44–1.00)
GFR calc Af Amer: >60 mL/min (ref >60)
GFR calc non Af Amer: >60 mL/min (ref >60)
Glucose, Bld: 158 mg/dL — ABNORMAL HIGH (ref 65–99)
Potassium: 4.1 mmol/L (ref 3.5–5.1)
Sodium: 138 mmol/L (ref 135–145)

## 2014-11-19 NOTE — Progress Notes (Signed)
Dr. Kalman Shan reviewed cardiology note from 10/25/2014 [facility] for surgery

## 2014-11-20 NOTE — Telephone Encounter (Signed)
It has been months since i have seen her but at taht time there were no contraindications to surgery

## 2014-11-22 ENCOUNTER — Other Ambulatory Visit: Payer: Self-pay | Admitting: Internal Medicine

## 2014-11-22 NOTE — Telephone Encounter (Signed)
How long can she hold Xarelto?

## 2014-11-24 NOTE — H&P (Signed)
History of Present Illness Jessica Kitchen T. Merlyn Conley MD; 09/22/2014 2:28 PM) The patient is a 59 year old female who presents with breast cancer. Patint is a post menopausal female referred by Dr. Marcelo Mendoza for evaluation of recently diagnosed carcinoma of the right breast. She recently presented for a screening mamogram revealing a new abnormal density in the right breast. Subsequent imaging included diagnostic mamogram showing an irregular mass and ultrasound showing a 1.6 cm irregular mass at the 11:00 position in the upper outer right breast 6 cm from the nipple. An ultrasound guided breast biopsy was performed on 008/05/2014 with pathology revealing invasive ductal carcinoma of the breast. She is seen now in [facility] for initial treatment planning. She has experienced no breast symptoms, specifically lump, pain, nipple discharge or skin changes.. She has a history of a previous right breast lumpectomy in May 2013 for a complex sclerosing lesion with calcifications.. She has a history of breast cancer in a maternal cousin at age 26 and a history of ovarian cancer in a maternal aunt.  Findings at that time were the following: Tumor size: 1.6 cm Tumor grade: 2 Estrogen Receptor: positive Progesterone Receptor: negative Her-2 neu: negative Lymph node status: negative    Other Problems Jessica Mendoza, Jessica Mendoza; 09/22/2014 8:27 AM) Arthritis Atrial Fibrillation Breast Cancer Diabetes Mellitus High blood pressure Home Oxygen Use Hypercholesterolemia Lump In Breast Migraine Headache Sleep Apnea Thyroid Disease  Past Surgical History Jessica Mendoza, Jessica Mendoza; 09/22/2014 8:26 AM) Breast Biopsy Right. multiple Colon Polyp Removal - Colonoscopy Liver Surgery Oral Surgery Thyroid Surgery  Diagnostic Studies History Jessica Mendoza, Jessica Mendoza; 09/22/2014 8:26 AM) Colonoscopy 5-10 years ago Pap Smear 1-5 years ago  Social History Jessica Mendoza, [place]; 09/22/2014 8:27 AM) Alcohol use  Occasional alcohol use. No caffeine use No drug use Tobacco use Former smoker.  Family History Jessica Mendoza, [place]; 09/22/2014 8:26 AM) Alcohol Abuse Father. Anesthetic complications Family Members In General. Arthritis Mother. Breast Cancer Family Members In General. Cerebrovascular Accident Family Members In General. Cervical Cancer Family Members In General. Heart Disease Family Members In General, Mother. Heart disease in female family member before age 93 Hypertension Mother. Malignant Neoplasm Of Pancreas Family Members In General. Migraine Headache Mother. Ovarian Cancer Family Members In General. Respiratory Condition Family Members In General. Thyroid problems Mother.  Pregnancy / Birth History Jessica Mendoza, Jessica Mendoza; 09/22/2014 8:27 AM) Age at menarche 42 years. Age of menopause 51-55 Contraceptive History Oral contraceptives. Gravida 3 Irregular periods Maternal age 16-25 Para 1  Review of Systems Jessica Mendoza Jessica Mendoza; 09/22/2014 8:27 AM) General Present- Night Sweats. Not Present- Appetite Loss, Chills, Fatigue, Fever, Weight Gain and Weight Loss. Skin Present- Change in Wart/Mole, Dryness and New Lesions. Not Present- Hives, Jaundice, Non-Healing Wounds, Rash and Ulcer. HEENT Present- Wears glasses/contact lenses. Not Present- Earache, Hearing Loss, Hoarseness, Nose Bleed, Oral Ulcers, Ringing in the Ears, Seasonal Allergies, Sinus Pain, Sore Throat, Visual Disturbances and Yellow Eyes. Respiratory Present- Snoring. Not Present- Bloody sputum, Chronic Cough, Difficulty Breathing and Wheezing. Breast Present- Breast Mass. Not Present- Breast Pain, Nipple Discharge and Skin Changes. Cardiovascular Present- Swelling of Extremities. Not Present- Chest Pain, Difficulty Breathing Lying Down, Leg Cramps, Palpitations, Rapid Heart Rate and Shortness of Breath. Gastrointestinal Present- Bloating. Not Present- Abdominal Pain, Bloody Stool, Change in Bowel  Habits, Chronic diarrhea, Constipation, Difficulty Swallowing, Excessive gas, Gets full quickly at meals, Hemorrhoids, Indigestion, Nausea, Rectal Pain and Vomiting. Female Genitourinary Present- Frequency and Nocturia. Not Present- Painful Urination, Pelvic Pain and Urgency. Musculoskeletal Present- Joint Pain and Joint  Stiffness. Not Present- Back Pain, Muscle Pain, Muscle Weakness and Swelling of Extremities. Neurological Present- Numbness. Not Present- Decreased Memory, Fainting, Headaches, Seizures, Tingling, Tremor, Trouble walking and Weakness. Psychiatric Present- Anxiety. Not Present- Bipolar, Change in Sleep Pattern, Depression, Fearful and Frequent crying. Endocrine Present- Cold Intolerance and Hot flashes. Not Present- Excessive Hunger, Hair Changes, Heat Intolerance and New Diabetes. Hematology Not Present- Easy Bruising, Excessive bleeding, Gland problems, HIV and Persistent Infections.   Physical Exam Jessica Kitchen T. Jessica Wainright MD; 09/22/2014 2:30 PM) The physical exam findings are as follows: Note:General: Alert, moderately obese African-American female, in no distress Skin: Warm and dry without rash or infection. HEENT: No palpable masses or thyromegaly. Sclera nonicteric. Pupils equal round and reactive. Lymph nodes: No cervical, supraclavicular, or inguinal nodes palpable. Breasts: There is a well-healed lumpectomy incision at the 9:00 position of the right breast. Mild bruising at the nearby core biopsy site but no definite palpable mass. No palpable adenopathy in either axilla. No skin changes or nipple inversion or crusting. Lungs: Breath sounds clear and equal. No wheezing or increased work of breathing. Cardiovascular: Regular rate and rhythm without murmer. 1+ ankle edema. Abdomen: Nondistended. Soft and nontender. No masses palpable. No organomegaly. No palpable hernias. Extremities: 1+ edema . No chronic venous stasis changes. Neurologic: Alert and fully oriented. Gait  normal. No focal weakness. Psychiatric: Normal mood and affect. Thought content appropriate with normal judgement and insight    Assessment & Plan Jessica Kitchen T. Lylian Sanagustin MD; 09/22/2014 2:46 PM) PRIMARY CANCER OF UPPER OUTER QUADRANT OF RIGHT BREAST (174.4  C50.411) Impression: 59 year old female with a new diagnosis of cancer of the right breast, upper outer quadrant. Clinical stage 1 a, ER positive, PR negative, HER-2 negative. I discussed with the patient and family members present today initial surgical treatment options. We discussed options of breast conservation with lumpectomy or total mastectomy and sentinal lymph node biopsy/dissection. Options for reconstruction were discussed. I believe she would be a good candidate for breast conservation.. After discussion they have elected to proceed with bbreast conservation with radioactive seed localized lumpectomy and right axillary sentinel lymph node biopsy.. We discussed the indications and nature of the procedure, and expected recovery, in detail. Surgical risks including anesthetic complications, cardiorespiratory complications, bleeding, infection, wound healing complications, blood clots, lymphedema, local and distant recurrence and possible need for further surgery based on the final pathology was discussed and understood. Chemotherapy, hormonal therapy and radiation therapy have been discussed. They have been provided with literature regarding the treatment of breast cancer. All questions were answered. Patient is and we will need to stop several toe 2 days in advancewe will need to stop Xarelto 2 days in advance. she is a candidate for genetic testing which will be pursued. She would consider bilateral mastectomy is gen test resul call her with the resultsbove surgery and I will call her with the resultsbove surgery and I will call her with the results. Current Plans  Referred to Genetic Counseling, for evaluation and follow up (Medical  Genetics). Schedule for Surgery radioactive seed localized right breast lumpectomy and right axillary sentinel lymph node biopsy pending results of genetic testing Pt Education - [facility] Breast Biopsy [facility]

## 2014-11-24 NOTE — Anesthesia Preprocedure Evaluation (Addendum)
Anesthesia Evaluation  Patient identified by MRN, date of birth, ID band Patient awake    Reviewed: Allergy & Precautions, NPO status , Patient's Chart, lab work & pertinent test results, reviewed documented beta blocker date and time   Airway Mallampati: I  TM Distance: >3 FB Neck ROM: Full    Dental  (+) Teeth Intact   Pulmonary sleep apnea , former smoker,    breath sounds clear to auscultation       Cardiovascular hypertension, Pt. on medications and Pt. on home beta blockers + dysrhythmias Atrial Fibrillation  Rhythm:Regular Rate:Normal     Neuro/Psych  Headaches, negative psych ROS   GI/Hepatic negative GI ROS, Neg liver ROS,   Endo/Other  diabetes, Type 2, Oral Hypoglycemic AgentsHypothyroidism   Renal/GU negative Renal ROS     Musculoskeletal  (+) Arthritis ,   Abdominal   Peds negative pediatric ROS (+)  Hematology   Anesthesia Other Findings   Reproductive/Obstetrics                            Lab Results  Component Value Date   WBC 5.5 09/22/2014   HGB 12.8 09/22/2014   HCT 39.1 09/22/2014   MCV 83.9 09/22/2014   PLT 251 09/22/2014   Lab Results  Component Value Date   CREATININE 0.89 11/19/2014   BUN 9 11/19/2014   NA 138 11/19/2014   K 4.1 11/19/2014   CL 100* 11/19/2014   CO2 29 11/19/2014   Lab Results  Component Value Date   INR 1.4* 07/08/2012   INR 0.97 06/03/2012   EKG: normal EKG, normal sinus rhythm.   Anesthesia Physical Anesthesia Plan  ASA: III  Anesthesia Plan: General   Post-op Pain Management: GA combined w/ Regional for post-op pain   Induction: Intravenous  Airway Management Planned: LMA  Additional Equipment:   Intra-op Plan:   Post-operative Plan: Extubation in OR  Informed Consent: I have reviewed the patients History and Physical, chart, labs and discussed the procedure including the risks, benefits and alternatives for the  proposed anesthesia with the patient or authorized representative who has indicated his/her understanding and acceptance.   Dental advisory given  Plan Discussed with: CRNA  Anesthesia Plan Comments:         Anesthesia Quick Evaluation

## 2014-11-24 NOTE — Telephone Encounter (Signed)
As needed for surgery , risks are not zero but very low

## 2014-11-24 NOTE — Telephone Encounter (Signed)
Faxed information to surgical center

## 2014-11-25 ENCOUNTER — Encounter (HOSPITAL_COMMUNITY)
Admission: RE | Admit: 2014-11-25 | Discharge: 2014-11-25 | Disposition: A | Discharge status: Home / Self Care | Payer: 59 | Source: Ambulatory Visit | Attending: General Surgery | Admitting: General Surgery

## 2014-11-25 ENCOUNTER — Encounter (HOSPITAL_BASED_OUTPATIENT_CLINIC_OR_DEPARTMENT_OTHER): Payer: Self-pay

## 2014-11-25 ENCOUNTER — Encounter (HOSPITAL_BASED_OUTPATIENT_CLINIC_OR_DEPARTMENT_OTHER)
Admission: RE | Disposition: A | Discharge status: Home / Self Care | Payer: Self-pay | Source: Ambulatory Visit | Attending: General Surgery

## 2014-11-25 ENCOUNTER — Ambulatory Visit (HOSPITAL_BASED_OUTPATIENT_CLINIC_OR_DEPARTMENT_OTHER): Payer: 59 | Admitting: Anesthesiology

## 2014-11-25 ENCOUNTER — Ambulatory Visit (HOSPITAL_BASED_OUTPATIENT_CLINIC_OR_DEPARTMENT_OTHER)
Admission: RE | Admit: 2014-11-25 | Discharge: 2014-11-25 | Disposition: A | Discharge status: Home / Self Care | Payer: 59 | Source: Ambulatory Visit | Attending: General Surgery | Admitting: General Surgery

## 2014-11-25 DIAGNOSIS — C50911 Malignant neoplasm of unspecified site of right female breast: Secondary | ICD-10-CM

## 2014-11-25 DIAGNOSIS — D0511 Intraductal carcinoma in situ of right breast: Secondary | ICD-10-CM | POA: Diagnosis not present

## 2014-11-25 DIAGNOSIS — Z8041 Family history of malignant neoplasm of ovary: Secondary | ICD-10-CM | POA: Insufficient documentation

## 2014-11-25 DIAGNOSIS — C50411 Malignant neoplasm of upper-outer quadrant of right female breast: Secondary | ICD-10-CM

## 2014-11-25 DIAGNOSIS — E119 Type 2 diabetes mellitus without complications: Secondary | ICD-10-CM | POA: Insufficient documentation

## 2014-11-25 DIAGNOSIS — Z170 Estrogen receptor positive status [ER+]: Secondary | ICD-10-CM | POA: Insufficient documentation

## 2014-11-25 DIAGNOSIS — Z803 Family history of malignant neoplasm of breast: Secondary | ICD-10-CM | POA: Insufficient documentation

## 2014-11-25 HISTORY — PX: BREAST LUMPECTOMY WITH RADIOACTIVE SEED AND SENTINEL LYMPH NODE BIOPSY: SHX6550

## 2014-11-25 LAB — GLUCOSE, CAPILLARY
Glucose-Capillary: 128 mg/dL — ABNORMAL HIGH (ref 65–99)
Glucose-Capillary: 157 mg/dL — ABNORMAL HIGH (ref 65–99)

## 2014-11-25 LAB — POCT HEMOGLOBIN-HEMACUE: Hemoglobin: 12.6 g/dL (ref 12.0–15.0)

## 2014-11-25 SURGERY — BREAST LUMPECTOMY WITH RADIOACTIVE SEED AND SENTINEL LYMPH NODE BIOPSY
Anesthesia: General | Laterality: Right

## 2014-11-25 MED ORDER — LACTATED RINGERS IV SOLN: INTRAVENOUS | Status: DC

## 2014-11-25 MED ORDER — METHYLENE BLUE 1 % INJ SOLN
INTRAMUSCULAR | Status: AC
  Filled 2014-11-25: qty 10

## 2014-11-25 MED ORDER — LIDOCAINE HCL (CARDIAC) 20 MG/ML IV SOLN
INTRAVENOUS | Status: DC | PRN
  Administered 2014-11-25: 75 mg via INTRAVENOUS

## 2014-11-25 MED ORDER — MIDAZOLAM HCL 2 MG/2ML IJ SOLN
1.0000 mg | INTRAMUSCULAR | Status: DC | PRN
  Administered 2014-11-25: 2 mg via INTRAVENOUS

## 2014-11-25 MED ORDER — ONDANSETRON HCL 4 MG/2ML IJ SOLN
INTRAMUSCULAR | Status: DC | PRN
Start: 2014-11-25 — End: 2014-11-25
  Administered 2014-11-25: 4 mg via INTRAVENOUS

## 2014-11-25 MED ORDER — MEPERIDINE HCL 25 MG/ML IJ SOLN: 6.2500 mg | INTRAMUSCULAR | Status: DC | PRN

## 2014-11-25 MED ORDER — CHLORHEXIDINE GLUCONATE 4 % EX LIQD: 1.0000 "application " | Freq: Once | CUTANEOUS | Status: DC

## 2014-11-25 MED ORDER — BUPIVACAINE-EPINEPHRINE (PF) 0.5% -1:200000 IJ SOLN
INTRAMUSCULAR | Status: AC
  Filled 2014-11-25: qty 30

## 2014-11-25 MED ORDER — SCOPOLAMINE 1 MG/3DAYS TD PT72: 1.0000 | MEDICATED_PATCH | Freq: Once | TRANSDERMAL | Status: DC | PRN

## 2014-11-25 MED ORDER — SODIUM CHLORIDE 0.9 % IJ SOLN
INTRAMUSCULAR | Status: AC
  Filled 2014-11-25: qty 10

## 2014-11-25 MED ORDER — TECHNETIUM TC 99M SULFUR COLLOID FILTERED
1.0000 | Freq: Once | INTRAVENOUS | Status: AC | PRN
  Administered 2014-11-25: 1 via INTRADERMAL

## 2014-11-25 MED ORDER — MIDAZOLAM HCL 2 MG/2ML IJ SOLN
INTRAMUSCULAR | Status: AC
  Filled 2014-11-25: qty 2

## 2014-11-25 MED ORDER — SUCCINYLCHOLINE CHLORIDE 20 MG/ML IJ SOLN
INTRAMUSCULAR | Status: AC
  Filled 2014-11-25: qty 1

## 2014-11-25 MED ORDER — PROMETHAZINE HCL 25 MG/ML IJ SOLN: 6.2500 mg | INTRAMUSCULAR | Status: DC | PRN

## 2014-11-25 MED ORDER — EPHEDRINE SULFATE 50 MG/ML IJ SOLN
INTRAMUSCULAR | Status: DC | PRN
  Administered 2014-11-25 (×2): 10 mg via INTRAVENOUS

## 2014-11-25 MED ORDER — DEXAMETHASONE SODIUM PHOSPHATE 4 MG/ML IJ SOLN
INTRAMUSCULAR | Status: DC | PRN
  Administered 2014-11-25: 10 mg via INTRAVENOUS

## 2014-11-25 MED ORDER — BUPIVACAINE-EPINEPHRINE (PF) 0.5% -1:200000 IJ SOLN
INTRAMUSCULAR | Status: DC | PRN
  Administered 2014-11-25: 10 mL via PERINEURAL

## 2014-11-25 MED ORDER — HYDROMORPHONE HCL 1 MG/ML IJ SOLN: 0.2500 mg | INTRAMUSCULAR | Status: DC | PRN

## 2014-11-25 MED ORDER — HYDROCODONE-ACETAMINOPHEN 5-325 MG PO TABS
1.0000 | ORAL_TABLET | Freq: Once | ORAL | Status: AC
  Administered 2014-11-25: 1 via ORAL

## 2014-11-25 MED ORDER — CEFAZOLIN SODIUM-DEXTROSE 2-3 GM-% IV SOLR
INTRAVENOUS | Status: AC
  Filled 2014-11-25: qty 50

## 2014-11-25 MED ORDER — BUPIVACAINE HCL (PF) 0.25 % IJ SOLN
INTRAMUSCULAR | Status: AC
  Filled 2014-11-25: qty 120

## 2014-11-25 MED ORDER — PROPOFOL 10 MG/ML IV BOLUS
INTRAVENOUS | Status: DC | PRN
  Administered 2014-11-25: 150 mg via INTRAVENOUS

## 2014-11-25 MED ORDER — HYDROCODONE-ACETAMINOPHEN 5-325 MG PO TABS: 1.0000 | ORAL_TABLET | ORAL | Status: DC | PRN

## 2014-11-25 MED ORDER — MIDAZOLAM HCL 2 MG/2ML IJ SOLN
INTRAMUSCULAR | Status: AC
  Filled 2014-11-25: qty 4

## 2014-11-25 MED ORDER — HEPARIN SOD (PORK) LOCK FLUSH 100 UNIT/ML IV SOLN
INTRAVENOUS | Status: AC
  Filled 2014-11-25: qty 10

## 2014-11-25 MED ORDER — FENTANYL CITRATE (PF) 100 MCG/2ML IJ SOLN
INTRAMUSCULAR | Status: AC
  Filled 2014-11-25: qty 4

## 2014-11-25 MED ORDER — DEXAMETHASONE SODIUM PHOSPHATE 10 MG/ML IJ SOLN
INTRAMUSCULAR | Status: AC
  Filled 2014-11-25: qty 1

## 2014-11-25 MED ORDER — GLYCOPYRROLATE 0.2 MG/ML IJ SOLN: 0.2000 mg | Freq: Once | INTRAMUSCULAR | Status: DC | PRN

## 2014-11-25 MED ORDER — ONDANSETRON HCL 4 MG/2ML IJ SOLN
INTRAMUSCULAR | Status: AC
  Filled 2014-11-25: qty 2

## 2014-11-25 MED ORDER — PROPOFOL 500 MG/50ML IV EMUL
INTRAVENOUS | Status: AC
  Filled 2014-11-25: qty 50

## 2014-11-25 MED ORDER — LACTATED RINGERS IV SOLN
INTRAVENOUS | Status: DC
  Administered 2014-11-25 (×2): via INTRAVENOUS

## 2014-11-25 MED ORDER — LIDOCAINE HCL (CARDIAC) 20 MG/ML IV SOLN
INTRAVENOUS | Status: AC
  Filled 2014-11-25: qty 5

## 2014-11-25 MED ORDER — FENTANYL CITRATE (PF) 100 MCG/2ML IJ SOLN
50.0000 ug | INTRAMUSCULAR | Status: AC | PRN
  Administered 2014-11-25: 25 ug via INTRAVENOUS
  Administered 2014-11-25: 50 ug via INTRAVENOUS
  Administered 2014-11-25: 25 ug via INTRAVENOUS
  Administered 2014-11-25: 50 ug via INTRAVENOUS

## 2014-11-25 MED ORDER — HYDROCODONE-ACETAMINOPHEN 5-325 MG PO TABS
ORAL_TABLET | ORAL | Status: AC
  Filled 2014-11-25: qty 1

## 2014-11-25 MED ORDER — BUPIVACAINE-EPINEPHRINE (PF) 0.5% -1:200000 IJ SOLN
INTRAMUSCULAR | Status: DC | PRN
  Administered 2014-11-25: 25 mL

## 2014-11-25 MED ORDER — CEFAZOLIN SODIUM-DEXTROSE 2-3 GM-% IV SOLR
2.0000 g | INTRAVENOUS | Status: AC
  Administered 2014-11-25: 2 g via INTRAVENOUS

## 2014-11-25 MED ORDER — SODIUM CHLORIDE 0.9 % IJ SOLN
INTRAMUSCULAR | Status: DC | PRN
  Administered 2014-11-25: 5 mL

## 2014-11-25 MED ORDER — FENTANYL CITRATE (PF) 100 MCG/2ML IJ SOLN
INTRAMUSCULAR | Status: AC
  Filled 2014-11-25: qty 2

## 2014-11-25 MED ORDER — EPHEDRINE SULFATE 50 MG/ML IJ SOLN
INTRAMUSCULAR | Status: AC
  Filled 2014-11-25: qty 1

## 2014-11-25 SURGICAL SUPPLY — 51 items
APPLIER CLIP 9.375 MED OPEN (MISCELLANEOUS) ×2
APR CLP MED 9.3 20 MLT OPN (MISCELLANEOUS) ×1
BINDER BREAST LRG (GAUZE/BANDAGES/DRESSINGS) IMPLANT
BINDER BREAST MEDIUM (GAUZE/BANDAGES/DRESSINGS) IMPLANT
BINDER BREAST XLRG (GAUZE/BANDAGES/DRESSINGS) ×2 IMPLANT
BINDER BREAST XXLRG (GAUZE/BANDAGES/DRESSINGS) IMPLANT
BLADE SURG 15 STRL LF DISP TIS (BLADE) ×1 IMPLANT
BLADE SURG 15 STRL SS (BLADE) ×2
CANISTER SUC SOCK COL 7IN (MISCELLANEOUS) IMPLANT
CANISTER SUCT 1200ML W/VALVE (MISCELLANEOUS) IMPLANT
CHLORAPREP W/TINT 26ML (MISCELLANEOUS) ×2 IMPLANT
CLIP APPLIE 9.375 MED OPEN (MISCELLANEOUS) ×1 IMPLANT
COVER BACK TABLE 60X90IN (DRAPES) ×2 IMPLANT
COVER MAYO STAND STRL (DRAPES) ×2 IMPLANT
COVER PROBE W GEL 5X96 (DRAPES) ×2 IMPLANT
DECANTER SPIKE VIAL GLASS SM (MISCELLANEOUS) IMPLANT
DEVICE DUBIN W/COMP PLATE 8390 (MISCELLANEOUS) ×2 IMPLANT
DRAPE LAPAROSCOPIC ABDOMINAL (DRAPES) ×2 IMPLANT
DRAPE UTILITY XL STRL (DRAPES) ×2 IMPLANT
ELECT COATED BLADE 2.86 ST (ELECTRODE) ×2 IMPLANT
ELECT REM PT RETURN 9FT ADLT (ELECTROSURGICAL) ×2
ELECTRODE REM PT RTRN 9FT ADLT (ELECTROSURGICAL) ×1 IMPLANT
GLOVE BIO SURGEON STRL SZ 6.5 (GLOVE) ×2 IMPLANT
GLOVE BIOGEL PI IND STRL 7.0 (GLOVE) ×1 IMPLANT
GLOVE BIOGEL PI IND STRL 8 (GLOVE) ×1 IMPLANT
GLOVE BIOGEL PI INDICATOR 7.0 (GLOVE) ×1
GLOVE BIOGEL PI INDICATOR 8 (GLOVE) ×1
GLOVE ECLIPSE 7.5 STRL STRAW (GLOVE) ×2 IMPLANT
GLOVE EXAM NITRILE LRG STRL (GLOVE) ×2 IMPLANT
GOWN STRL REUS W/ TWL LRG LVL3 (GOWN DISPOSABLE) ×1 IMPLANT
GOWN STRL REUS W/ TWL XL LVL3 (GOWN DISPOSABLE) ×1 IMPLANT
GOWN STRL REUS W/TWL LRG LVL3 (GOWN DISPOSABLE) ×2
GOWN STRL REUS W/TWL XL LVL3 (GOWN DISPOSABLE) ×2
ILLUMINATOR WAVEGUIDE N/F (MISCELLANEOUS) ×2 IMPLANT
KIT MARKER MARGIN INK (KITS) ×2 IMPLANT
LIQUID BAND (GAUZE/BANDAGES/DRESSINGS) ×2 IMPLANT
NDL SAFETY ECLIPSE 18X1.5 (NEEDLE) ×1 IMPLANT
NEEDLE HYPO 18GX1.5 SHARP (NEEDLE) ×1
NEEDLE HYPO 25X1 1.5 SAFETY (NEEDLE) ×4 IMPLANT
NS IRRIG 1000ML POUR BTL (IV SOLUTION) ×2 IMPLANT
PACK BASIN DAY SURGERY FS (CUSTOM PROCEDURE TRAY) ×2 IMPLANT
PENCIL BUTTON HOLSTER BLD 10FT (ELECTRODE) ×2 IMPLANT
SLEEVE SCD COMPRESS KNEE MED (MISCELLANEOUS) ×2 IMPLANT
SPONGE LAP 4X18 X RAY DECT (DISPOSABLE) ×2 IMPLANT
SUT MON AB 5-0 PS2 18 (SUTURE) ×2 IMPLANT
SUT VICRYL 3-0 CR8 SH (SUTURE) ×2 IMPLANT
SYR CONTROL 10ML LL (SYRINGE) ×4 IMPLANT
TOWEL OR 17X24 6PK STRL BLUE (TOWEL DISPOSABLE) ×2 IMPLANT
TOWEL OR NON WOVEN STRL DISP B (DISPOSABLE) ×2 IMPLANT
TUBE CONNECTING 20X1/4 (TUBING) IMPLANT
YANKAUER SUCT BULB TIP NO VENT (SUCTIONS) IMPLANT

## 2014-11-25 NOTE — Anesthesia Postprocedure Evaluation (Addendum)
  Anesthesia Post-op Note  Patient: Jessica Mendoza  Procedure(s) Performed: Procedure(s): RIGHT BREAST LUMPECTOMY WITH RADIOACTIVE SEED AND RIGHT AXILLARY SENTINEL LYMPH NODE BIOPSY (Right)  Patient Location: PACU  Anesthesia Type:GA combined with regional for post-op pain  Level of Consciousness: awake, alert  and oriented  Airway and Oxygen Therapy: Patient Spontanous Breathing  Post-op Pain: minimal  Post-op Assessment: Post-op Vital signs reviewed and Patient's Cardiovascular Status Stable              Post-op Vital Signs: Reviewed and stable  Last Vitals:  Filed Vitals:   11/25/14 0945  BP: 120/65  Pulse: 72  Temp:   Resp: 17    Complications: No apparent anesthesia complications

## 2014-11-25 NOTE — Op Note (Signed)
Preoperative Diagnosis: cancer right breast  Postoprative Diagnosis: cancer right breast  Procedure: Procedure(s): BLUE DYE INJECTION RIGHT BREAST, RIGHT BREAST LUMPECTOMY WITH RADIOACTIVE SEED AND RIGHT AXILLARY SENTINEL LYMPH NODE BIOPSY   Surgeon: Excell Seltzer T   Assistants: None  Anesthesia:  General LMA anesthesia  Indications: Patient is a 59 year old female who underwent a right breast cancer discovered on recent screening mammogram. Biopsy has confirmed a stage I, 1.6 cm ER/PR positive invasive ductal carcinoma in the upper outer right breast. After extensive discussion regarding treatment and risks detailed elsewhere we elected to proceed with right breast lumpectomy and axillary sentinel lymph node biopsy as initial surgical therapy.  Procedure Detail:  The patient had previously undergone accurate placement of a radioactive seed at the site of the tumor and biopsy clip. The placement of the seed was confirmed with the neoprobe in the holding area. She underwent injection of 1 mCi of technetium sulfur colloid intradermally around the right nipple in the holding area. Anesthesia performed a pectoral block. She was then taken to the operating room, placed in the supine position on the operating table, and laryngeal mask general anesthesia induced. Under sterile technique after patient timeout I injected 5 mL of dilute methylene blue subcutaneously beneath the right nipple and massaged this for several minutes. Following this the entire right chest and breast, axilla, and upper arm were widely sterilely prepped and draped. She received preoperative IV antibiotics. PAS were in place. The neoprobe was used to localize the seed which was very lateral the upper outer quadrant near the tail of Spence. I elected to use a single incision for the lumpectomy and sentinel node. An incision was made in the low axilla in the skin crease curvilinear and dissection carried down through the  subcutaneous tissue. Using the [employer] lighted retractor  A several centimeter skin and subcutaneous flap was raised medially over the tumor which was localized with the neoprobe. There was also a small palpable mass. Using palpation in the neoprobe for guidance I then excised a generous specimen of breast tissue around the mass and area of high counts. This was right at the edge of the pectoralis major and the posterior margin was fascia. The excision measured about 5-1/2 cm and was spherical. The specimen was inked for margins and specimen mammogram showed the mass and clip and seed centrally located within the specimen. This was sent for permanent pathology. Through the same incision the axilla could be easily exposed and a high area of counts identified. The clavipectoral fascia was incised and using careful blunt dissection I dissected down onto a slightly enlarged node with elevated counts which was completely excised with cautery. It however had minimally elevated counts with much higher counts just posterior to this. The first node was sent as hot axillary sentinel lymph node. Posteriorly I dissected down onto a slightly enlarged but soft node with very high counts and bright blue dye. It was completely excised and ex vivo had counts of 1400. This was sent as hot blue axillary sentinel lymph node #2. I cannot feel any other axillary adenopathy with careful palpation and background in the axilla at this point was less than 40. Hemostasis was assured. The clavipectoral fascia was closed with interrupted 3-0 Vicryl. I mobilized the breast tissue for a short distance off of the pectoralis major to rotate into the defect. The lumpectomy cavity was marked with clips. The breast tissue was closed with interrupted 3-0 Vicryl, subcutaneous tissue with interrupted 3-0 Vicryl and skin  with running subcuticular 5-0 Monocryl and Dermabond. Sponge needle and instrument counts were correct.    Findings: As  above  Estimated Blood Loss:  Minimal         Drains: none  Blood Given: none          Specimens: #1 right breast lumpectomy    #2 hot right axillary sentinel lymph node    #3 hot and blue right axillary sentinel lymph node        Complications:  * No complications entered in OR log *         Disposition: PACU - hemodynamically stable.         Condition: stable

## 2014-11-25 NOTE — H&P (Signed)
History of Present Illness  The patient is a 59 year old female who presents with breast cancer. Patint is a post menopausal female referred by Dr. Marcelo Baldy for evaluation of recently diagnosed carcinoma of the right breast. She recently presented for a screening mamogram revealing a new abnormal density in the right breast. Subsequent imaging included diagnostic mamogram showing an irregular mass and ultrasound showing a 1.6 cm irregular mass at the 11:00 position in the upper outer right breast 6 cm from the nipple. An ultrasound guided breast biopsy was performed on 008/05/2014 with pathology revealing invasive ductal carcinoma of the breast. She is seen now in [facility] for initial treatment planning. She has experienced no breast symptoms, specifically lump, pain, nipple discharge or skin changes.. She has a history of a previous right breast lumpectomy in May 2013 for a complex sclerosing lesion with calcifications.. She has a history of breast cancer in a maternal cousin at age 62 and a history of ovarian cancer in a maternal aunt.  Findings at that time were the following: Tumor size: 1.6 cm Tumor grade: 2 Estrogen Receptor: positive Progesterone Receptor: negative Her-2 neu: negative Lymph node status: negative    Other Problems Patsey Berthold, CMA; 09/22/2014 8:27 AM) Arthritis Atrial Fibrillation Breast Cancer Diabetes Mellitus High blood pressure Home Oxygen Use Hypercholesterolemia Lump In Breast Migraine Headache Sleep Apnea Thyroid Disease  Past Surgical History Patsey Berthold, [place]; 09/22/2014 8:26 AM) Breast Biopsy Right. multiple Colon Polyp Removal - Colonoscopy Liver Surgery Oral Surgery Thyroid Surgery  Diagnostic Studies History Patsey Berthold, [place]; 09/22/2014 8:26 AM) Colonoscopy 5-10 years ago Pap Smear 1-5 years ago  Social History Patsey Berthold, [place]; 09/22/2014 8:27 AM) Alcohol use Occasional alcohol use. No caffeine use No drug  use Tobacco use Former smoker.  Family History Patsey Berthold, [place]; 09/22/2014 8:26 AM) Alcohol Abuse Father. Anesthetic complications Family Members In General. Arthritis Mother. Breast Cancer Family Members In General. Cerebrovascular Accident Family Members In General. Cervical Cancer Family Members In General. Heart Disease Family Members In General, Mother. Heart disease in female family member before age 2 Hypertension Mother. Malignant Neoplasm Of Pancreas Family Members In General. Migraine Headache Mother. Ovarian Cancer Family Members In General. Respiratory Condition Family Members In General. Thyroid problems Mother.  Pregnancy / Birth History Patsey Berthold, [place]; 09/22/2014 8:27 AM) Age at menarche 80 years. Age of menopause 51-55 Contraceptive History Oral contraceptives. Gravida 3 Irregular periods Maternal age 17-25 Para 1  Review of Systems Patsey Berthold CMA; 09/22/2014 8:27 AM) General Present- Night Sweats. Not Present- Appetite Loss, Chills, Fatigue, Fever, Weight Gain and Weight Loss. Skin Present- Change in Wart/Mole, Dryness and New Lesions. Not Present- Hives, Jaundice, Non-Healing Wounds, Rash and Ulcer. HEENT Present- Wears glasses/contact lenses. Not Present- Earache, Hearing Loss, Hoarseness, Nose Bleed, Oral Ulcers, Ringing in the Ears, Seasonal Allergies, Sinus Pain, Sore Throat, Visual Disturbances and Yellow Eyes. Respiratory Present- Snoring. Not Present- Bloody sputum, Chronic Cough, Difficulty Breathing and Wheezing. Breast Present- Breast Mass. Not Present- Breast Pain, Nipple Discharge and Skin Changes. Cardiovascular Present- Swelling of Extremities. Not Present- Chest Pain, Difficulty Breathing Lying Down, Leg Cramps, Palpitations, Rapid Heart Rate and Shortness of Breath. Gastrointestinal Present- Bloating. Not Present- Abdominal Pain, Bloody Stool, Change in Bowel Habits, Chronic diarrhea, Constipation, Difficulty  Swallowing, Excessive gas, Gets full quickly at meals, Hemorrhoids, Indigestion, Nausea, Rectal Pain and Vomiting. Female Genitourinary Present- Frequency and Nocturia. Not Present- Painful Urination, Pelvic Pain and Urgency. Musculoskeletal Present- Joint Pain and Joint Stiffness. Not Present- Back Pain, Muscle  Pain, Muscle Weakness and Swelling of Extremities. Neurological Present- Numbness. Not Present- Decreased Memory, Fainting, Headaches, Seizures, Tingling, Tremor, Trouble walking and Weakness. Psychiatric Present- Anxiety. Not Present- Bipolar, Change in Sleep Pattern, Depression, Fearful and Frequent crying. Endocrine Present- Cold Intolerance and Hot flashes. Not Present- Excessive Hunger, Hair Changes, Heat Intolerance and New Diabetes. Hematology Not Present- Easy Bruising, Excessive bleeding, Gland problems, HIV and Persistent Infections.   Physical Exam Marland Kitchen T. Elnoria Livingston MD; 09/22/2014 2:30 PM) The physical exam findings are as follows: Note:General: Alert, moderately obese African-American female, in no distress Skin: Warm and dry without rash or infection. HEENT: No palpable masses or thyromegaly. Sclera nonicteric. Pupils equal round and reactive. Lymph nodes: No cervical, supraclavicular, or inguinal nodes palpable. Breasts: There is a well-healed lumpectomy incision at the 9:00 position of the right breast. Mild bruising at the nearby core biopsy site but no definite palpable mass. No palpable adenopathy in either axilla. No skin changes or nipple inversion or crusting. Lungs: Breath sounds clear and equal. No wheezing or increased work of breathing. Cardiovascular: Regular rate and rhythm without murmer. 1+ ankle edema. Abdomen: Nondistended. Soft and nontender. No masses palpable. No organomegaly. No palpable hernias. Extremities: 1+ edema . No chronic venous stasis changes. Neurologic: Alert and fully oriented. Gait normal. No focal weakness. Psychiatric: Normal mood  and affect. Thought content appropriate with normal judgement and insight    Assessment & Plan Marland Kitchen T. Brytni Dray MD; 09/22/2014 2:46 PM) PRIMARY CANCER OF UPPER OUTER QUADRANT OF RIGHT BREAST (174.4  C50.411) Impression: 59 year old female with a new diagnosis of cancer of the right breast, upper outer quadrant. Clinical stage 1 a, ER positive, PR negative, HER-2 negative. I discussed with the patient and family members present today initial surgical treatment options. We discussed options of breast conservation with lumpectomy or total mastectomy and sentinal lymph node biopsy/dissection. Options for reconstruction were discussed. I believe she would be a good candidate for breast conservation.. After discussion they have elected to proceed with bbreast conservation with radioactive seed localized lumpectomy and right axillary sentinel lymph node biopsy.. We discussed the indications and nature of the procedure, and expected recovery, in detail. Surgical risks including anesthetic complications, cardiorespiratory complications, bleeding, infection, wound healing complications, blood clots, lymphedema, local and distant recurrence and possible need for further surgery based on the final pathology was discussed and understood. Chemotherapy, hormonal therapy and radiation therapy have been discussed. They have been provided with literature regarding the treatment of breast cancer. All questions were answered. Patient is and we will need to stop several toe 2 days in advancewe will need to stop Xarelto 2 days in advance. she is a candidate for genetic testing which will be pursued. She would consider bilateral mastectomy is gen test resul call her with the resultsbove surgery and I will call her with the resultsbove surgery and I will call her with the results. Current Plans  Genetics was negative Schedule for Surgery radioactive seed localized right breast lumpectomy and right axillary sentinel lymph  node biopsy pending results of genetic testing

## 2014-11-25 NOTE — Transfer of Care (Signed)
Immediate Anesthesia Transfer of Care Note  Patient: Jessica Mendoza  Procedure(s) Performed: Procedure(s): RIGHT BREAST LUMPECTOMY WITH RADIOACTIVE SEED AND RIGHT AXILLARY SENTINEL LYMPH NODE BIOPSY (Right)  Patient Location: PACU  Anesthesia Type:General  Level of Consciousness: sedated  Airway & Oxygen Therapy: Patient Spontanous Breathing and Patient connected to face mask oxygen  Post-op Assessment: Report given to RN and Post -op Vital signs reviewed and stable  Post vital signs: Reviewed and stable  Last Vitals:  Filed Vitals:   11/25/14 0730  BP: 112/67  Pulse: 71  Temp:   Resp: 17    Complications: No apparent anesthesia complications

## 2014-11-25 NOTE — Anesthesia Procedure Notes (Addendum)
Anesthesia Regional Block:  Pectoralis block  Pre-Anesthetic Checklist: ,, timeout performed, Correct Patient, Correct Site, Correct Laterality, Correct Procedure, Correct Position, site marked, Risks and benefits discussed,  Surgical consent,  Pre-op evaluation,  At surgeon's request and post-op pain management  Laterality: Right  Prep: chloraprep       Needles:  Injection technique: Single-shot  Needle Type: Stimiplex     Needle Length: 5cm 5 cm Needle Gauge: 22 and 22 G    Additional Needles:  Procedures: ultrasound guided (picture in chart) Pectoralis block  Nerve Stimulator or Paresthesia:  Response: biceps flexion,   Additional Responses:   Narrative:  Start time: 11/25/2014 7:10 AM End time: 11/25/2014 7:15 AM Injection made incrementally with aspirations every 5 mL.  Performed by: Personally  Anesthesiologist: Suella Broad D  Additional Notes: Functioning IV was confirmed and monitors were applied.  A 49mm 22ga Stimuplex needle was used. Sterile prep and drape,hand hygiene and sterile gloves were used.  Negative aspiration and negative test dose prior to incremental administration of local anesthetic. The patient tolerated the procedure well.      Procedure Name: LMA Insertion Date/Time: 11/25/2014 7:47 AM Performed by: Melynda Ripple D Pre-anesthesia Checklist: Patient identified, Emergency Drugs available, Suction available and Patient being monitored Patient Re-evaluated:Patient Re-evaluated prior to inductionOxygen Delivery Method: Circle System Utilized Preoxygenation: Pre-oxygenation with 100% oxygen Intubation Type: IV induction Ventilation: Mask ventilation without difficulty LMA: LMA inserted LMA Size: 4.0 Number of attempts: 1 Airway Equipment and Method: Bite block Placement Confirmation: positive ETCO2 Tube secured with: Tape Dental Injury: Teeth and Oropharynx as per pre-operative assessment

## 2014-11-25 NOTE — Discharge Instructions (Signed)
[facility] °Office Phone Number [phone] ° °BREAST BIOPSY/ PARTIAL MASTECTOMY: POST OP INSTRUCTIONS ° °Always review your discharge instruction sheet given to you by the facility where your surgery was performed. ° °IF YOU HAVE DISABILITY OR FAMILY LEAVE FORMS, YOU MUST BRING THEM TO THE OFFICE FOR PROCESSING.  DO NOT GIVE THEM TO YOUR DOCTOR. ° °1. A prescription for pain medication may be given to you upon discharge.  Take your pain medication as prescribed, if needed.  If narcotic pain medicine is not needed, then you may take acetaminophen (Tylenol) or ibuprofen (Advil) as needed. °2. Take your usually prescribed medications unless otherwise directed °3. If you need a refill on your pain medication, please contact your pharmacy.  They will contact our office to request authorization.  Prescriptions will not be filled after 5pm or on week-ends. °4. You should eat very light the first 24 hours after surgery, such as soup, crackers, pudding, etc.  Resume your normal diet the day after surgery. °5. Most patients will experience some swelling and bruising in the breast.  Ice packs and a good support bra will help.  Swelling and bruising can take several days to resolve.  °6. It is common to experience some constipation if taking pain medication after surgery.  Increasing fluid intake and taking a stool softener will usually help or prevent this problem from occurring.  A mild laxative (Milk of Magnesia or Miralax) should be taken according to package directions if there are no bowel movements after 48 hours. °7. Unless discharge instructions indicate otherwise, you may remove your bandages 24-48 hours after surgery, and you may shower at that time.  You may have steri-strips (small skin tapes) in place directly over the incision.  These strips should be left on the skin for 7-10 days.  If your surgeon used skin glue on the incision, you may shower in 24 hours.  The glue will flake off over the  next 2-3 weeks.  Any sutures or staples will be removed at the office during your follow-up visit. °8. ACTIVITIES:  You may resume regular daily activities (gradually increasing) beginning the next day.  Wearing a good support bra or sports bra minimizes pain and swelling.  You may have sexual intercourse when it is comfortable. °a. You may drive when you no longer are taking prescription pain medication, you can comfortably wear a seatbelt, and you can safely maneuver your car and apply brakes. °b. RETURN TO WORK:  ______________________________________________________________________________________ °9. You should see your doctor in the office for a follow-up appointment approximately two weeks after your surgery.  Your doctor’s nurse will typically make your follow-up appointment when she calls you with your pathology report.  Expect your pathology report 2-3 business days after your surgery.  You may call to check if you do not hear from us after three days. °10. OTHER INSTRUCTIONS: _______________________________________________________________________________________________ _____________________________________________________________________________________________________________________________________ °_____________________________________________________________________________________________________________________________________ °_____________________________________________________________________________________________________________________________________ ° °WHEN TO CALL YOUR DOCTOR: °1. Fever over 101.0 °2. Nausea and/or vomiting. °3. Extreme swelling or bruising. °4. Continued bleeding from incision. °5. Increased pain, redness, or drainage from the incision. ° °The clinic staff is available to answer your questions during regular business hours.  Please don’t hesitate to call and ask to speak to one of the nurses for clinical concerns.  If you have a medical emergency, go to the nearest  emergency room or call 911.  A surgeon from [facility] is always on call at the hospital. ° °For further questions, please visit [email or web address]  ° ° ° °  Post Anesthesia Home Care Instructions ° °Activity: °Get plenty of rest for the remainder of the day. A responsible adult should stay with you for 24 hours following the procedure.  °For the next 24 hours, DO NOT: °-Drive a car °-Operate machinery °-Drink alcoholic beverages °-Take any medication unless instructed by your physician °-Make any legal decisions or sign important papers. ° °Meals: °Start with liquid foods such as gelatin or soup. Progress to regular foods as tolerated. Avoid greasy, spicy, heavy foods. If nausea and/or vomiting occur, drink only clear liquids until the nausea and/or vomiting subsides. Call your physician if vomiting continues. ° °Special Instructions/Symptoms: °Your throat may feel dry or sore from the anesthesia or the breathing tube placed in your throat during surgery. If this causes discomfort, gargle with warm salt water. The discomfort should disappear within 24 hours. ° °If you had a scopolamine patch placed behind your ear for the management of post- operative nausea and/or vomiting: ° °1. The medication in the patch is effective for 72 hours, after which it should be removed.  Wrap patch in a tissue and discard in the trash. Wash hands thoroughly with soap and water. °2. You may remove the patch earlier than 72 hours if you experience unpleasant side effects which may include dry mouth, dizziness or visual disturbances. °3. Avoid touching the patch. Wash your hands with soap and water after contact with the patch. °  ° °

## 2014-11-25 NOTE — Progress Notes (Signed)
Assisted Dr. Smith Robert with right, ultrasound guided, pectoralis block and assisted nuc med inj. Side rails up, monitors on throughout procedure. See vital signs in flow sheet. Tolerated Procedure well.

## 2014-11-26 ENCOUNTER — Encounter (HOSPITAL_BASED_OUTPATIENT_CLINIC_OR_DEPARTMENT_OTHER): Payer: Self-pay | Admitting: General Surgery

## 2014-12-05 NOTE — Assessment & Plan Note (Signed)
Rt  Lumpectomy 11/25/14: IDC 1.7 cm Grade 2 with DCIS , 0/2 LN, ER 100, PR 0%, Her 2 Neg T1CN0 (Stage 1A)  Recommendations: 1. Oncotype DX testing to determine if chemotherapy would be of any benefit followed by 2. Adjuvant radiation therapy followed by 3. Adjuvant antiestrogen therapy  RTC based on Oncotype Dx

## 2014-12-06 ENCOUNTER — Encounter: Payer: Self-pay | Admitting: Hematology and Oncology

## 2014-12-06 ENCOUNTER — Telehealth: Payer: Self-pay | Admitting: Hematology and Oncology

## 2014-12-06 ENCOUNTER — Encounter: Payer: Self-pay | Admitting: *Deleted

## 2014-12-06 ENCOUNTER — Ambulatory Visit (HOSPITAL_BASED_OUTPATIENT_CLINIC_OR_DEPARTMENT_OTHER): Payer: 59 | Admitting: Hematology and Oncology

## 2014-12-06 VITALS — BP 126/64 | HR 70 | Temp 97.4°F | Resp 17 | Ht 65.0 in | Wt 214.7 lb

## 2014-12-06 DIAGNOSIS — C50411 Malignant neoplasm of upper-outer quadrant of right female breast: Secondary | ICD-10-CM | POA: Diagnosis not present

## 2014-12-06 DIAGNOSIS — Z170 Estrogen receptor positive status [ER+]: Secondary | ICD-10-CM

## 2014-12-06 NOTE — Progress Notes (Signed)
Patient Care Team: Gaynelle Arabian, MD as PCP - General (Family Medicine) Jacelyn Pi, MD (Endocrinology) Inda Castle, MD (Gastroenterology) Vania Rea, MD (Obstetrics and Gynecology) Excell Seltzer, MD as Consulting Physician (General Surgery) Nicholas Lose, MD as Consulting Physician (Hematology and Oncology) Eppie Gibson, MD as Attending Physician (Radiation Oncology) Mauro Kaufmann, RN as Registered Nurse Rockwell Germany, RN as Registered Nurse Holley Bouche, NP as Nurse Practitioner (Nurse Practitioner) Sylvan Cheese, NP as Nurse Practitioner (Nurse Practitioner)  DIAGNOSIS: Breast cancer of upper-outer quadrant of right female breast [facility])   Staging form: Breast, AJCC 7th Edition     Clinical stage from 09/22/2014: Stage IA (T1c, N0, M0) - Unsigned   SUMMARY OF ONCOLOGIC HISTORY:   Breast cancer of upper-outer quadrant of right female breast ([place])   09/16/2014 Initial Diagnosis Right breast biopsy: Invasive ductal carcinoma, grade 2, ER 100%, PR 0%, HER-2 negative, Ki-67 10%; by ultrasound measures 1.6 cm T1 cN0 M0 stage IA clinical stage   11/25/2014 Surgery Rt  Lumpectomy: 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 She is recovering very well from surgery. She does not take any more pain medication.  REVIEW OF SYSTEMS:   Constitutional: Denies fevers, chills or abnormal weight loss Eyes: Denies blurriness of vision Ears, nose, mouth, throat, and face: Denies mucositis or sore throat Respiratory: Denies cough, dyspnea or wheezes Cardiovascular: Denies palpitation, chest discomfort or lower extremity swelling Gastrointestinal:  Denies nausea, heartburn or change in bowel habits Skin: Denies  abnormal skin rashes Lymphatics: Denies new lymphadenopathy or easy bruising Neurological:Denies numbness, tingling or new weaknesses Behavioral/Psych: Mood is stable, no new changes  Breast: Mild discomfort from recent surgery All other systems were reviewed with the patient and are negative.  I have reviewed the past medical history, past surgical history, social history and family history with the patient and they are unchanged from previous note.  ALLERGIES:  is allergic to simvastatin.  MEDICATIONS:  Current Outpatient Prescriptions  Medication Sig Dispense Refill  . amLODipine (NORVASC) 10 MG tablet Take 1 tablet (10 mg total) by mouth daily. NO MORE REFILLS WITHOUT OFFICE VISIT - 2ND NOTICE 15 tablet 0  . Cholecalciferol (VITAMIN D-3) 1000 UNITS CAPS Take 1 capsule by mouth 3 (three) times a week.    . Coenzyme Q10 (CO Q 10) 100 MG CAPS Take by mouth daily.     Marland Kitchen HYDROcodone-acetaminophen (NORCO/VICODIN) 5-325 MG tablet Take 1-2 tablets by mouth every 4 (four) hours as needed for moderate pain or severe pain. 40 tablet 0  . levothyroxine (SYNTHROID, LEVOTHROID) 25 MCG tablet Take 25 mcg by mouth daily. NEED REFILLS    . LIVALO 1 MG TABS Take 1 tablet by mouth every other day.  0  . losartan-hydrochlorothiazide (HYZAAR) 100-12.5 MG per tablet Take 1 tablet by mouth daily. NO MORE REFILLS WITHOUT OFFICE VISIT - 2ND NOTICE 15 tablet 0  . metFORMIN (GLUCOPHAGE-XR) 500 MG 24 hr tablet Take 1 tablet (500 mg total) by mouth daily with breakfast. NO MORE REFILLS WITHOUT OFFICE VISIT - 2ND NOTICE 15 tablet 0  . metoprolol succinate (TOPROL-XL) 100 MG 24 hr tablet Take 1 tablet (100 mg total) by mouth daily. NO MORE REFILLS WITHOUT OFFICE  VISIT - 2ND NOTICE 15 tablet 0  . Multiple Vitamins-Minerals (WOMENS 50+ MULTI VITAMIN/MIN) TABS Take by mouth daily.    . XARELTO 20 MG TABS tablet TAKE 1 TABLET (20 MG TOTAL) BY MOUTH DAILY. 30 tablet 6   No current facility-administered medications for  this visit.    PHYSICAL EXAMINATION: ECOG PERFORMANCE STATUS: 1 - Symptomatic but completely ambulatory  Filed Vitals:   12/06/14 1402  BP: 126/64  Pulse: 70  Temp: 97.4 F (36.3 C)  Resp: 17   Filed Weights   12/06/14 1402  Weight: 214 lb 11.2 oz (97.387 kg)    GENERAL:alert, no distress and comfortable SKIN: skin color, texture, turgor are normal, no rashes or significant lesions EYES: normal, Conjunctiva are pink and non-injected, sclera clear OROPHARYNX:no exudate, no erythema and lips, buccal mucosa, and tongue normal  NECK: supple, thyroid normal size, non-tender, without nodularity LYMPH:  no palpable lymphadenopathy in the cervical, axillary or inguinal LUNGS: clear to auscultation and percussion with normal breathing effort HEART: regular rate & rhythm and no murmurs and no lower extremity edema ABDOMEN:abdomen soft, non-tender and normal bowel sounds Musculoskeletal:no cyanosis of digits and no clubbing  NEURO: alert & oriented x 3 with fluent speech, no focal motor/sensory deficits  LABORATORY DATA:  I have reviewed the data as listed   Chemistry      Component Value Date/Time   NA 138 11/19/2014 0920   NA 140 09/22/2014 1248   K 4.1 11/19/2014 0920   K 3.6 09/22/2014 1248   CL 100* 11/19/2014 0920   CO2 29 11/19/2014 0920   CO2 29 09/22/2014 1248   BUN 9 11/19/2014 0920   BUN 11.9 09/22/2014 1248   CREATININE 0.89 11/19/2014 0920   CREATININE 1.1 09/22/2014 1248   CREATININE 0.79 04/07/2013 1058   GLU 127 10/10/2010      Component Value Date/Time   CALCIUM 9.0 11/19/2014 0920   CALCIUM 9.3 09/22/2014 1248   ALKPHOS 87 09/22/2014 1248   ALKPHOS 74 04/07/2013 1058   AST 37* 09/22/2014 1248   AST 23 04/07/2013 1058   ALT 61* 09/22/2014 1248   ALT 38* 04/07/2013 1058   BILITOT 0.64 09/22/2014 1248   BILITOT 0.8 04/07/2013 1058       Lab Results  Component Value Date   WBC 5.5 09/22/2014   HGB 12.6 11/25/2014   HCT 39.1 09/22/2014   MCV  83.9 09/22/2014   PLT 251 09/22/2014   NEUTROABS 2.7 09/22/2014   ASSESSMENT & PLAN:  Breast cancer of upper-outer quadrant of right female breast Rt  Lumpectomy 11/25/14: IDC 1.7 cm Grade 2 with DCIS , 0/2 LN, ER 100, PR 0%, Her 2 Neg T1CN0 (Stage 1A)  Recommendations: 1. Oncotype DX testing to determine if chemotherapy would be of any benefit followed by 2. Adjuvant radiation therapy followed by 3. Adjuvant antiestrogen therapy  RTC based on Oncotype Dx Patient has plans to spend Christmas to new year in [place]. She was hoping to finish radiation prior to that.  No orders of the defined types were placed in this encounter.   The patient has a good understanding of the overall plan. she agrees with it. she will call with any problems that may develop before the next visit here.   Gudena, Vinay K, MD 12/06/2014      

## 2014-12-06 NOTE — Telephone Encounter (Signed)
Appointments made and avs printed for pateint °

## 2014-12-06 NOTE — Progress Notes (Signed)
Ordered oncotype per Dr. Gudena.  Faxed requisition to pathology and confirmed receipt. 

## 2014-12-14 ENCOUNTER — Encounter (HOSPITAL_COMMUNITY): Payer: Self-pay

## 2014-12-14 ENCOUNTER — Other Ambulatory Visit: Payer: Self-pay | Admitting: Hematology and Oncology

## 2014-12-14 DIAGNOSIS — C50411 Malignant neoplasm of upper-outer quadrant of right female breast: Secondary | ICD-10-CM

## 2014-12-15 NOTE — Progress Notes (Signed)
Location of Breast Cancer: Right Breast  Histology per Pathology Report:  11/25/14 Diagnosis 1. Breast, lumpectomy, Right - INVASIVE GRADE II DUCTAL CARCINOMA WITH ASSOCIATED CALCIFICATIONS, SPANNING 1.7 CM IN GREATEST DIMENSION. - ASSOCIATED INTERMEDIATE GRADE DUCTAL CARCINOMA IN SITU. - MARGINS ARE NEGATIVE. - SEE ONCOLOGY TEMPLATE. 2. Lymph node, sentinel, biopsy, Right axillary #1 - ONE BENIGN LYMPH NODE WITH NO TUMOR SEEN (0/1). 3. Lymph node, sentinel, biopsy, Right axillary #2 - ONE BENIGN LYMPH NODE WITH NO TUMOR SEEN (0/1). 1 of  Receptor Status: ER(100% POS), PR (0% NEG), Her2-neu (NEG)  Did patient present with symptoms or was this found on screening mammography?: It was found on screening mammogram  Past/Anticipated interventions by surgeon, if any: BLUE DYE INJECTION RIGHT BREAST, RIGHT BREAST LUMPECTOMY WITH RADIOACTIVE SEED AND RIGHT AXILLARY SENTINEL LYMPH NODE BIOPSY   Past/Anticipated interventions by medical oncology, if any: She saw Dr. Lindi Adie on 10/24. Her recommendations were:1. Oncotype DX testing to determine if chemotherapy would be of any benefit followed by 2. Adjuvant radiation therapy followed by 3. Adjuvant antiestrogen therapy  Lymphedema issues, if any:  None  Pain issues, if any: Level 3/10 Tightness and heaviness  SAFETY ISSUES:  Prior radiation? No  Pacemaker/ICD?NO   Possible current pregnancy? No  Is the patient on methotrexate? No  Current Complaints / other details:      Malmfelt, Stephani Police, RN 12/15/2014,3:20 PM

## 2014-12-16 ENCOUNTER — Telehealth: Payer: Self-pay | Admitting: Hematology and Oncology

## 2014-12-16 NOTE — Telephone Encounter (Signed)
Patient called in and left a message that she needs to cancel and not r/s her 11.4 as she has talked with dr Lindi Adie over the phone

## 2014-12-17 ENCOUNTER — Ambulatory Visit
Admission: RE | Admit: 2014-12-17 | Discharge: 2014-12-17 | Disposition: A | Discharge status: Home / Self Care | Payer: 59 | Source: Ambulatory Visit | Attending: Radiation Oncology | Admitting: Radiation Oncology

## 2014-12-17 ENCOUNTER — Encounter: Payer: Self-pay | Admitting: Radiation Oncology

## 2014-12-17 ENCOUNTER — Ambulatory Visit: Payer: 59 | Admitting: Hematology and Oncology

## 2014-12-17 VITALS — BP 110/55 | HR 75 | Temp 98.3°F | Ht 65.0 in | Wt 219.3 lb

## 2014-12-17 DIAGNOSIS — C50411 Malignant neoplasm of upper-outer quadrant of right female breast: Secondary | ICD-10-CM

## 2014-12-17 DIAGNOSIS — Z170 Estrogen receptor positive status [ER+]: Secondary | ICD-10-CM | POA: Diagnosis not present

## 2014-12-17 DIAGNOSIS — Z510 Encounter for antineoplastic radiation therapy: Secondary | ICD-10-CM | POA: Insufficient documentation

## 2014-12-17 NOTE — Addendum Note (Signed)
Encounter addended by: Ernst Spell, RN on: 12/17/2014 10:09 AM<BR>     Documentation filed: Charges VN

## 2014-12-17 NOTE — Progress Notes (Signed)
Radiation Oncology         [phone] ________________________________  Name: Jessica Mendoza MRN: [id number]  Date: 12/17/2014  DOB: Jul 03, 1955  Follow-Up Visit Note  Outpatient  CC: Jessica Huh, MD  Jessica Seltzer, MD  Diagnosis:      ICD-9-CM ICD-10-CM   1. Breast cancer of upper-outer quadrant of right female breast ([place]) 174.4 C50.411     Clinical Stage 1 T1N0M0 Right Breast Cancer, pT1cN0M0 Invasive Ductal Carcinoma with DCIS, Grade II, ER 100%, PR 0%, HER2 negative  Narrative:  The patient returns today for follow-up.     Since consultation, she underwent right lumpectomy and sentinel lymph node biopsy on 11/25/14, by Dr. Excell Mendoza. Pathology is notable for that described in the diagnosis. Her margins are negative by at least 2 mm in situ and invasive disease. She underwent oncotype testing and her score is 18, low risk. No plans for chemotherapy. She reports tightness/pain and heaviness in the surgical site.  No drainage from scar.The patient presents to me today for my consideration of radiotherapy for the management of her disease.  She Still has decreased stamina but plans to return to work next week. Not sure she can work her full load of hours, yet.           ALLERGIES:  is allergic to simvastatin.  Meds: Current Outpatient Prescriptions  Medication Sig Dispense Refill  . amLODipine (NORVASC) 10 MG tablet Take 1 tablet (10 mg total) by mouth daily. NO MORE REFILLS WITHOUT OFFICE VISIT - 2ND NOTICE 15 tablet 0  . Cholecalciferol (VITAMIN D-3) 1000 UNITS CAPS Take 1 capsule by mouth 3 (three) times a week.    . Coenzyme Q10 (CO Q 10) 100 MG CAPS Take by mouth daily.     Marland Kitchen levothyroxine (SYNTHROID, LEVOTHROID) 25 MCG tablet Take 25 mcg by mouth daily. NEED REFILLS    . LIVALO 1 MG TABS Take 1 tablet by mouth every other day.  0  . losartan-hydrochlorothiazide (HYZAAR) 100-12.5 MG per tablet Take 1 tablet by mouth daily. NO MORE REFILLS WITHOUT OFFICE VISIT -  2ND NOTICE 15 tablet 0  . metFORMIN (GLUCOPHAGE-XR) 500 MG 24 hr tablet Take 1 tablet (500 mg total) by mouth daily with breakfast. NO MORE REFILLS WITHOUT OFFICE VISIT - 2ND NOTICE 15 tablet 0  . metoprolol succinate (TOPROL-XL) 100 MG 24 hr tablet Take 1 tablet (100 mg total) by mouth daily. NO MORE REFILLS WITHOUT OFFICE VISIT - 2ND NOTICE 15 tablet 0  . Multiple Vitamins-Minerals (WOMENS 50+ MULTI VITAMIN/MIN) TABS Take by mouth daily.    . potassium chloride (K-DUR) 10 MEQ tablet Take 10 mEq by mouth daily.    Alveda Reasons 20 MG TABS tablet TAKE 1 TABLET (20 MG TOTAL) BY MOUTH DAILY. 30 tablet 6   No current facility-administered medications for this encounter.    Physical Findings:  height is _0  (1.651 m) and weight is 219 lb 4.8 oz (99.474 kg). Her temperature is 98.3 F (36.8 C). Her blood pressure is 110/55 and her pulse is 75. Marland Kitchen     General: Alert and oriented, in no acute distress HEENT: Head is normocephalic.   Neck: Neck is supple, no palpable cervical or supraclavicular lymphadenopathy. Extremities: No cyanosis or edema. Good right shoulder ROM Lymphatics: see Neck Exam Neurologic: No obvious focalities. Speech is fluent.  Psychiatric: Judgment and insight are intact. Affect is appropriate. Breast exam reveals the upper outer quadrant scar of the right breast has healed well without drainage.  There is a modest seroma.  Lab Findings: Lab Results  Component Value Date   WBC 5.5 09/22/2014   HGB 12.6 11/25/2014   HCT 39.1 09/22/2014   MCV 83.9 09/22/2014   PLT 251 09/22/2014       Radiographic Findings: Nm Sentinel Node Inj-no Rpt (breast)  11/25/2014  CLINICAL DATA: Cancer right breast Sulfur colloid was injected intradermally by the nuclear medicine technologist for breast cancer sentinel node localization.    Impression/Plan: Right breast cancer We discussed adjuvant radiotherapy today.  I recommend radiation to the right breast in order to reduce her risk of  locoregional recurrence by two-thirds.  The risks, benefits and side effects of this treatment were discussed in detail.  She understands that radiotherapy is associated with skin irritation and fatigue in the acute setting. Late effects can include cosmetic changes and rare injury to internal organs.   She is enthusiastic about proceeding with treatment. A consent form has been signed and placed in her chart.  I think she can return to work at 50% of her normal load during radiotherapy. I expect she can work full time by 02-13-15.  Paperwork filled out per her request to [employer].  I spent 25 minutes face to face with the patient and more than 50% of that time was spent in counseling and/or coordination of care.  This document serves as a record of services personally performed by Eppie Gibson, MD. It was created on her behalf by Darcus Austin, a trained medical scribe. The creation of this record is based on the scribe's personal observations and the provider's statements to them. This document has been checked and approved by the attending provider.     _____________________________________   Eppie Gibson, MD

## 2014-12-17 NOTE — Progress Notes (Signed)
  Radiation Oncology         [phone] ________________________________  Name: Jessica Mendoza MRN: [id number]  Date: 12/17/2014  DOB: 28-May-1955  SIMULATION AND TREATMENT PLANNING NOTE    Outpatient  DIAGNOSIS:     ICD-9-CM ICD-10-CM   1. Breast cancer of upper-outer quadrant of right female breast ([place]) 174.4 C50.411     NARRATIVE:  The patient was brought to the [facility].  Identity was confirmed.  All relevant records and images related to the planned course of therapy were reviewed.  The patient freely provided informed written consent to proceed with treatment after reviewing the details related to the planned course of therapy. The consent form was witnessed and verified by the simulation staff.    Then, the patient was set-up in a stable reproducible supine position for radiation therapy with her ipsilateral arm over her head, and her upper body secured in a custom-made Vac-lok device.  CT images were obtained.  Surface markings were placed.  The CT images were loaded into the planning software.    TREATMENT PLANNING NOTE: Treatment planning then occurred.  The radiation prescription was entered and confirmed.     A total of 3 medically necessary complex treatment devices were fabricated and supervised by me: 2 fields with MLCs for custom blocks to protect heart, and lungs;  and, a Vac-lok. MORE COMPLEX DEVICES MAY BE MADE IN DOSIMETRY FOR FIELD IN FIELD BEAMS FOR DOSE HOMOGENEITY.  I have requested : 3D Simulation  I have requested a DVH of the following structures: lungs, heart, lumpectomy cavity.    The patient will receive 50 Gy in 25 fractions to the Right breast with 2 tangential fields.   This will not be followed by a boost. We will start her treatments in an expedited manner, due to voiced concerns by patient and family about making a Christmas trip out of town at the end of December.  Optical Surface Tracking Plan:  Since intensity modulated  radiotherapy (IMRT) and 3D conformal radiation treatment methods are predicated on accurate and precise positioning for treatment, intrafraction motion monitoring is medically necessary to ensure accurate and safe treatment delivery. The ability to quantify intrafraction motion without excessive ionizing radiation dose can only be performed with optical surface tracking. Accordingly, surface imaging offers the opportunity to obtain 3D measurements of patient position throughout IMRT and 3D treatments without excessive radiation exposure. I am ordering optical surface tracking for this patient's upcoming course of radiotherapy.  ________________________________   Reference:  Ursula Alert, J, et al. Surface imaging-based analysis of intrafraction motion for breast radiotherapy patients.Journal of [place], n. 6, nov. 2014. ISSN [id number].  Available at: <http://www.jacmp.org/index.php/jacmp/article/view/4957>.    -----------------------------------  Eppie Gibson, MD

## 2014-12-17 NOTE — Progress Notes (Signed)
Paperwork received back from Dr Isidore Moos, faxed to [facility] @ [phone], confirmation received 12/17/14 Ardeen Fillers)

## 2014-12-22 DIAGNOSIS — Z510 Encounter for antineoplastic radiation therapy: Secondary | ICD-10-CM | POA: Diagnosis not present

## 2014-12-23 ENCOUNTER — Ambulatory Visit: Payer: 59 | Admitting: Radiation Oncology

## 2014-12-23 DIAGNOSIS — Z510 Encounter for antineoplastic radiation therapy: Secondary | ICD-10-CM | POA: Diagnosis not present

## 2014-12-24 ENCOUNTER — Ambulatory Visit: Payer: 59

## 2014-12-24 DIAGNOSIS — Z510 Encounter for antineoplastic radiation therapy: Secondary | ICD-10-CM | POA: Diagnosis not present

## 2014-12-27 ENCOUNTER — Ambulatory Visit
Admission: RE | Admit: 2014-12-27 | Discharge: 2014-12-27 | Disposition: A | Discharge status: Home / Self Care | Payer: 59 | Source: Ambulatory Visit | Attending: Radiation Oncology | Admitting: Radiation Oncology

## 2014-12-27 ENCOUNTER — Ambulatory Visit: Admission: RE | Admit: 2014-12-27 | Payer: 59 | Source: Ambulatory Visit

## 2014-12-27 ENCOUNTER — Encounter: Payer: Self-pay | Admitting: Radiation Oncology

## 2014-12-27 VITALS — BP 117/71 | HR 69 | Temp 98.5°F | Ht 65.0 in | Wt 216.2 lb

## 2014-12-27 DIAGNOSIS — C50411 Malignant neoplasm of upper-outer quadrant of right female breast: Secondary | ICD-10-CM | POA: Diagnosis present

## 2014-12-27 DIAGNOSIS — Z510 Encounter for antineoplastic radiation therapy: Secondary | ICD-10-CM | POA: Diagnosis not present

## 2014-12-27 MED ORDER — ALRA NON-METALLIC DEODORANT (RAD-ONC)
1.0000 | Freq: Once | TOPICAL | Status: AC
Start: 2014-12-27 — End: 2014-12-27
  Administered 2014-12-27: 1 via TOPICAL

## 2014-12-27 MED ORDER — RADIAPLEXRX EX GEL
Freq: Once | CUTANEOUS | Status: AC
  Administered 2014-12-27: 18:00:00 via TOPICAL

## 2014-12-27 NOTE — Progress Notes (Signed)
   Weekly Management Note:  Outpatient    ICD-9-CM ICD-10-CM   1. Breast cancer of upper-outer quadrant of right female breast ([facility]) 174.4 C50.411 hyaluronate sodium (RADIAPLEXRX) gel     non-metallic deodorant (ALRA) 1 application    Current Dose:  6 Gy  Projected Dose: 50 Gy   Narrative:  The patient presents for routine under treatment assessment.  CBCT/MVCT images/Port film x-rays were reviewed.  The chart was checked. No new issues  Physical Findings:  height is 5\' 5"  (1.651 m) and weight is 216 lb 3.2 oz (98.068 kg). Her temperature is 98.5 F (36.9 C). Her blood pressure is 117/71 and her pulse is 69.   Wt Readings from Last 3 Encounters:  12/27/14 216 lb 3.2 oz (98.068 kg)  12/17/14 219 lb 4.8 oz (99.474 kg)  12/06/14 214 lb 11.2 oz (97.387 kg)   No skin changes  Impression:  The patient is tolerating radiotherapy.  Plan:  Continue radiotherapy as planned.    ________________________________   Eppie Gibson, M.D.

## 2014-12-27 NOTE — Progress Notes (Signed)
Jessica Mendoza is here for her 3rd fraction of radiation to her Right Breast. She denies pain except for an occasional shooting pain in her Right Breast. She denies any fatigue at this time. Her skin is intact to her Right Breast, however her nipple is slightly darker.   BP 117/71 mmHg  Pulse 69  Temp(Src) 98.5 F (36.9 C)  Ht 5\' 5"  (1.651 m)  Wt 216 lb 3.2 oz (98.068 kg)  BMI 35.98 kg/m2

## 2014-12-28 ENCOUNTER — Ambulatory Visit
Admission: RE | Admit: 2014-12-28 | Discharge: 2014-12-28 | Disposition: A | Discharge status: Home / Self Care | Payer: 59 | Source: Ambulatory Visit | Attending: Radiation Oncology | Admitting: Radiation Oncology

## 2014-12-28 DIAGNOSIS — Z510 Encounter for antineoplastic radiation therapy: Secondary | ICD-10-CM | POA: Diagnosis not present

## 2014-12-28 NOTE — Progress Notes (Signed)
Pt here for patient teaching.  Pt given Radiation and You booklet, skin care instructions, Alra deodorant and Radiaplex gel. Pt reports they have not watched the Radiation Therapy Education video and she was given the link to the website.  Reviewed areas of pertinence such as fatigue, skin changes, breast tenderness, breast swelling, cough, shortness of breath, earaches and taste changes . Pt able to give teach back of to pat skin, use unscented/gentle soap and drink plenty of water,apply Radiaplex bid, avoid applying anything to skin within 4 hours of treatment, avoid wearing an under wire bra and to use an electric razor if they must shave. Pt verbalizes understanding of information given and will contact nursing with any questions or concerns.

## 2014-12-29 ENCOUNTER — Ambulatory Visit: Payer: 59

## 2014-12-29 DIAGNOSIS — Z510 Encounter for antineoplastic radiation therapy: Secondary | ICD-10-CM | POA: Diagnosis not present

## 2014-12-30 ENCOUNTER — Telehealth: Payer: Self-pay | Admitting: *Deleted

## 2014-12-30 ENCOUNTER — Ambulatory Visit: Payer: 59

## 2014-12-30 ENCOUNTER — Encounter: Payer: Self-pay | Admitting: *Deleted

## 2014-12-30 DIAGNOSIS — Z510 Encounter for antineoplastic radiation therapy: Secondary | ICD-10-CM | POA: Diagnosis not present

## 2014-12-30 NOTE — Telephone Encounter (Signed)
Spoke with patient to follow up after start of radiation.  She states she is doing great.  Encouraged her to call with any needs or concerns.

## 2014-12-31 ENCOUNTER — Ambulatory Visit: Payer: 59

## 2014-12-31 DIAGNOSIS — Z510 Encounter for antineoplastic radiation therapy: Secondary | ICD-10-CM | POA: Diagnosis not present

## 2015-01-03 ENCOUNTER — Ambulatory Visit
Admission: RE | Admit: 2015-01-03 | Discharge: 2015-01-03 | Disposition: A | Discharge status: Home / Self Care | Payer: 59 | Source: Ambulatory Visit | Attending: Radiation Oncology | Admitting: Radiation Oncology

## 2015-01-03 ENCOUNTER — Encounter: Payer: Self-pay | Admitting: Radiation Oncology

## 2015-01-03 VITALS — BP 125/71 | HR 71 | Temp 98.5°F | Ht 65.0 in | Wt 218.5 lb

## 2015-01-03 DIAGNOSIS — Z510 Encounter for antineoplastic radiation therapy: Secondary | ICD-10-CM | POA: Diagnosis not present

## 2015-01-03 DIAGNOSIS — C50411 Malignant neoplasm of upper-outer quadrant of right female breast: Secondary | ICD-10-CM

## 2015-01-03 NOTE — Progress Notes (Signed)
Jessica Mendoza has completed 8 fractions to her right breast.  She denies pain.  She reports fatigue.  The skin on her right breast has hyperpigmentation.  She is using radiaplex gel.  BP 125/71 mmHg  Pulse 71  Temp(Src) 98.5 F (36.9 C) (Oral)  Ht 5\' 5"  (1.651 m)  Wt 218 lb 8 oz (99.111 kg)  BMI 36.36 kg/m2

## 2015-01-03 NOTE — Progress Notes (Signed)
   Weekly Management Note:  Outpatient    ICD-9-CM ICD-10-CM   1. Breast cancer of upper-outer quadrant of right female breast (HCC) 174.4 C50.411     Current Dose:  16 Gy  Projected Dose: 50 Gy   Narrative:  The patient presents for routine under treatment assessment.  CBCT/MVCT images/Port film x-rays were reviewed.  The chart was checked. No new issues. Still some firmness/ soreness in right UOQ of breast  Physical Findings:  height is 5\' 5"  (1.651 m) and weight is 218 lb 8 oz (99.111 kg). Her oral temperature is 98.5 F (36.9 C). Her blood pressure is 125/71 and her pulse is 71.   Wt Readings from Last 3 Encounters:  01/03/15 218 lb 8 oz (99.111 kg)  12/27/14 216 lb 3.2 oz (98.068 kg)  12/17/14 219 lb 4.8 oz (99.474 kg)   [place] on right is hyperpigmented, palpable firmness in R UOQ c/w post op changes   Impression:  The patient is tolerating radiotherapy.  Plan:  Continue radiotherapy as planned.    ________________________________   Eppie Gibson, M.D.

## 2015-01-04 ENCOUNTER — Ambulatory Visit
Admission: RE | Admit: 2015-01-04 | Discharge: 2015-01-04 | Disposition: A | Discharge status: Home / Self Care | Payer: 59 | Source: Ambulatory Visit | Attending: Radiation Oncology | Admitting: Radiation Oncology

## 2015-01-04 DIAGNOSIS — Z510 Encounter for antineoplastic radiation therapy: Secondary | ICD-10-CM | POA: Diagnosis not present

## 2015-01-05 ENCOUNTER — Ambulatory Visit
Admission: RE | Admit: 2015-01-05 | Discharge: 2015-01-05 | Disposition: A | Discharge status: Home / Self Care | Payer: 59 | Source: Ambulatory Visit | Attending: Radiation Oncology | Admitting: Radiation Oncology

## 2015-01-05 DIAGNOSIS — Z510 Encounter for antineoplastic radiation therapy: Secondary | ICD-10-CM | POA: Diagnosis not present

## 2015-01-10 ENCOUNTER — Encounter: Payer: Self-pay | Admitting: Radiation Oncology

## 2015-01-10 ENCOUNTER — Ambulatory Visit
Admission: RE | Admit: 2015-01-10 | Discharge: 2015-01-10 | Disposition: A | Discharge status: Home / Self Care | Payer: 59 | Source: Ambulatory Visit | Attending: Radiation Oncology | Admitting: Radiation Oncology

## 2015-01-10 VITALS — BP 113/67 | HR 77 | Temp 97.9°F | Ht 65.0 in | Wt 217.7 lb

## 2015-01-10 DIAGNOSIS — Z510 Encounter for antineoplastic radiation therapy: Secondary | ICD-10-CM | POA: Diagnosis not present

## 2015-01-10 DIAGNOSIS — C50411 Malignant neoplasm of upper-outer quadrant of right female breast: Secondary | ICD-10-CM

## 2015-01-10 NOTE — Progress Notes (Signed)
   Weekly Management Note:  Outpatient    ICD-9-CM ICD-10-CM   1. Breast cancer of upper-outer quadrant of right female breast (HCC) 174.4 C50.411     Current Dose:  22 Gy  Projected Dose: 50 Gy   Narrative:  The patient presents for routine under treatment assessment.  CBCT/MVCT images/Port film x-rays were reviewed.  The chart was checked.  She reports some fatigue, but feels a little more energy after the Thanksgiving Break. She reports her appetite is good, and she is trying to eat healthier.  Her Right Breast is slightly darker in color, with mild tenderness that is improving slowly after her surgery. She is using the radiaplex cream as directed.    Physical Findings:  height is 5\' 5"  (1.651 m) and weight is 217 lb 11.2 oz (98.748 kg). Her temperature is 97.9 F (36.6 C). Her blood pressure is 113/67 and her pulse is 77.   Wt Readings from Last 3 Encounters:  01/10/15 217 lb 11.2 oz (98.748 kg)  01/03/15 218 lb 8 oz (99.111 kg)  12/27/14 216 lb 3.2 oz (98.068 kg)   Right breast is modestly hyperpigmented   Impression:  The patient is tolerating radiotherapy.  Plan:  Continue radiotherapy as planned.    ________________________________   Eppie Gibson, M.D.

## 2015-01-10 NOTE — Progress Notes (Signed)
Jessica Mendoza is here for her 11th fraction of radiation to her Right Breast. She reports some fatigue, but feels a little more energy after the Thanksgiving Break. She reports her appetite is good, and she is trying to eat healthier.  Her Right Breast is slightly darker in color, with mild tenderness that is improving slowly after her surgery. She is using the radiaplex cream as directed.   BP 113/67 mmHg  Pulse 77  Temp(Src) 97.9 F (36.6 C)  Ht 5\' 5"  (1.651 m)  Wt 217 lb 11.2 oz (98.748 kg)  BMI 36.23 kg/m2

## 2015-01-11 ENCOUNTER — Ambulatory Visit
Admission: RE | Admit: 2015-01-11 | Discharge: 2015-01-11 | Disposition: A | Discharge status: Home / Self Care | Payer: 59 | Source: Ambulatory Visit | Attending: Radiation Oncology | Admitting: Radiation Oncology

## 2015-01-11 DIAGNOSIS — Z510 Encounter for antineoplastic radiation therapy: Secondary | ICD-10-CM | POA: Diagnosis not present

## 2015-01-12 ENCOUNTER — Ambulatory Visit
Admission: RE | Admit: 2015-01-12 | Discharge: 2015-01-12 | Disposition: A | Discharge status: Home / Self Care | Payer: 59 | Source: Ambulatory Visit | Attending: Radiation Oncology | Admitting: Radiation Oncology

## 2015-01-12 ENCOUNTER — Telehealth: Payer: Self-pay

## 2015-01-12 DIAGNOSIS — Z510 Encounter for antineoplastic radiation therapy: Secondary | ICD-10-CM | POA: Diagnosis not present

## 2015-01-12 NOTE — Telephone Encounter (Signed)
I have faxed a letter regarding work release for Jessica Mendoza per her request to the two numbers she provided. I called and left a message regarding that I have done this to let her know, and asked her to call if she had any questions.

## 2015-01-13 ENCOUNTER — Ambulatory Visit
Admission: RE | Admit: 2015-01-13 | Discharge: 2015-01-13 | Disposition: A | Discharge status: Home / Self Care | Payer: 59 | Source: Ambulatory Visit | Attending: Radiation Oncology | Admitting: Radiation Oncology

## 2015-01-13 DIAGNOSIS — Z510 Encounter for antineoplastic radiation therapy: Secondary | ICD-10-CM | POA: Diagnosis not present

## 2015-01-14 ENCOUNTER — Ambulatory Visit
Admission: RE | Admit: 2015-01-14 | Discharge: 2015-01-14 | Disposition: A | Discharge status: Home / Self Care | Payer: 59 | Source: Ambulatory Visit | Attending: Radiation Oncology | Admitting: Radiation Oncology

## 2015-01-14 DIAGNOSIS — Z510 Encounter for antineoplastic radiation therapy: Secondary | ICD-10-CM | POA: Diagnosis not present

## 2015-01-17 ENCOUNTER — Ambulatory Visit
Admission: RE | Admit: 2015-01-17 | Discharge: 2015-01-17 | Disposition: A | Discharge status: Home / Self Care | Payer: 59 | Source: Ambulatory Visit | Attending: Radiation Oncology | Admitting: Radiation Oncology

## 2015-01-17 ENCOUNTER — Encounter: Payer: Self-pay | Admitting: Radiation Oncology

## 2015-01-17 VITALS — BP 123/77 | HR 72 | Temp 97.9°F | Ht 65.0 in | Wt 220.4 lb

## 2015-01-17 DIAGNOSIS — Z510 Encounter for antineoplastic radiation therapy: Secondary | ICD-10-CM | POA: Diagnosis not present

## 2015-01-17 DIAGNOSIS — C50411 Malignant neoplasm of upper-outer quadrant of right female breast: Secondary | ICD-10-CM

## 2015-01-17 MED ORDER — RADIAPLEXRX EX GEL
Freq: Once | CUTANEOUS | Status: AC
  Administered 2015-01-17: 15:00:00 via TOPICAL

## 2015-01-17 NOTE — Progress Notes (Signed)
   Weekly Management Note:  Outpatient    ICD-9-CM ICD-10-CM   1. Breast cancer of upper-outer quadrant of right female breast (HCC) 174.4 C50.411 hyaluronate sodium (RADIAPLEXRX) gel    Current Dose:  32 Gy  Projected Dose: 50 Gy   Narrative:  The patient presents for routine under treatment assessment.  CBCT/MVCT images/Port film x-rays were reviewed.  The chart was checked. Increased soreness of right breast, soothed by drinking tea.  Physical Findings:  height is 5\' 5"  (1.651 m) and weight is 220 lb 6.4 oz (99.973 kg). Her temperature is 97.9 F (36.6 C). Her blood pressure is 123/77 and her pulse is 72.   Wt Readings from Last 3 Encounters:  01/17/15 220 lb 6.4 oz (99.973 kg)  01/10/15 217 lb 11.2 oz (98.748 kg)  01/03/15 218 lb 8 oz (99.111 kg)   NAD, right breast skin is intact and hyperpigmented  Impression:  The patient is tolerating radiotherapy.  Plan:  Continue radiotherapy as planned. Does not feel she needs pain meds.  ________________________________   Eppie Gibson, M.D.

## 2015-01-17 NOTE — Progress Notes (Signed)
Jessica Mendoza is here for her 16th fraction of radiation to her Right Breast. Her breast is red, and hyperpigmented, and tender to touch. She also has hyperpigmentation under her breast. She has a tender area above her axilla, that hurts when she lifts her right arm above her head which she says feels "tight".  She is using her radiaplex twice daily. She admits to fatigue, and falls asleep easily if sitting still, she will also nap at times during the day. She has no other complaints at this time.  BP 123/77 mmHg  Pulse 72  Temp(Src) 97.9 F (36.6 C)  Ht 5\' 5"  (1.651 m)  Wt 220 lb 6.4 oz (99.973 kg)  BMI 36.68 kg/m2   Wt Readings from Last 3 Encounters:  01/17/15 220 lb 6.4 oz (99.973 kg)  01/10/15 217 lb 11.2 oz (98.748 kg)  01/03/15 218 lb 8 oz (99.111 kg)

## 2015-01-18 ENCOUNTER — Ambulatory Visit
Admission: RE | Admit: 2015-01-18 | Discharge: 2015-01-18 | Disposition: A | Discharge status: Home / Self Care | Payer: 59 | Source: Ambulatory Visit | Attending: Radiation Oncology | Admitting: Radiation Oncology

## 2015-01-18 DIAGNOSIS — Z510 Encounter for antineoplastic radiation therapy: Secondary | ICD-10-CM | POA: Diagnosis not present

## 2015-01-19 ENCOUNTER — Ambulatory Visit
Admission: RE | Admit: 2015-01-19 | Discharge: 2015-01-19 | Disposition: A | Discharge status: Home / Self Care | Payer: 59 | Source: Ambulatory Visit | Attending: Radiation Oncology | Admitting: Radiation Oncology

## 2015-01-19 DIAGNOSIS — Z510 Encounter for antineoplastic radiation therapy: Secondary | ICD-10-CM | POA: Diagnosis not present

## 2015-01-20 ENCOUNTER — Ambulatory Visit
Admission: RE | Admit: 2015-01-20 | Discharge: 2015-01-20 | Disposition: A | Discharge status: Home / Self Care | Payer: 59 | Source: Ambulatory Visit | Attending: Radiation Oncology | Admitting: Radiation Oncology

## 2015-01-20 DIAGNOSIS — Z510 Encounter for antineoplastic radiation therapy: Secondary | ICD-10-CM | POA: Diagnosis not present

## 2015-01-21 ENCOUNTER — Ambulatory Visit
Admission: RE | Admit: 2015-01-21 | Discharge: 2015-01-21 | Disposition: A | Discharge status: Home / Self Care | Payer: 59 | Source: Ambulatory Visit | Attending: Radiation Oncology | Admitting: Radiation Oncology

## 2015-01-21 DIAGNOSIS — Z510 Encounter for antineoplastic radiation therapy: Secondary | ICD-10-CM | POA: Diagnosis not present

## 2015-01-24 ENCOUNTER — Ambulatory Visit
Admission: RE | Admit: 2015-01-24 | Discharge: 2015-01-24 | Disposition: A | Discharge status: Home / Self Care | Payer: 59 | Source: Ambulatory Visit | Attending: Radiation Oncology | Admitting: Radiation Oncology

## 2015-01-24 ENCOUNTER — Encounter: Payer: Self-pay | Admitting: Radiation Oncology

## 2015-01-24 VITALS — BP 121/71 | HR 72 | Temp 97.8°F | Resp 12 | Wt 218.2 lb

## 2015-01-24 DIAGNOSIS — Z510 Encounter for antineoplastic radiation therapy: Secondary | ICD-10-CM | POA: Diagnosis not present

## 2015-01-24 DIAGNOSIS — C50411 Malignant neoplasm of upper-outer quadrant of right female breast: Secondary | ICD-10-CM

## 2015-01-24 MED ORDER — RADIAPLEXRX EX GEL
Freq: Once | CUTANEOUS | Status: AC
  Administered 2015-01-24: 16:00:00 via TOPICAL

## 2015-01-24 NOTE — Progress Notes (Signed)
   Weekly Management Note:  Outpatient    ICD-9-CM ICD-10-CM   1. Breast cancer of upper-outer quadrant of right female breast (HCC) 174.4 C50.411 hyaluronate sodium (RADIAPLEXRX) gel    Current Dose:  42 Gy  Projected Dose: 50 Gy   Narrative:  The patient presents for routine under treatment assessment.  CBCT/MVCT images/Port film x-rays were reviewed.  The chart was checked.  Doing well.  Mild soreness. Fatigued.  Physical Findings:  weight is 218 lb 3.2 oz (98.975 kg). Her oral temperature is 97.8 F (36.6 C). Her blood pressure is 121/71 and her pulse is 72. Her respiration is 12 and oxygen saturation is 100%.   Wt Readings from Last 3 Encounters:  01/24/15 218 lb 3.2 oz (98.975 kg)  01/17/15 220 lb 6.4 oz (99.973 kg)  01/10/15 217 lb 11.2 oz (98.748 kg)   NAD, right breast skin is still intact and hyperpigmented  Impression:  The patient is tolerating radiotherapy.  Plan:  Continue radiotherapy as planned. Card given to schedule followup 1 month for about  post RT.  ________________________________   Eppie Gibson, M.D.

## 2015-01-24 NOTE — Progress Notes (Signed)
PAIN: She rates her pain as a 3 on a scale of 0-10. constant, burning and tingling over Right axilla. SKIN: Pt right breast- positive for Hyperpigmentation, and slight Pruritus.  Pt denies edema.  Pt continues to apply Radiaplex as directed. OTHER: Pt complains of fatigue. BP 121/71 mmHg  Pulse 72  Temp(Src) 97.8 F (36.6 C) (Oral)  Resp 12  Wt 218 lb 3.2 oz (98.975 kg)  SpO2 100% Wt Readings from Last 3 Encounters:  01/24/15 218 lb 3.2 oz (98.975 kg)  01/17/15 220 lb 6.4 oz (99.973 kg)  01/10/15 217 lb 11.2 oz (98.748 kg)

## 2015-01-25 ENCOUNTER — Ambulatory Visit
Admission: RE | Admit: 2015-01-25 | Discharge: 2015-01-25 | Disposition: A | Discharge status: Home / Self Care | Payer: 59 | Source: Ambulatory Visit | Attending: Radiation Oncology | Admitting: Radiation Oncology

## 2015-01-25 DIAGNOSIS — Z510 Encounter for antineoplastic radiation therapy: Secondary | ICD-10-CM | POA: Diagnosis not present

## 2015-01-26 ENCOUNTER — Ambulatory Visit
Admission: RE | Admit: 2015-01-26 | Discharge: 2015-01-26 | Disposition: A | Discharge status: Home / Self Care | Payer: 59 | Source: Ambulatory Visit | Attending: Radiation Oncology | Admitting: Radiation Oncology

## 2015-01-26 DIAGNOSIS — Z510 Encounter for antineoplastic radiation therapy: Secondary | ICD-10-CM | POA: Diagnosis not present

## 2015-01-27 ENCOUNTER — Ambulatory Visit
Admission: RE | Admit: 2015-01-27 | Discharge: 2015-01-27 | Disposition: A | Discharge status: Home / Self Care | Payer: 59 | Source: Ambulatory Visit | Attending: Radiation Oncology | Admitting: Radiation Oncology

## 2015-01-27 DIAGNOSIS — Z510 Encounter for antineoplastic radiation therapy: Secondary | ICD-10-CM | POA: Diagnosis not present

## 2015-01-28 ENCOUNTER — Encounter: Payer: Self-pay | Admitting: Radiation Oncology

## 2015-01-28 ENCOUNTER — Ambulatory Visit: Payer: 59

## 2015-01-28 DIAGNOSIS — Z510 Encounter for antineoplastic radiation therapy: Secondary | ICD-10-CM | POA: Diagnosis not present

## 2015-01-31 ENCOUNTER — Ambulatory Visit: Payer: 59

## 2015-01-31 NOTE — Progress Notes (Signed)
  Radiation Oncology         [phone] ________________________________  Name: Jessica Mendoza MRN: [id number]  Date: 01/28/2015  DOB: 05/12/1955  End of Treatment Note  DIAGNOSIS:    ICD-9-CM ICD-10-CM   1. 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      Plan: The patient has completed radiation treatment. The patient will return to radiation oncology clinic for routine followup in one month. I advised them to call or return sooner if they have any questions or concerns related to their recovery or treatment.  -----------------------------------  Eppie Gibson, MD

## 2015-02-01 ENCOUNTER — Telehealth: Payer: Self-pay | Admitting: *Deleted

## 2015-02-01 NOTE — Telephone Encounter (Signed)
Spoke with patient to follow up after radiation completion.  She states she is doing very well.  Discussed survivorship program and encouraged her to call with any needs or concerns.

## 2015-02-04 ENCOUNTER — Other Ambulatory Visit: Payer: Self-pay | Admitting: Adult Health

## 2015-02-04 DIAGNOSIS — C50411 Malignant neoplasm of upper-outer quadrant of right female breast: Secondary | ICD-10-CM

## 2015-02-12 ENCOUNTER — Telehealth: Payer: Self-pay | Admitting: Hematology and Oncology

## 2015-02-12 NOTE — Telephone Encounter (Signed)
Survivorship appointments made and patient will get a new avs at dr Isidore Moos appointment

## 2015-02-18 ENCOUNTER — Other Ambulatory Visit: Payer: Self-pay | Admitting: *Deleted

## 2015-02-20 ENCOUNTER — Telehealth: Payer: Self-pay | Admitting: Hematology and Oncology

## 2015-02-20 NOTE — Telephone Encounter (Signed)
Called and left a message with 1/10 appointment

## 2015-02-22 ENCOUNTER — Ambulatory Visit: Payer: 59 | Admitting: Hematology and Oncology

## 2015-03-01 NOTE — Progress Notes (Signed)
Radiation Oncology         [phone] ________________________________  Name: Jessica Mendoza MRN: [id number]  Date: 03/02/2015  DOB: [birth year]  Follow-Up Visit Note  Outpatient  CC: Thora Lance, MD  Serena Croissant, MD  Diagnosis and Prior Radiotherapy:    ICD-9-CM ICD-10-CM   1. 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  Doing well. Doing the [place] program at the [facility].  Asks if soy smoothies are safe in light of her breast cancer history.                            ALLERGIES:  is allergic to simvastatin.  Meds: Current Outpatient Prescriptions  Medication Sig Dispense Refill  . amLODipine (NORVASC) 10 MG tablet Take 1 tablet (10 mg total) by mouth daily. NO MORE REFILLS WITHOUT OFFICE VISIT - 2ND NOTICE 15 tablet 0  . anastrozole (ARIMIDEX) 1 MG tablet Take 1 tablet (1 mg total) by mouth daily. 90 tablet 3  . Cholecalciferol (VITAMIN D-3) 1000 UNITS CAPS Take 1 capsule by mouth 3 (three) times a week.    . Coenzyme Q10 (CO Q 10) 100 MG CAPS Take by mouth daily.     Marland Kitchen emollient (RADIAGEL) gel Apply topically as needed for wound care.    . levothyroxine (SYNTHROID, LEVOTHROID) 25 MCG tablet Take 25 mcg by mouth daily. NEED REFILLS    . LIVALO 1 MG TABS Take 1 tablet by mouth every other day.  0  . losartan-hydrochlorothiazide (HYZAAR) 100-12.5 MG per tablet Take 1 tablet by mouth daily. NO MORE REFILLS WITHOUT OFFICE VISIT - 2ND NOTICE 15 tablet 0  . metFORMIN (GLUCOPHAGE-XR) 500 MG 24 hr tablet Take 1 tablet (500 mg total) by mouth daily with breakfast. NO MORE REFILLS WITHOUT OFFICE  VISIT - 2ND NOTICE 15 tablet 0  . metoprolol succinate (TOPROL-XL) 100 MG 24 hr tablet Take 1 tablet (100 mg total) by mouth daily. NO MORE REFILLS WITHOUT OFFICE VISIT - 2ND NOTICE 15 tablet 0  . Multiple Vitamins-Minerals (WOMENS 50+ MULTI VITAMIN/MIN) TABS Take by mouth daily.    . potassium chloride (K-DUR) 10 MEQ tablet Take 10 mEq by mouth daily.    Carlena Hurl 20 MG TABS tablet TAKE 1 TABLET (20 MG TOTAL) BY MOUTH DAILY. 30 tablet 6   No current facility-administered medications for this encounter.    Physical Findings: The patient is in no acute distress. Patient is alert and oriented.  height is 5\' 5"  (1.651 m) and weight is 220 lb 4.8 oz (99.927 kg). Her temperature is 98.5 F (36.9 C). Her blood pressure is 118/75 and her pulse is 77.  Skin has healed nicely over right breast with mild residual hyperpigmentation.  Skin intact.  Lab Findings: Lab Results  Component Value Date   WBC 5.5 09/22/2014   HGB 12.6 11/25/2014   HCT 39.1 09/22/2014   MCV 83.9 09/22/2014   PLT 251 09/22/2014    Radiographic Findings: No results found.  Impression/Plan:  Doing well  Use vitamin E lotion BID for the  next few months over right breast for more healing  Advised to limit consumption of soy to roughly 2 servings daily of foods such as 1 cup soy milk, 1/2 cup soy beans, 3 oz tofu.   I told her that powdered supplements may have more soy than we realize and I am not certain they are safe.  Continue the [employer] program at the [facility].    I encouraged her to continue with yearly mammography and followup with medical oncology. I will see her back on an as-needed basis. I have encouraged her to call if she has any issues or concerns in the future. I wished her the very best.  _____________________________________   Lonie Peak, MD

## 2015-03-01 NOTE — Assessment & Plan Note (Signed)
Rt Lumpectomy 11/25/14: IDC 1.7 cm Grade 2 with DCIS , 0/2 LN, ER 100, PR 0%, Her 2 Neg T1CN0 (Stage 1A) Oncotype DX 18, 11% risk of recurrence Adjuvant radiation therapy 12/23/2014 to 01/28/2015  Recommendation: Adjuvant antiestrogen therapy with anastrozole 1 mg daily 5 years  Anastrozole counseling:We discussed the risks and benefits of anti-estrogen therapy with aromatase inhibitors. These include but not limited to insomnia, hot flashes, mood changes, vaginal dryness, bone density loss, and weight gain. Although rare, serious side effects including endometrial cancer, risk of blood clots were also discussed. We strongly believe that the benefits far outweigh the risks. Patient understands these risks and consented to starting treatment. Planned treatment duration is 5 years.  Return to clinic in 3 months for follow-up and toxicity check

## 2015-03-02 ENCOUNTER — Ambulatory Visit
Admission: RE | Admit: 2015-03-02 | Discharge: 2015-03-02 | Disposition: A | Discharge status: Home / Self Care | Payer: 59 | Source: Ambulatory Visit | Attending: Radiation Oncology | Admitting: Radiation Oncology

## 2015-03-02 ENCOUNTER — Ambulatory Visit (HOSPITAL_BASED_OUTPATIENT_CLINIC_OR_DEPARTMENT_OTHER): Payer: 59 | Admitting: Hematology and Oncology

## 2015-03-02 ENCOUNTER — Encounter: Payer: Self-pay | Admitting: Hematology and Oncology

## 2015-03-02 ENCOUNTER — Encounter: Payer: Self-pay | Admitting: Radiation Oncology

## 2015-03-02 ENCOUNTER — Telehealth: Payer: Self-pay | Admitting: Hematology and Oncology

## 2015-03-02 VITALS — BP 118/75 | HR 77 | Temp 98.5°F | Ht 65.0 in | Wt 220.3 lb

## 2015-03-02 VITALS — BP 122/65 | HR 73 | Temp 97.9°F | Resp 18 | Ht 65.0 in | Wt 219.3 lb

## 2015-03-02 DIAGNOSIS — C50411 Malignant neoplasm of upper-outer quadrant of right female breast: Secondary | ICD-10-CM

## 2015-03-02 DIAGNOSIS — Z170 Estrogen receptor positive status [ER+]: Secondary | ICD-10-CM

## 2015-03-02 MED ORDER — ANASTROZOLE 1 MG PO TABS: 1.0000 mg | ORAL_TABLET | Freq: Every day | ORAL | Status: DC

## 2015-03-02 NOTE — Telephone Encounter (Signed)
Gave patient appointments and printed avs report.

## 2015-03-02 NOTE — Progress Notes (Signed)
Ms. Huard is here for follow up of radiation completed 01/28/15 to her Right Breast. She reports her energy level has improved and she has started back to work 30 hours a week. She also has started a [employer], and has enjoyed starting that. Her skin to her Right Breast is healed. She still has some hyperpigmentation to her axilla, and to her back. She is still using the radiaplex cream daily. She has no other concerns at this time. She has a question regarding using soy in her smoothies related to her estrogen positive breast cancer.  BP 118/75 mmHg  Pulse 77  Temp(Src) 98.5 F (36.9 C)  Ht 5\' 5"  (1.651 m)  Wt 220 lb 4.8 oz (99.927 kg)  BMI 36.66 kg/m2   Wt Readings from Last 3 Encounters:  03/02/15 220 lb 4.8 oz (99.927 kg)  03/02/15 219 lb 4.8 oz (99.474 kg)  01/24/15 218 lb 3.2 oz (98.975 kg)

## 2015-03-02 NOTE — Progress Notes (Signed)
Patient Care Team: Gaynelle Arabian, MD as PCP - General (Family Medicine) Jacelyn Pi, MD (Endocrinology) Inda Castle, MD (Gastroenterology) Vania Rea, MD (Obstetrics and Gynecology) Excell Seltzer, MD as Consulting Physician (General Surgery) Nicholas Lose, MD as Consulting Physician (Hematology and Oncology) Eppie Gibson, MD as Attending Physician (Radiation Oncology) Mauro Kaufmann, RN as Registered Nurse Rockwell Germany, RN as Registered Nurse Holley Bouche, NP as Nurse Practitioner (Nurse Practitioner) Sylvan Cheese, NP as Nurse Practitioner (Nurse Practitioner)  DIAGNOSIS: Breast cancer of upper-outer quadrant of right female breast [facility])   Staging form: Breast, AJCC 7th Edition     Clinical stage from 09/22/2014: Stage IA (T1c, N0, M0) - Unsigned   SUMMARY OF ONCOLOGIC HISTORY:   Breast cancer of upper-outer quadrant of right female breast ([place])   09/16/2014 Initial Diagnosis Right breast biopsy: Invasive ductal carcinoma, grade 2, ER 100%, PR 0%, HER-2 negative, Ki-67 10%; by ultrasound measures 1.6 cm T1 cN0 M0 stage IA clinical stage   11/25/2014 Surgery Rt  Lumpectomy: IDC 1.7 cm Grade 2 with DCIS , 0/2 LN, ER 100, PR 0%, Her 2 Neg T1CN0 (Stage 1A)Oncotype DX 18, 11% ROR   12/23/2014 - 01/28/2015 Radiation Therapy Adjuvant radiation therapy   03/02/2015 -  Anti-estrogen oral therapy Anastrozole 1 mg daily 5 years    CHIEF COMPLIANT: Follow-up after completion of radiation therapy  INTERVAL HISTORY: Jessica Mendoza is a 60-year-old with above-mentioned history of right breast cancer treated with lumpectomy and adjuvant radiation and is here today to discuss the role of adjuvant antiestrogen therapy. She has recovered very well from effects of radiation therapy may be is to have no major complaints or concerns. She continues to have mild discomfort in the right breast related to prior radiation and surgery.  REVIEW OF SYSTEMS:   Constitutional:  Denies fevers, chills or abnormal weight loss Eyes: Denies blurriness of vision Ears, nose, mouth, throat, and face: Denies mucositis or sore throat Respiratory: Denies cough, dyspnea or wheezes Cardiovascular: Denies palpitation, chest discomfort Gastrointestinal:  Denies nausea, heartburn or change in bowel habits Skin: Denies abnormal skin rashes Lymphatics: Denies new lymphadenopathy or easy bruising Neurological:Denies numbness, tingling or new weaknesses Behavioral/Psych: Mood is stable, no new changes  Extremities: No lower extremity edema Breast:  Mild discomfort in the right breast with scar tissue where she had surgery All other systems were reviewed with the patient and are negative.  I have reviewed the past medical history, past surgical history, social history and family history with the patient and they are unchanged from previous note.  ALLERGIES:  is allergic to simvastatin.  MEDICATIONS:  Current Outpatient Prescriptions  Medication Sig Dispense Refill  . amLODipine (NORVASC) 10 MG tablet Take 1 tablet (10 mg total) by mouth daily. NO MORE REFILLS WITHOUT OFFICE VISIT - 2ND NOTICE 15 tablet 0  . anastrozole (ARIMIDEX) 1 MG tablet Take 1 tablet (1 mg total) by mouth daily. 90 tablet 3  . Cholecalciferol (VITAMIN D-3) 1000 UNITS CAPS Take 1 capsule by mouth 3 (three) times a week.    . Coenzyme Q10 (CO Q 10) 100 MG CAPS Take by mouth daily.     Marland Kitchen emollient (RADIAGEL) gel Apply topically as needed for wound care.    . levothyroxine (SYNTHROID, LEVOTHROID) 25 MCG tablet Take 25 mcg by mouth daily. NEED REFILLS    . LIVALO 1 MG TABS Take 1 tablet by mouth every other day.  0  . losartan-hydrochlorothiazide (HYZAAR) 100-12.5 MG per tablet  Take 1 tablet by mouth daily. NO MORE REFILLS WITHOUT OFFICE VISIT - 2ND NOTICE 15 tablet 0  . metFORMIN (GLUCOPHAGE-XR) 500 MG 24 hr tablet Take 1 tablet (500 mg total) by mouth daily with breakfast. NO MORE REFILLS WITHOUT OFFICE VISIT -  2ND NOTICE 15 tablet 0  . metoprolol succinate (TOPROL-XL) 100 MG 24 hr tablet Take 1 tablet (100 mg total) by mouth daily. NO MORE REFILLS WITHOUT OFFICE VISIT - 2ND NOTICE 15 tablet 0  . Multiple Vitamins-Minerals (WOMENS 50+ MULTI VITAMIN/MIN) TABS Take by mouth daily.    . potassium chloride (K-DUR) 10 MEQ tablet Take 10 mEq by mouth daily.    Alveda Reasons 20 MG TABS tablet TAKE 1 TABLET (20 MG TOTAL) BY MOUTH DAILY. 30 tablet 6   No current facility-administered medications for this visit.    PHYSICAL EXAMINATION: ECOG PERFORMANCE STATUS: 1 - Symptomatic but completely ambulatory  Filed Vitals:   03/02/15 1003  BP: 122/65  Pulse: 73  Temp: 97.9 F (36.6 C)  Resp: 18   Filed Weights   03/02/15 1003  Weight: 219 lb 4.8 oz (99.474 kg)    GENERAL:alert, no distress and comfortable SKIN: skin color, texture, turgor are normal, no rashes or significant lesions EYES: normal, Conjunctiva are pink and non-injected, sclera clear OROPHARYNX:no exudate, no erythema and lips, buccal mucosa, and tongue normal  NECK: supple, thyroid normal size, non-tender, without nodularity LYMPH:  no palpable lymphadenopathy in the cervical, axillary or inguinal LUNGS: clear to auscultation and percussion with normal breathing effort HEART: regular rate & rhythm and no murmurs and no lower extremity edema ABDOMEN:abdomen soft, non-tender and normal bowel sounds MUSCULOSKELETAL:no cyanosis of digits and no clubbing  NEURO: alert & oriented x 3 with fluent speech, no focal motor/sensory deficits EXTREMITIES: No lower extremity edema  LABORATORY DATA:  I have reviewed the data as listed   Chemistry      Component Value Date/Time   NA 138 11/19/2014 0920   NA 140 09/22/2014 1248   K 4.1 11/19/2014 0920   K 3.6 09/22/2014 1248   CL 100* 11/19/2014 0920   CO2 29 11/19/2014 0920   CO2 29 09/22/2014 1248   BUN 9 11/19/2014 0920   BUN 11.9 09/22/2014 1248   CREATININE 0.89 11/19/2014 0920    CREATININE 1.1 09/22/2014 1248   CREATININE 0.79 04/07/2013 1058   GLU 127 10/10/2010      Component Value Date/Time   CALCIUM 9.0 11/19/2014 0920   CALCIUM 9.3 09/22/2014 1248   ALKPHOS 87 09/22/2014 1248   ALKPHOS 74 04/07/2013 1058   AST 37* 09/22/2014 1248   AST 23 04/07/2013 1058   ALT 61* 09/22/2014 1248   ALT 38* 04/07/2013 1058   BILITOT 0.64 09/22/2014 1248   BILITOT 0.8 04/07/2013 1058       Lab Results  Component Value Date   WBC 5.5 09/22/2014   HGB 12.6 11/25/2014   HCT 39.1 09/22/2014   MCV 83.9 09/22/2014   PLT 251 09/22/2014   NEUTROABS 2.7 09/22/2014   ASSESSMENT & PLAN:  Breast cancer of upper-outer quadrant of right female breast ([place]) Rt Lumpectomy 11/25/14: IDC 1.7 cm Grade 2 with DCIS , 0/2 LN, ER 100, PR 0%, Her 2 Neg T1CN0 (Stage 1A) Oncotype DX 18, 11% risk of recurrence Adjuvant radiation therapy 12/23/2014 to 01/28/2015  Recommendation: Adjuvant antiestrogen therapy with anastrozole 1 mg daily 5 years  Anastrozole counseling:We discussed the risks and benefits of anti-estrogen therapy with aromatase inhibitors. These include but  not limited to insomnia, hot flashes, mood changes, vaginal dryness, bone density loss, and weight gain. Although rare, serious side effects including endometrial cancer, risk of blood clots were also discussed. We strongly believe that the benefits far outweigh the risks. Patient understands these risks and consented to starting treatment. Planned treatment duration is 5 years.  Return to clinic in 3 months for follow-up and toxicity check  No orders of the defined types were placed in this encounter.   The patient has a good understanding of the overall plan. she agrees with it. she will call with any problems that may develop before the next visit here.   Rulon Eisenmenger, MD 03/02/2015

## 2015-03-31 ENCOUNTER — Encounter: Payer: Self-pay | Admitting: Nurse Practitioner

## 2015-03-31 ENCOUNTER — Ambulatory Visit (HOSPITAL_BASED_OUTPATIENT_CLINIC_OR_DEPARTMENT_OTHER): Payer: 59 | Admitting: Nurse Practitioner

## 2015-03-31 VITALS — BP 139/70 | HR 74 | Temp 97.7°F | Resp 18 | Ht 65.0 in | Wt 220.9 lb

## 2015-03-31 DIAGNOSIS — C50411 Malignant neoplasm of upper-outer quadrant of right female breast: Secondary | ICD-10-CM

## 2015-03-31 DIAGNOSIS — Z79811 Long term (current) use of aromatase inhibitors: Secondary | ICD-10-CM | POA: Diagnosis not present

## 2015-03-31 DIAGNOSIS — Z170 Estrogen receptor positive status [ER+]: Secondary | ICD-10-CM | POA: Diagnosis not present

## 2015-03-31 NOTE — Progress Notes (Signed)
CLINIC:  Cancer Survivorship   REASON FOR VISIT:  Routine follow-up post-treatment for a recent history of breast cancer.  BRIEF ONCOLOGIC HISTORY:    Breast cancer of upper-outer quadrant of right female breast ([place])   09/08/2014 Mammogram Right breast: irregular mass with calcifications at 11:00, posterior depth.   09/15/2014 Breast US Right breast: 1.6 cm irregular mass with indistinct margin at 11:00, posterior depth, 6 CFN. Related microcalcifications. Hypoechoic, correlates with mammographic findings.   09/16/2014 Initial Biopsy Right breast biopsy: Invasive ductal carcinoma, grade 2, ER+ 100%, PR- 0%, HER-2 negative, Ki-67 10%   09/16/2014 Clinical Stage Stage IA: T1c N0   09/23/2014 Procedure Breast/Ovarian panel ([facility]) reveals no clinically signficant variant at ATM, BARD1, BRCA1, BRCA2, BRIP1, CDH1, CHEK2, EPCAM, FANCC, MLH1, MSH2, MSH6, NBN, PALB2, PMS2, PTEN, RAD51C, RAD51D, TP53, and XRCC2.   11/25/2014 Definitive Surgery Right lumpectomy: IDC 1.7 cm Grade 2 with DCIS, 0/2 LN, ER+ 100, PR- 0%, HER 2 Neg    11/25/2014 Pathologic Stage Stage IA: T1c N0   11/25/2014 Oncotype testing Oncotype DX 18, 11% ROR   12/23/2014 - 01/28/2015 Radiation Therapy Adjuvant RT: Right breast / 50 Gy in 25 fractions   03/02/2015 -  Anti-estrogen oral therapy Anastrozole 1 mg daily 5 years    INTERVAL HISTORY:  Jessica Mendoza presents to the [facility] today for our initial meeting to review her survivorship care plan detailing her treatment course for breast cancer, as well as monitoring long-term side effects of that treatment, education regarding health maintenance, screening, and overall wellness and health promotion.     Overall, Jessica Mendoza reports feeling quite well since completing her radiation therapy approximately two months ago. She remains fatigued but states that the skin changes along her right breast have improved since completing radiation.  She has occasional twinges of pain  along her right breast which are self limiting.  She has noted some thickening in her right breast since completing radiation but denies any mass.  She is taking her anastrozole and has hot flashes that are bearable. She states that they are worse when she eats foods that are sweet.  She has a good appetite and denies any weight loss. She denies headache, cough, shortness of breath or bone pain.   REVIEW OF SYSTEMS:  General: Fatigue and hot flashes as above. Denies fever, chills, unintentional weight loss, or night sweats.  HEENT: Denies visual changes, hearing loss, mouth sores or difficulty swallowing. Cardiac: Denies palpitations, chest pain, and lower extremity edema.  Respiratory: Denies wheeze or dyspnea on exertion.  Breast: Occasional breast pain as above. Some thickening in right breast following radiation, otherwise, denies any new nodularity, masses, tenderness, nipple changes, or nipple discharge.  GI: Denies abdominal pain, constipation, diarrhea, nausea, or vomiting.  GU: Denies dysuria, hematuria, vaginal bleeding, vaginal discharge, or vaginal dryness.  Musculoskeletal: Denies joint or bone pain.  Neuro: Denies recent fall or numbness / tingling in her extremities. Skin: Denies rash, pruritis, or open wounds.  Psych: Denies depression, anxiety, insomnia, or memory loss.   A 14-point review of systems was completed and was negative, except as noted above.   ONCOLOGY TREATMENT TEAM:  1. Surgeon:  Dr. Excell Seltzer at [facility]  2. Medical Oncologist: Dr. Lindi Mendoza 3. Radiation Oncologist: Dr. Isidore Moos    PAST MEDICAL/SURGICAL HISTORY:  Past Medical History  Diagnosis Date  . Diabetes mellitus, type 2 ([place]) 06/2010  . Essential hypertension, benign   . Other and unspecified hyperlipidemia   . Migraines  menstrual  . Colon polyps 12/2005  . Focal nodular hyperplasia of liver     bx 2002  . Benign positional vertigo   . Goiter 10/2006  . Hypothyroidism   .  Arthritis     WRISTS AND NECK  . Glaucoma   . OSA (obstructive sleep apnea)   . Afib ([place])   . Obesity   . Family history of hypertrophic cardiomyopathy   . Hot flashes   . Breast cancer ([place]) 2016    ER+/PR-/Her2-  . Family history of ovarian cancer   . Family history of pancreatic cancer    Past Surgical History  Procedure Laterality Date  . Liver biopsy  2002  . Breast lumpectomy      right breast- benign  . Cardioversion N/A 07/11/2012    Procedure: CARDIOVERSION;  Surgeon: Larey Dresser, MD;  Location: [facility] ENDOSCOPY;  Service: Cardiovascular;  Laterality: N/A;  . Breast lumpectomy with radioactive seed and sentinel lymph node biopsy Right 11/25/2014    Procedure: RIGHT BREAST LUMPECTOMY WITH RADIOACTIVE SEED AND RIGHT AXILLARY SENTINEL LYMPH NODE BIOPSY;  Surgeon: Excell Seltzer, MD;  Location: [facility];  Service: General;  Laterality: Right;     ALLERGIES:  Allergies  Allergen Reactions  . Simvastatin     Myalgias  ?? lipitor      CURRENT MEDICATIONS:  Current Outpatient Prescriptions on File Prior to Visit  Medication Sig Dispense Refill  . amLODipine (NORVASC) 10 MG tablet Take 1 tablet (10 mg total) by mouth daily. NO MORE REFILLS WITHOUT OFFICE VISIT - 2ND NOTICE 15 tablet 0  . anastrozole (ARIMIDEX) 1 MG tablet Take 1 tablet (1 mg total) by mouth daily. 90 tablet 3  . Cholecalciferol (VITAMIN D-3) 1000 UNITS CAPS Take 1 capsule by mouth 3 (three) times a week.    . Coenzyme Q10 (CO Q 10) 100 MG CAPS Take by mouth daily.     Marland Kitchen levothyroxine (SYNTHROID, LEVOTHROID) 25 MCG tablet Take 25 mcg by mouth daily. NEED REFILLS    . LIVALO 1 MG TABS Take 1 tablet by mouth every other day.  0  . losartan-hydrochlorothiazide (HYZAAR) 100-12.5 MG per tablet Take 1 tablet by mouth daily. NO MORE REFILLS WITHOUT OFFICE VISIT - 2ND NOTICE 15 tablet 0  . metFORMIN (GLUCOPHAGE-XR) 500 MG 24 hr tablet Take 1 tablet (500 mg total) by mouth daily with breakfast.  NO MORE REFILLS WITHOUT OFFICE VISIT - 2ND NOTICE 15 tablet 0  . metoprolol succinate (TOPROL-XL) 100 MG 24 hr tablet Take 1 tablet (100 mg total) by mouth daily. NO MORE REFILLS WITHOUT OFFICE VISIT - 2ND NOTICE 15 tablet 0  . Multiple Vitamins-Minerals (WOMENS 50+ MULTI VITAMIN/MIN) TABS Take by mouth daily.    . potassium chloride (K-DUR) 10 MEQ tablet Take 10 mEq by mouth daily.    Alveda Reasons 20 MG TABS tablet TAKE 1 TABLET (20 MG TOTAL) BY MOUTH DAILY. 30 tablet 6   No current facility-administered medications on file prior to visit.     ONCOLOGIC FAMILY HISTORY:  Family History  Problem Relation Age of Onset  . Hypertension Mother   . Heart attack Mother   . Alcohol abuse Father   . Hypertension Brother   . Hyperlipidemia Brother   . Ovarian cancer Maternal Aunt 56  . Pancreatic cancer Maternal Grandfather 34  . Breast cancer Cousin 41    Mother's paternal first cousin, had breast cancer again at 25  . Ovarian cancer Cousin 49    "  groin cancer"  . Hypertension Paternal Grandmother   . Diabetes Paternal Grandmother   . Liver cancer Paternal Aunt 62    ETOH related     GENETIC COUNSELING/TESTING: Yes, performed 09/23/2014: Breast/Ovarian panel ([facility]) reveals no clinically signficant variant at ATM, BARD1, BRCA1, BRCA2, BRIP1, CDH1, CHEK2, EPCAM, FANCC, MLH1, MSH2, MSH6, NBN, PALB2, PMS2, PTEN, RAD51C, RAD51D, TP53, and XRCC2.   SOCIAL HISTORY:  KAYSEA RAYA is married and lives with her spouse in [place], [place].  She has 1 child. 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  She is a former smoker and denies any current or history of illicit drug use.  She uses alcohol on occasion.   PHYSICAL EXAMINATION:  Vital Signs: Filed Vitals:   03/31/15 1429  BP: 139/70  Pulse: 74  Temp: 97.7 F (36.5 C)  Resp: 18   ECOG Performance Status: 0  General: Well-nourished, well-appearing female in no acute distress.  She is  accompanied in clinic by her husband today.   HEENT: Head is atraumatic and normocephalic.  Pupils equal and reactive to light and accomodation. Conjunctivae clear without exudate.  Sclerae anicteric. Oral mucosa is pink, moist, and intact without lesions.  Oropharynx is pink without lesions or erythema.  Lymph: No cervical, supraclavicular, infraclavicular, or axillary lymphadenopathy noted on palpation.  Cardiovascular: Regular rate and rhythm without murmurs, rubs, or gallops. Respiratory: Clear to auscultation bilaterally. Chest expansion symmetric without accessory muscle use on inspiration or expiration.  GI: Abdomen soft and round. No tenderness to palpation. Bowel sounds normoactive in 4 quadrants.    GU: Deferred. .   Neuro: No focal deficits. Steady gait.  Psych: Mood and affect normal and appropriate for situation.  Extremities: No edema, cyanosis, or clubbing.  Skin: Warm and dry. No open lesions noted.   LABORATORY DATA:  None for this visit.  DIAGNOSTIC IMAGING:  None for this visit.     ASSESSMENT AND PLAN:   1. Breast cancer: Stage IA invasive ductal carcinoma with ductal carcinoma in situ of the right breast (09/2014), grade 2, ER positive, PR negative, HER2/neu negative, S/P lumpectomy (11/2014) followed by adjuvant radiation therapy completed 01/2015 now on adjuvant endocrine therapy with anastrozole.  Jessica Mendoza is doing well without clinical symptoms worrisome for disease recurrence. She will follow-up with her medical oncologist,  Dr. Lindi Mendoza, in April 2017 with history and physical examination per surveillance protocol.  She will see Dr. Excell Seltzer as scheduled. She will continue her anti-estrogen therapy with anastrozole as prescribed by Dr. Lindi Mendoza at this time. She was instructed to make Dr. Lindi Mendoza or myself aware if she begins to experience any new or increased side effects of the medication and I could see her back in clinic to help manage those side effects, as needed.  A comprehensive survivorship care plan and treatment summary was reviewed with the patient today detailing her breast cancer diagnosis, treatment course, potential late/long-term effects of treatment, appropriate follow-up care with recommendations for the future, and patient education resources.  A copy of this summary, along with a letter will be sent to the patient's primary care provider via in basket message after today's visit.  Jessica Mendoza is welcome to return to the Survivorship Clinic in the future, as needed; no follow-up will be scheduled at this time.    2. Bone health:  Given Jessica Mendoza's age/history of breast cancer and her current treatment regimen including endocrine therapy with anastrozole, she is at risk for bone demineralization.  Per our records, her last DEXA scan was performed July 2015 and was normal. We will continue to monitor this closely while she is on endocrine therapy.  In the meantime, she was encouraged to increase her consumption of foods rich in calcium and vitamin D as well as to increase her weight-bearing activities.  She was given education on specific activities to promote bone health.  3. Cancer screening:  Due to Jessica Mendoza's history and her age, she should receive screening for skin cancers, colon cancer, and gynecologic cancers.  The information and recommendations are listed on the patient's comprehensive care plan/treatment summary and were reviewed in detail with the patient.    4. Health maintenance and wellness promotion: Jessica Mendoza was encouraged to consume 5-7 servings of fruits and vegetables per day. We reviewed the "Nutrition Rainbow" handout, as well as discussed recommendations to maximize nutrition and minimize recurrence, such as increased intake of fruits, vegetables, lean proteins, and minimizing the intake of red meats and processed foods.  She was also encouraged to engage in moderate to vigorous exercise for 30 minutes per day most days of  the week. Jessica Mendoza is currently participating in the [facility] fitness program, which is designed for cancer survivors to help them become more physically fit after cancer treatments.  She was instructed to limit her alcohol consumption and continue to abstain from tobacco use.  A copy of the "Take Control of Your Health" brochure was given to her reinforcing these recommendations.   5. Support services/counseling: It is not uncommon for this period of the patient's cancer care trajectory to be one of many emotions and stressors.  Jessica Mendoza is currently participating in the [facility] ("Finding Your New Normal") support group series designed for patients after they have completed treatment.  Jessica Mendoza was encouraged to take advantage of our many other support services programs, support groups, and/or counseling in coping with her new life as a cancer survivor after completing anti-cancer treatment.  She was offered support today through active listening and expressive supportive counseling.  She was given information regarding our available services and encouraged to contact me with any questions or for help enrolling in any of our support group/programs.    A total of 50 minutes of face-to-face time was spent with this patient with greater than 50% of that time in counseling and care-coordination.   Sylvan Cheese, NP  Survivorship Program [facility] [phone]   Note: PRIMARY CARE PROVIDER Simona Huh, [place] [phone]

## 2015-04-01 ENCOUNTER — Encounter: Payer: 59 | Admitting: Nurse Practitioner

## 2015-04-01 DIAGNOSIS — Y9342 Activity, yoga: Secondary | ICD-10-CM

## 2015-04-01 NOTE — Congregational Nurse Program (Signed)
Congregational Nurse Program Note  Date of Encounter: 04/01/2015  Past Medical History: No past medical history on file.  Encounter Details:     CNP Questionnaire - 04/01/15 1048    Patient Demographics   Is this a new or existing patient? New   Patient is considered a/an Not Applicable   Race African-American/Black   Patient Assistance   Location of Patient Assistance Not Applicable   Patient's financial/insurance status Private Insurance Coverage   Uninsured Patient No   Patient referred to apply for the following financial assistance Not Applicable   Food insecurities addressed Not Applicable   Transportation assistance No   Assistance securing medications No   Educational health offerings Exercise/physical activity   Encounter Details   Primary purpose of visit Education/Health Concerns   Was an Emergency Department visit averted? Not Applicable   Does patient have a medical provider? Yes   Patient referred to Not Applicable   Was a mental health screening completed? (GAINS tool) No   Does patient have dental issues? No   Does patient have vision issues? No   Since previous encounter, have you referred patient for abnormal blood pressure that resulted in a new diagnosis or medication change? No   Since previous encounter, have you referred patient for abnormal blood glucose that resulted in a new diagnosis or medication change? No   For Abstraction Use Only   Does patient have insurance? Yes       Attended chair yoga class.

## 2015-05-23 DIAGNOSIS — Y9342 Activity, yoga: Secondary | ICD-10-CM

## 2015-05-23 NOTE — Congregational Nurse Program (Signed)
Congregational Nurse Program Note  Date of Encounter: 05/23/2015  Past Medical History: No past medical history on file.  Encounter Details:     CNP Questionnaire - 05/23/15 0940    Patient Demographics   Is this a new or existing patient? Existing   Patient is considered a/an Not Applicable   Race African-American/Black   Patient Assistance   Location of Patient Assistance Not Applicable   Patient's financial/insurance status Private Insurance Coverage   Uninsured Patient No   Patient referred to apply for the following financial assistance Not Applicable   Food insecurities addressed Not Applicable   Transportation assistance No   Assistance securing medications No   Educational health offerings Exercise/physical activity   Encounter Details   Primary purpose of visit Education/Health Concerns   Was an Emergency Department visit averted? Not Applicable   Does patient have a medical provider? Yes   Patient referred to Not Applicable   Was a mental health screening completed? (GAINS tool) No   Does patient have dental issues? No   Does patient have vision issues? No   Since previous encounter, have you referred patient for abnormal blood pressure that resulted in a new diagnosis or medication change? No   Since previous encounter, have you referred patient for abnormal blood glucose that resulted in a new diagnosis or medication change? No   For Abstraction Use Only   Does patient have insurance? Yes       Attended chair yoga class.

## 2015-06-01 ENCOUNTER — Telehealth: Payer: Self-pay | Admitting: Hematology and Oncology

## 2015-06-01 ENCOUNTER — Encounter: Payer: Self-pay | Admitting: Hematology and Oncology

## 2015-06-01 ENCOUNTER — Ambulatory Visit (HOSPITAL_BASED_OUTPATIENT_CLINIC_OR_DEPARTMENT_OTHER): Payer: 59 | Admitting: Hematology and Oncology

## 2015-06-01 DIAGNOSIS — Z79811 Long term (current) use of aromatase inhibitors: Secondary | ICD-10-CM

## 2015-06-01 DIAGNOSIS — C50411 Malignant neoplasm of upper-outer quadrant of right female breast: Secondary | ICD-10-CM

## 2015-06-01 NOTE — Telephone Encounter (Signed)
appt made and avs printed °

## 2015-06-01 NOTE — Assessment & Plan Note (Signed)
Rt Lumpectomy 11/25/14: IDC 1.7 cm Grade 2 with DCIS , 0/2 LN, ER 100, PR 0%, Her 2 Neg T1CN0 (Stage 1A) Oncotype DX 18, 11% risk of recurrence Adjuvant radiation therapy 12/23/2014 to 01/28/2015  Current treatment: Adjuvant antiestrogen therapy with anastrozole 1 mg daily 5 years started 03/02/2015 Anastrozole toxicities: 1. Occasional hot flashes 2-3 times a week Denies any arthralgias or myalgias.  Surveillance for breast cancer: Mammograms need to be done in July at [facility]. I would also like to order a bone density test the same time.  Return to clinic in 6 months for follow-up

## 2015-06-01 NOTE — Progress Notes (Signed)
Unable to get in to exam room prior to MD.  No assessment performed.  

## 2015-06-01 NOTE — Progress Notes (Signed)
Patient Care Team: Gaynelle Arabian, MD as PCP - General (Family Medicine) Jacelyn Pi, MD (Endocrinology) Inda Castle, MD (Gastroenterology) Vania Rea, MD (Obstetrics and Gynecology) Excell Seltzer, MD as Consulting Physician (General Surgery) Nicholas Lose, MD as Consulting Physician (Hematology and Oncology) Eppie Gibson, MD as Attending Physician (Radiation Oncology) Mauro Kaufmann, RN as Registered Nurse Rockwell Germany, RN as Registered Nurse Holley Bouche, NP as Nurse Practitioner (Nurse Practitioner) Sylvan Cheese, NP as Nurse Practitioner (Nurse Practitioner)  DIAGNOSIS: Breast cancer of upper-outer quadrant of right female breast [facility])   Staging form: Breast, AJCC 7th Edition     Clinical stage from 09/22/2014: Stage IA (T1c, N0, M0) - Unsigned     Pathologic stage from 11/25/2014: Stage IA (T1c, N0, cM0) - Unsigned SUMMARY OF ONCOLOGIC HISTORY:   Breast cancer of upper-outer quadrant of right female breast ([place])   09/08/2014 Mammogram Right breast: irregular mass with calcifications at 11:00, posterior depth.   09/15/2014 Breast US Right breast: 1.6 cm irregular mass with indistinct margin at 11:00, posterior depth, 6 CFN. Related microcalcifications. Hypoechoic, correlates with mammographic findings.   09/16/2014 Initial Biopsy Right breast biopsy: Invasive ductal carcinoma, grade 2, ER+ 100%, PR- 0%, HER-2 negative, Ki-67 10%   09/16/2014 Clinical Stage Stage IA: T1c N0   09/23/2014 Procedure Breast/Ovarian panel ([facility]) reveals no clinically signficant variant at ATM, BARD1, BRCA1, BRCA2, BRIP1, CDH1, CHEK2, EPCAM, FANCC, MLH1, MSH2, MSH6, NBN, PALB2, PMS2, PTEN, RAD51C, RAD51D, TP53, and XRCC2.   11/25/2014 Definitive Surgery Right lumpectomy: IDC 1.7 cm Grade 2 with DCIS, 0/2 LN, ER+ 100, PR- 0%, HER 2 Neg    11/25/2014 Pathologic Stage Stage IA: T1c N0   11/25/2014 Oncotype testing Oncotype DX 18, 11% ROR   12/23/2014 - 01/28/2015 Radiation Therapy  Adjuvant RT: Right breast / 50 Gy in 25 fractions   03/02/2015 -  Anti-estrogen oral therapy Anastrozole 1 mg daily 5 years   03/31/2015 Survivorship Survivorship visit completed and copy of care plan given to patient    CHIEF COMPLIANT: Follow-up on anastrozole  INTERVAL HISTORY: Jessica Mendoza is a 60 year old with above-mentioned history of right breast cancer currently on anastrozole adjuvant therapy. She is here for three-month follow-up and toxicity check. She tells me that she has had hot flashes 2 or 3 times a week. They're not too severe. She does not have any arthralgias or myalgias. She had completed the live strong program and appears to have done extremely well. She attributes to hot flashes to eating sweets.  REVIEW OF SYSTEMS:   Constitutional: Denies fevers, chills or abnormal weight loss Eyes: Denies blurriness of vision Ears, nose, mouth, throat, and face: Denies mucositis or sore throat Respiratory: Denies cough, dyspnea or wheezes Cardiovascular: Denies palpitation, chest discomfort Gastrointestinal:  Denies nausea, heartburn or change in bowel habits Skin: Denies abnormal skin rashes Lymphatics: Denies new lymphadenopathy or easy bruising Neurological:Denies numbness, tingling or new weaknesses Behavioral/Psych: Mood is stable, no new changes  Extremities: No lower extremity edema Breast:  denies any pain or lumps or nodules in either breasts All other systems were reviewed with the patient and are negative.  I have reviewed the past medical history, past surgical history, social history and family history with the patient and they are unchanged from previous note.  ALLERGIES:  is allergic to simvastatin.  MEDICATIONS:  Current Outpatient Prescriptions  Medication Sig Dispense Refill  . amLODipine (NORVASC) 10 MG tablet Take 1 tablet (10 mg total) by mouth daily. NO MORE  REFILLS WITHOUT OFFICE VISIT - 2ND NOTICE 15 tablet 0  . anastrozole (ARIMIDEX) 1 MG tablet  Take 1 tablet (1 mg total) by mouth daily. 90 tablet 3  . Cholecalciferol (VITAMIN D-3) 1000 UNITS CAPS Take 1 capsule by mouth 3 (three) times a week.    . Coenzyme Q10 (CO Q 10) 100 MG CAPS Take by mouth daily.     Marland Kitchen levothyroxine (SYNTHROID, LEVOTHROID) 25 MCG tablet Take 25 mcg by mouth daily. NEED REFILLS    . LIVALO 1 MG TABS Take 1 tablet by mouth every other day.  0  . losartan-hydrochlorothiazide (HYZAAR) 100-12.5 MG per tablet Take 1 tablet by mouth daily. NO MORE REFILLS WITHOUT OFFICE VISIT - 2ND NOTICE 15 tablet 0  . metFORMIN (GLUCOPHAGE-XR) 500 MG 24 hr tablet Take 1 tablet (500 mg total) by mouth daily with breakfast. NO MORE REFILLS WITHOUT OFFICE VISIT - 2ND NOTICE 15 tablet 0  . metoprolol succinate (TOPROL-XL) 100 MG 24 hr tablet Take 1 tablet (100 mg total) by mouth daily. NO MORE REFILLS WITHOUT OFFICE VISIT - 2ND NOTICE 15 tablet 0  . Multiple Vitamins-Minerals (WOMENS 50+ MULTI VITAMIN/MIN) TABS Take by mouth daily.    . potassium chloride (K-DUR) 10 MEQ tablet Take 10 mEq by mouth daily.    Alveda Reasons 20 MG TABS tablet TAKE 1 TABLET (20 MG TOTAL) BY MOUTH DAILY. 30 tablet 6   No current facility-administered medications for this visit.    PHYSICAL EXAMINATION: ECOG PERFORMANCE STATUS: 0 - Asymptomatic  Filed Vitals:   06/01/15 0846  BP: 116/69  Pulse: 81  Temp: 98 F (36.7 C)  Resp: 18   Filed Weights   06/01/15 0846  Weight: 217 lb 4.8 oz (98.567 kg)    GENERAL:alert, no distress and comfortable SKIN: skin color, texture, turgor are normal, no rashes or significant lesions EYES: normal, Conjunctiva are pink and non-injected, sclera clear OROPHARYNX:no exudate, no erythema and lips, buccal mucosa, and tongue normal  NECK: supple, thyroid normal size, non-tender, without nodularity LYMPH:  no palpable lymphadenopathy in the cervical, axillary or inguinal LUNGS: clear to auscultation and percussion with normal breathing effort HEART: regular rate & rhythm  and no murmurs and no lower extremity edema ABDOMEN:abdomen soft, non-tender and normal bowel sounds MUSCULOSKELETAL:no cyanosis of digits and no clubbing  NEURO: alert & oriented x 3 with fluent speech, no focal motor/sensory deficits EXTREMITIES: No lower extremity edema  LABORATORY DATA:  I have reviewed the data as listed   Chemistry      Component Value Date/Time   NA 138 11/19/2014 0920   NA 140 09/22/2014 1248   K 4.1 11/19/2014 0920   K 3.6 09/22/2014 1248   CL 100* 11/19/2014 0920   CO2 29 11/19/2014 0920   CO2 29 09/22/2014 1248   BUN 9 11/19/2014 0920   BUN 11.9 09/22/2014 1248   CREATININE 0.89 11/19/2014 0920   CREATININE 1.1 09/22/2014 1248   CREATININE 0.79 04/07/2013 1058   GLU 127 10/10/2010      Component Value Date/Time   CALCIUM 9.0 11/19/2014 0920   CALCIUM 9.3 09/22/2014 1248   ALKPHOS 87 09/22/2014 1248   ALKPHOS 74 04/07/2013 1058   AST 37* 09/22/2014 1248   AST 23 04/07/2013 1058   ALT 61* 09/22/2014 1248   ALT 38* 04/07/2013 1058   BILITOT 0.64 09/22/2014 1248   BILITOT 0.8 04/07/2013 1058      Lab Results  Component Value Date   WBC 5.5 09/22/2014  HGB 12.6 11/25/2014   HCT 39.1 09/22/2014   MCV 83.9 09/22/2014   PLT 251 09/22/2014   NEUTROABS 2.7 09/22/2014   ASSESSMENT & PLAN:  Breast cancer of upper-outer quadrant of right female breast ([place]) Rt Lumpectomy 11/25/14: IDC 1.7 cm Grade 2 with DCIS , 0/2 LN, ER 100, PR 0%, Her 2 Neg T1CN0 (Stage 1A) Oncotype DX 18, 11% risk of recurrence Adjuvant radiation therapy 12/23/2014 to 01/28/2015  Current treatment: Adjuvant antiestrogen therapy with anastrozole 1 mg daily 5 years started 03/02/2015 Anastrozole toxicities: 1. Occasional hot flashes 2-3 times a week (attributes them to eating sweets ) Denies any arthralgias or myalgias.  Survivorship: Encouraged her to continue to exercise and stay active. Surveillance for breast cancer: Mammograms need to be done in July at [facility]. I  would also like to order a bone density test the same time.  Return to clinic in 6 months for follow-up   Orders Placed This Encounter  Procedures  . DG Bone Density    Standing Status: Future     Number of Occurrences:      Standing Expiration Date: 05/31/2016    Order Specific Question:  Reason for Exam (SYMPTOM  OR DIAGNOSIS REQUIRED)    Answer:  Evaluation post menopausal patient on Anastrozole    Order Specific Question:  Is the patient pregnant?    Answer:  No    Order Specific Question:  Preferred imaging location?    Answer:  External     Comments:  [facility]   The patient has a good understanding of the overall plan. she agrees with it. she will call with any problems that may develop before the next visit here.   Rulon Eisenmenger, MD 06/01/2015

## 2015-06-10 ENCOUNTER — Other Ambulatory Visit: Payer: Self-pay | Admitting: Internal Medicine

## 2015-07-26 DIAGNOSIS — Y9342 Activity, yoga: Secondary | ICD-10-CM

## 2015-07-26 NOTE — Congregational Nurse Program (Signed)
Congregational Nurse Program Note  Date of Encounter: 07/26/2015  Past Medical History: Past Medical History  Diagnosis Date  . Diabetes mellitus, type 2 ([place]) 06/2010  . Essential hypertension, benign   . Other and unspecified hyperlipidemia   . Migraines     menstrual  . Colon polyps 12/2005  . Focal nodular hyperplasia of liver     bx 2002  . Benign positional vertigo   . Goiter 10/2006  . Hypothyroidism   . Arthritis     WRISTS AND NECK  . Glaucoma   . OSA (obstructive sleep apnea)   . Afib ([place])   . Obesity   . Family history of hypertrophic cardiomyopathy   . Hot flashes   . Breast cancer ([place]) 2016    ER+/PR-/Her2-  . Family history of ovarian cancer   . Family history of pancreatic cancer     Encounter Details:     CNP Questionnaire - 07/26/15 1424    Patient Demographics   Is this a new or existing patient? Existing   Patient is considered a/an Not Applicable   Race African-American/Black   Patient Assistance   Location of Patient Assistance Not Applicable   Patient's financial/insurance status Private Insurance Coverage   Uninsured Patient No   Patient referred to apply for the following financial assistance Not Applicable   Food insecurities addressed Not Applicable   Transportation assistance No   Assistance securing medications No   Educational health offerings Exercise/physical activity   Encounter Details   Primary purpose of visit Education/Health Concerns   Was an Emergency Department visit averted? Not Applicable   Does patient have a medical provider? Yes   Patient referred to Not Applicable   Was a mental health screening completed? (GAINS tool) No   Does patient have dental issues? No   Does patient have vision issues? No   Does your patient have an abnormal blood pressure today? No   Since previous encounter, have you referred patient for abnormal blood pressure that resulted in a new diagnosis or medication change? No   Does your  patient have an abnormal blood glucose today? No   Since previous encounter, have you referred patient for abnormal blood glucose that resulted in a new diagnosis or medication change? No   Was there a life-saving intervention made? No       Attended chair yoga class.

## 2015-08-09 ENCOUNTER — Other Ambulatory Visit: Payer: Self-pay | Admitting: Obstetrics & Gynecology

## 2015-09-28 ENCOUNTER — Other Ambulatory Visit: Payer: Self-pay | Admitting: Endocrinology

## 2015-09-29 ENCOUNTER — Other Ambulatory Visit: Payer: Self-pay | Admitting: Endocrinology

## 2015-09-29 DIAGNOSIS — E049 Nontoxic goiter, unspecified: Secondary | ICD-10-CM

## 2015-09-29 DIAGNOSIS — Y9342 Activity, yoga: Secondary | ICD-10-CM

## 2015-09-29 NOTE — Congregational Nurse Program (Signed)
Congregational Nurse Program Note  Date of Encounter: 09/29/2015  Past Medical History: Past Medical History:  Diagnosis Date  . Afib ([place])   . Arthritis    WRISTS AND NECK  . Benign positional vertigo   . Breast cancer ([place]) 2016   ER+/PR-/Her2-  . Colon polyps 12/2005  . Diabetes mellitus, type 2 ([place]) 06/2010  . Essential hypertension, benign   . Family history of hypertrophic cardiomyopathy   . Family history of ovarian cancer   . Family history of pancreatic cancer   . Focal nodular hyperplasia of liver    bx 2002  . Glaucoma   . Goiter 10/2006  . Hot flashes   . Hypothyroidism   . Migraines    menstrual  . Obesity   . OSA (obstructive sleep apnea)   . Other and unspecified hyperlipidemia     Encounter Details:     CNP Questionnaire - 09/21/15 1101      Patient Demographics   Is this a new or existing patient? Existing   Patient is considered a/an Not Applicable   Race African-American/Black     Patient Assistance   Location of Patient Assistance Not Applicable   Patient's financial/insurance status Private Insurance Coverage   Uninsured Patient No   Patient referred to apply for the following financial assistance Not Applicable   Food insecurities addressed Not Applicable   Transportation assistance No   Assistance securing medications No   Educational health offerings Exercise/physical activity     Encounter Details   Primary purpose of visit Education/Health Concerns   Was an Emergency Department visit averted? Not Applicable   Does patient have a medical provider? Yes   Patient referred to Not Applicable   Was a mental health screening completed? (GAINS tool) No   Does patient have dental issues? No   Does patient have vision issues? No   Does your patient have an abnormal blood pressure today? No   Since previous encounter, have you referred patient for abnormal blood pressure that resulted in a new diagnosis or medication change? No   Does your  patient have an abnormal blood glucose today? No   Since previous encounter, have you referred patient for abnormal blood glucose that resulted in a new diagnosis or medication change? No   Was there a life-saving intervention made? No      Attended chair yoga class.

## 2015-10-03 ENCOUNTER — Encounter: Payer: Self-pay | Admitting: Gastroenterology

## 2015-10-06 ENCOUNTER — Encounter: Payer: Self-pay | Admitting: Gastroenterology

## 2015-10-17 ENCOUNTER — Other Ambulatory Visit: Payer: Self-pay | Admitting: Internal Medicine

## 2015-11-01 ENCOUNTER — Other Ambulatory Visit: Payer: Self-pay | Admitting: Obstetrics & Gynecology

## 2015-11-03 LAB — CYTOLOGY - PAP

## 2015-11-09 ENCOUNTER — Ambulatory Visit
Admission: RE | Admit: 2015-11-09 | Discharge: 2015-11-09 | Disposition: A | Discharge status: Home / Self Care | Payer: 59 | Source: Ambulatory Visit | Attending: Endocrinology | Admitting: Endocrinology

## 2015-11-09 DIAGNOSIS — E049 Nontoxic goiter, unspecified: Secondary | ICD-10-CM

## 2015-11-10 DIAGNOSIS — Y9342 Activity, yoga: Secondary | ICD-10-CM

## 2015-11-10 NOTE — Congregational Nurse Program (Signed)
Attended chair yoga class.Congregational Nurse Program Note  Date of Encounter: 11/10/2015  Past Medical History: Past Medical History:  Diagnosis Date  . Afib ([place])   . Arthritis    WRISTS AND NECK  . Benign positional vertigo   . Breast cancer ([place]) 2016   ER+/PR-/Her2-  . Colon polyps 12/2005  . Diabetes mellitus, type 2 ([place]) 06/2010  . Essential hypertension, benign   . Family history of hypertrophic cardiomyopathy   . Family history of ovarian cancer   . Family history of pancreatic cancer   . Focal nodular hyperplasia of liver    bx 2002  . Glaucoma   . Goiter 10/2006  . Hot flashes   . Hypothyroidism   . Migraines    menstrual  . Obesity   . OSA (obstructive sleep apnea)   . Other and unspecified hyperlipidemia     Encounter Details:     CNP Questionnaire - 11/10/15 1830      Patient Demographics   Is this a new or existing patient? Existing   Patient is considered a/an Not Applicable   Race African-American/Black     Patient Assistance   Location of Patient Assistance Not Applicable   Patient's financial/insurance status Private Insurance Coverage   Uninsured Patient No   Patient referred to apply for the following financial assistance Not Applicable   Food insecurities addressed Not Applicable   Transportation assistance No   Assistance securing medications No   Educational health offerings Exercise/physical activity     Encounter Details   Primary purpose of visit Education/Health Concerns   Was an Emergency Department visit averted? Not Applicable   Does patient have a medical provider? Yes   Patient referred to Not Applicable   Was a mental health screening completed? (GAINS tool) No   Does patient have dental issues? No   Does patient have vision issues? No   Does your patient have an abnormal blood pressure today? No   Since previous encounter, have you referred patient for abnormal blood pressure that resulted in a new diagnosis or  medication change? No   Does your patient have an abnormal blood glucose today? No   Since previous encounter, have you referred patient for abnormal blood glucose that resulted in a new diagnosis or medication change? No   Was there a life-saving intervention made? No

## 2015-11-13 IMAGING — US US SOFT TISSUE HEAD/NECK
1 series · 14 of 25 positions shown · non-contrast
Comparison: 10/19/2011

CLINICAL DATA: Follow-up nodule

EXAM:
THYROID ULTRASOUND
TECHNIQUE: Ultrasound examination of the thyroid gland and adjacent soft
tissues was performed.

[Series 1: us soft tissue head/neck · 0.12mm/px · 14 of 57 slices shown]
[im 1/57]
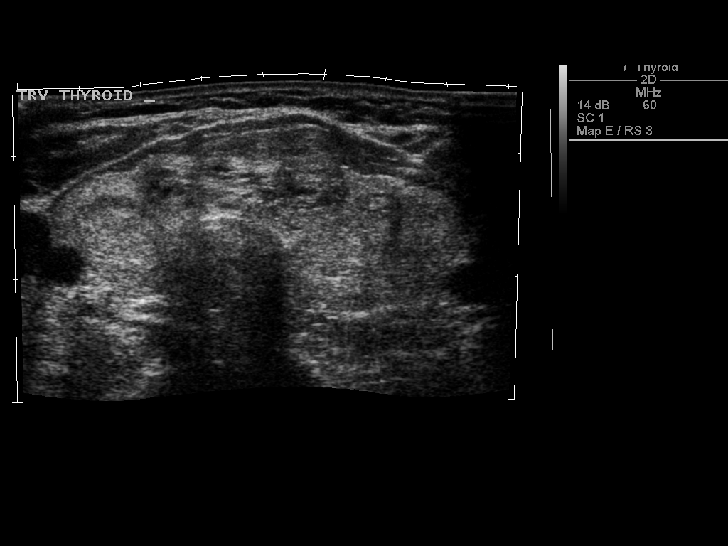
[im 5/57]
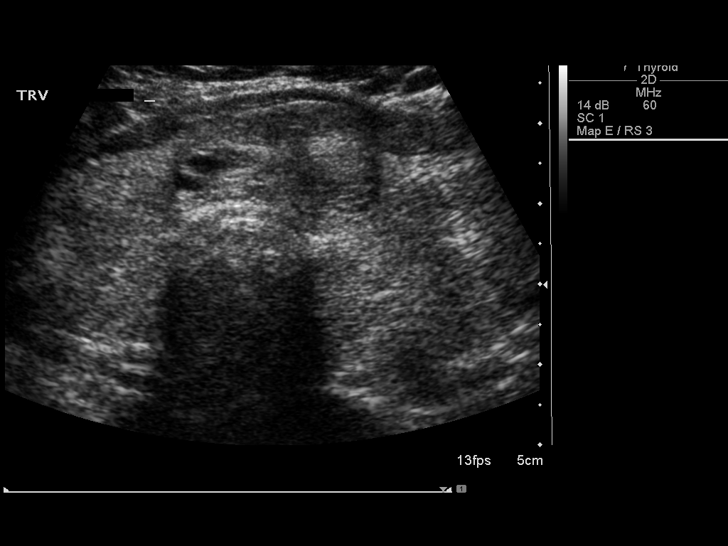
[im 10/57]
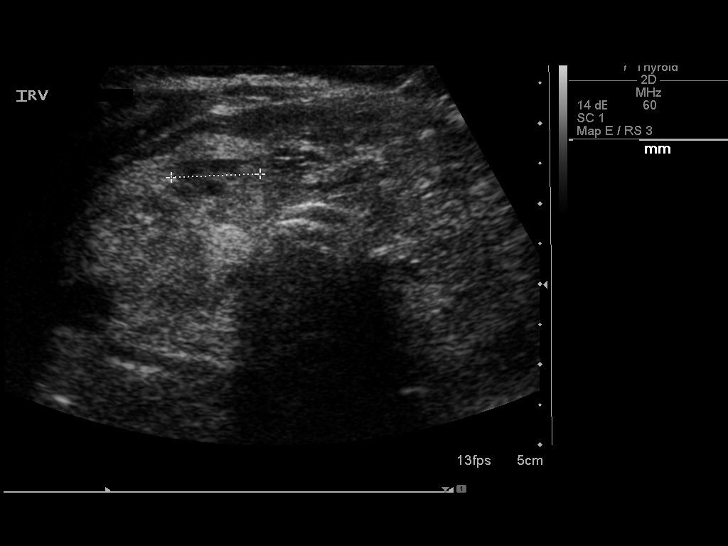
[im 15/57]
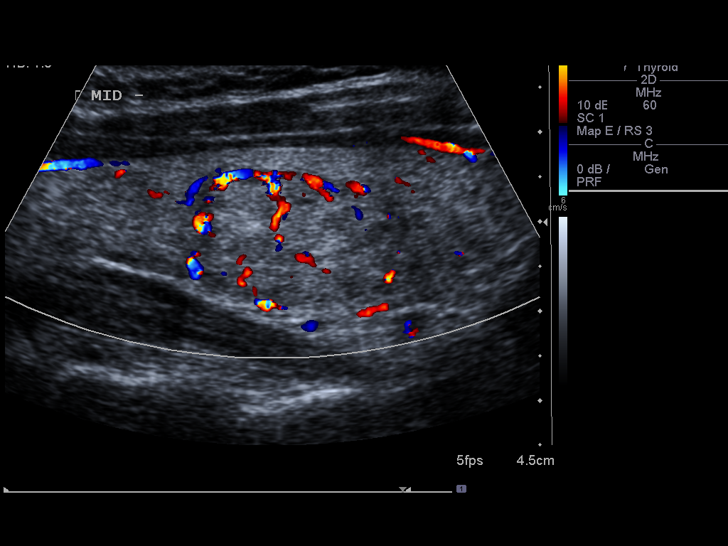
[im 19/57]
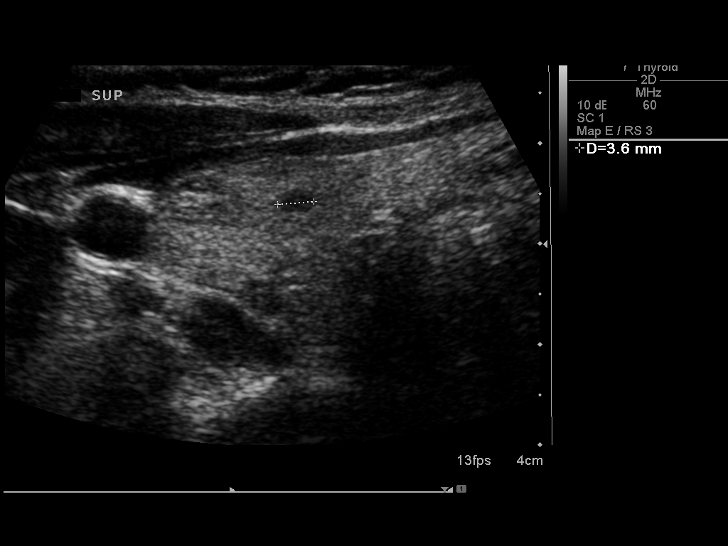
[im 22/57]
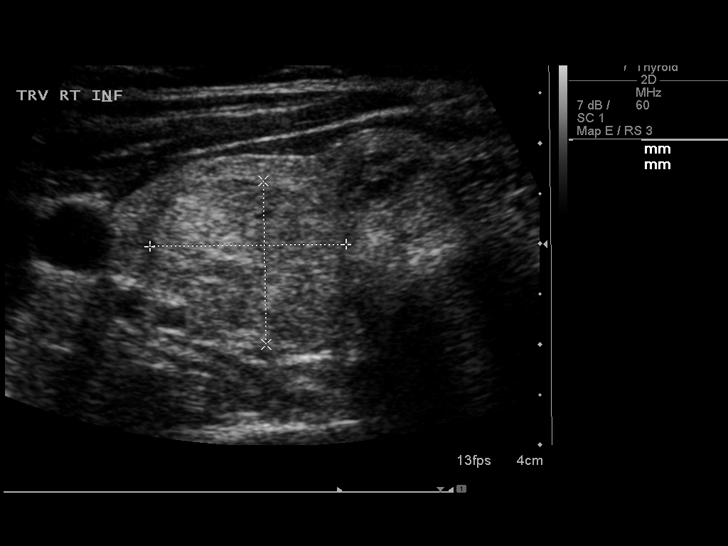
[im 26/57]
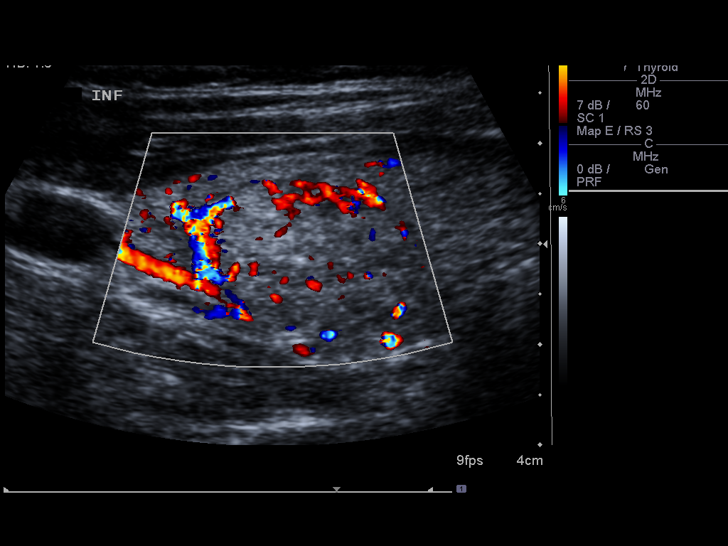
[im 31/57]
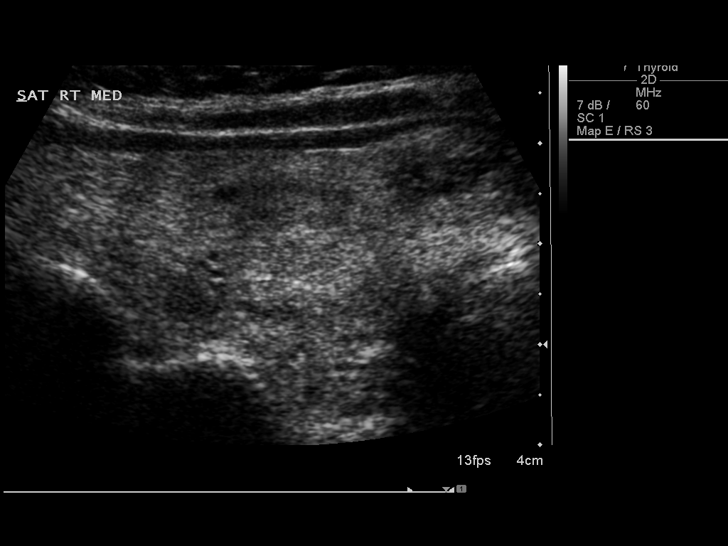
[im 36/57]
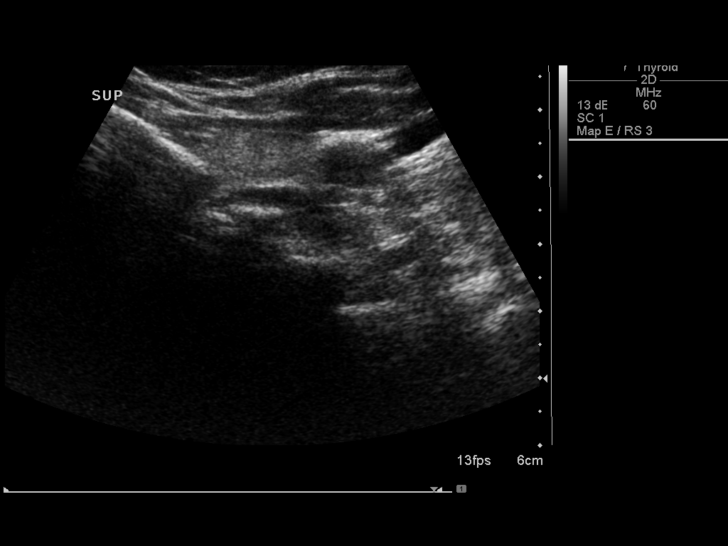
[im 38/57]
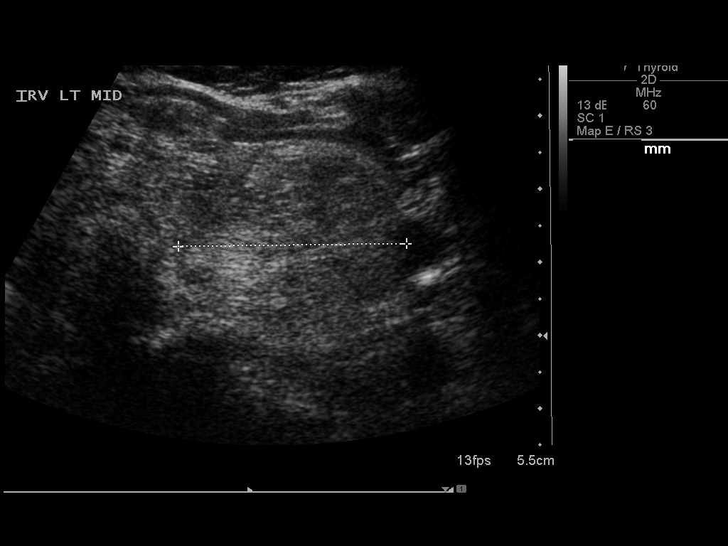
[im 43/57]
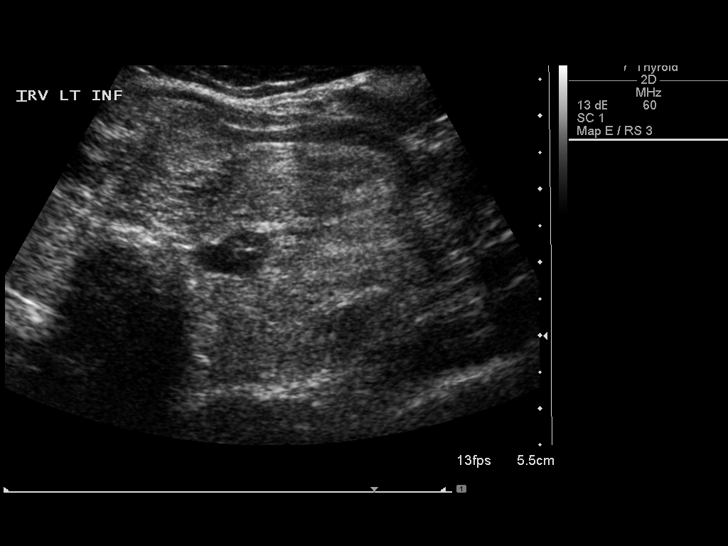
[im 47/57]
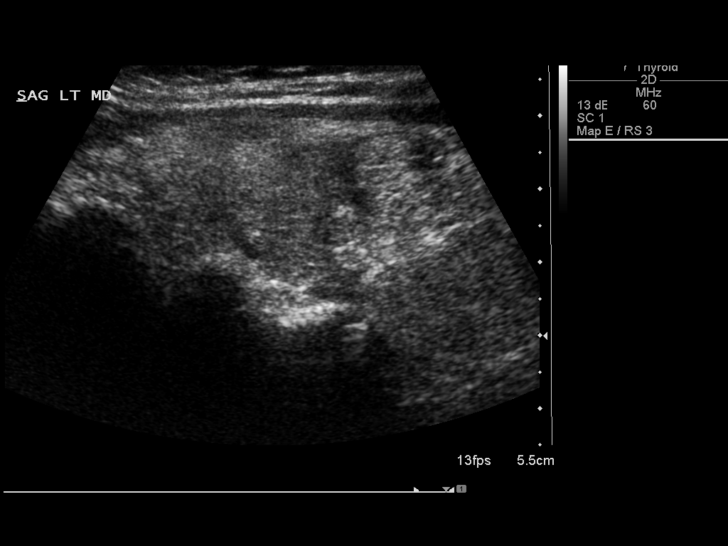
[im 52/57]
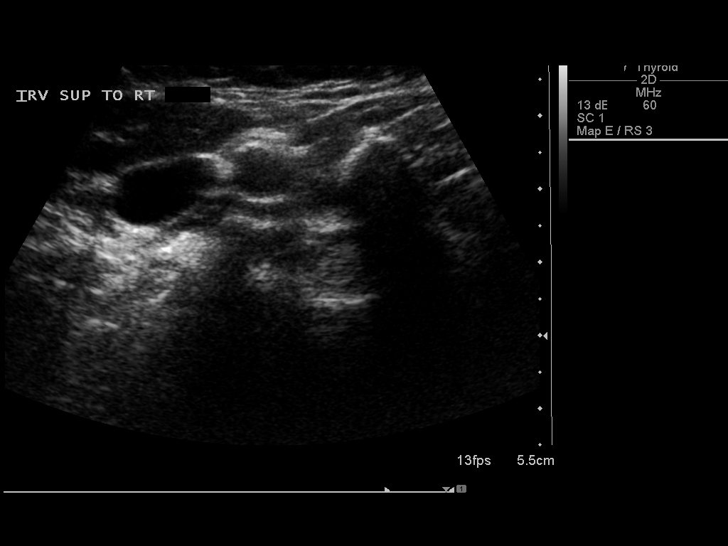
[im 57/57]
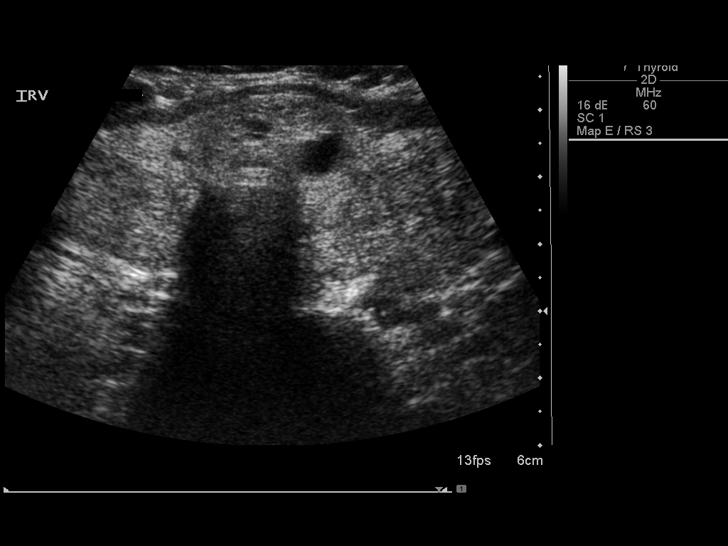

[14 of 25 positions shown; findings below may reference images not displayed]

FINDINGS: Right thyroid lobe

Measurements: 5.9 x 2.7 x 2.6 cm. Solid lower pole nodule measures
2.0 x 1.6 x 2.3 cm and previously measured 1.9 x 1.5 x 1.5 cm. 8 mm
upper pole nodule.

Left thyroid lobe

Measurements: 6.3 x 2.9 x 3.4 cm. Dominant left lower pole solid
nodule measures 3.1 x 4.4 x 2.7 cm and previously measured 3.4 x
x 2.7 cm.

Isthmus

Thickness: 1.6 cm. Solid nodule measures 2.7 x 1.6 x 2.8 cm and
previously measured 2.3 x 1.3 x 2.2 cm. Small adjacent nodule
measures 1.1 cm.

Lymphadenopathy

None visualized.
IMPRESSION: The large right, left, and isthmic nodules have all enlarged.
Consider sampling the dominant left lower pole nodule since all of
them have enlarged. Findings meet consensus criteria for biopsy.
Ultrasound-guided fine needle aspiration should be considered, as
per the consensus statement: Management of Thyroid Nodules Detected
at US: Society of Radiologists in Ultrasound Consensus Conference

## 2015-11-22 DIAGNOSIS — Y9342 Activity, yoga: Secondary | ICD-10-CM

## 2015-11-22 NOTE — Congregational Nurse Program (Signed)
Congregational Nurse Program Note  Date of Encounter: 11/22/2015  Past Medical History: Past Medical History:  Diagnosis Date  . Afib ([place])   . Arthritis    WRISTS AND NECK  . Benign positional vertigo   . Breast cancer ([place]) 2016   ER+/PR-/Her2-  . Colon polyps 12/2005  . Diabetes mellitus, type 2 ([place]) 06/2010  . Essential hypertension, benign   . Family history of hypertrophic cardiomyopathy   . Family history of ovarian cancer   . Family history of pancreatic cancer   . Focal nodular hyperplasia of liver    bx 2002  . Glaucoma   . Goiter 10/2006  . Hot flashes   . Hypothyroidism   . Migraines    menstrual  . Obesity   . OSA (obstructive sleep apnea)   . Other and unspecified hyperlipidemia     Encounter Details:     CNP Questionnaire - 11/22/15 1103      Patient Demographics   Is this a new or existing patient? Existing   Patient is considered a/an Not Applicable   Race African-American/Black     Patient Assistance   Location of Patient Assistance Not Applicable   Patient's financial/insurance status Private Insurance Coverage   Uninsured Patient No   Patient referred to apply for the following financial assistance Not Applicable   Food insecurities addressed Not Applicable   Transportation assistance No   Assistance securing medications No   Educational health offerings Exercise/physical activity     Encounter Details   Primary purpose of visit Education/Health Concerns   Was an Emergency Department visit averted? Not Applicable   Does patient have a medical provider? Yes   Patient referred to Not Applicable   Was a mental health screening completed? (GAINS tool) No   Does patient have dental issues? No   Does patient have vision issues? No   Does your patient have an abnormal blood pressure today? No   Since previous encounter, have you referred patient for abnormal blood pressure that resulted in a new diagnosis or medication change? No   Does your  patient have an abnormal blood glucose today? No   Since previous encounter, have you referred patient for abnormal blood glucose that resulted in a new diagnosis or medication change? No   Was there a life-saving intervention made? No    Attended chair yoga  Class.

## 2015-11-30 ENCOUNTER — Encounter: Payer: Self-pay | Admitting: Hematology and Oncology

## 2015-11-30 ENCOUNTER — Ambulatory Visit (HOSPITAL_BASED_OUTPATIENT_CLINIC_OR_DEPARTMENT_OTHER): Payer: 59 | Admitting: Hematology and Oncology

## 2015-11-30 DIAGNOSIS — Z170 Estrogen receptor positive status [ER+]: Secondary | ICD-10-CM | POA: Diagnosis not present

## 2015-11-30 DIAGNOSIS — Z79811 Long term (current) use of aromatase inhibitors: Secondary | ICD-10-CM

## 2015-11-30 DIAGNOSIS — C50411 Malignant neoplasm of upper-outer quadrant of right female breast: Secondary | ICD-10-CM | POA: Diagnosis not present

## 2015-11-30 MED ORDER — COLLAGEN HYDROLYSATE POWD: 1.0000 | Freq: Every day | 0 refills | Status: DC

## 2015-11-30 MED ORDER — B-12 1000 MCG SL SUBL: 1.0000 mL | SUBLINGUAL_TABLET | Freq: Every day | SUBLINGUAL | Status: DC

## 2015-11-30 NOTE — Progress Notes (Signed)
Patient Care Team: Gaynelle Arabian, MD as PCP - General (Family Medicine) Jacelyn Pi, MD (Endocrinology) Inda Castle, MD (Gastroenterology) Vania Rea, MD (Obstetrics and Gynecology) Excell Seltzer, MD as Consulting Physician (General Surgery) Nicholas Lose, MD as Consulting Physician (Hematology and Oncology) Eppie Gibson, MD as Attending Physician (Radiation Oncology) Mauro Kaufmann, RN as Registered Nurse Rockwell Germany, RN as Registered Nurse Holley Bouche, NP as Nurse Practitioner (Nurse Practitioner) Sylvan Cheese, NP as Nurse Practitioner (Nurse Practitioner)  DIAGNOSIS:  Encounter Diagnosis  Name Primary?  . Malignant neoplasm of upper-outer quadrant of right breast in female, estrogen receptor positive ([place])     SUMMARY OF ONCOLOGIC HISTORY:   Breast cancer of upper-outer quadrant of right female breast ([place])   09/08/2014 Mammogram    Right breast: irregular mass with calcifications at 11:00, posterior depth.      09/15/2014 Breast US    Right breast: 1.6 cm irregular mass with indistinct margin at 11:00, posterior depth, 6 CFN. Related microcalcifications. Hypoechoic, correlates with mammographic findings.      09/16/2014 Initial Biopsy    Right breast biopsy: Invasive ductal carcinoma, grade 2, ER+ 100%, PR- 0%, HER-2 negative, Ki-67 10%      09/16/2014 Clinical Stage    Stage IA: T1c N0      09/23/2014 Procedure    Breast/Ovarian panel ([facility]) reveals no clinically signficant variant at ATM, BARD1, BRCA1, BRCA2, BRIP1, CDH1, CHEK2, EPCAM, FANCC, MLH1, MSH2, MSH6, NBN, PALB2, PMS2, PTEN, RAD51C, RAD51D, TP53, and XRCC2.      11/25/2014 Definitive Surgery    Right lumpectomy: IDC 1.7 cm Grade 2 with DCIS, 0/2 LN, ER+ 100, PR- 0%, HER 2 Neg       11/25/2014 Pathologic Stage    Stage IA: T1c N0      11/25/2014 Oncotype testing    Oncotype DX 18, 11% ROR      12/23/2014 - 01/28/2015 Radiation Therapy    Adjuvant RT: Right breast /  50 Gy in 25 fractions      03/02/2015 -  Anti-estrogen oral therapy    Anastrozole 1 mg daily 5 years      03/31/2015 Survivorship    Survivorship visit completed and copy of care plan given to patient       CHIEF COMPLIANT: Follow-up on anastrozole therapy  INTERVAL HISTORY: [name] is a 60 year old with above-mentioned history of right breast cancer currently on anastrozole therapy. She has noticed increased muscles. Aches and pains but when she started taking collagen her symptoms have resolved. She is tolerating the treatment extremely well. She had a palpable nodularity at the site of surgery and radiation. This was evaluated by the mammogram recently in August 2017. The mammograms were normal.  REVIEW OF SYSTEMS:   Constitutional: Denies fevers, chills or abnormal weight loss Eyes: Denies blurriness of vision Ears, nose, mouth, throat, and face: Denies mucositis or sore throat Respiratory: Denies cough, dyspnea or wheezes Cardiovascular: Denies palpitation, chest discomfort Gastrointestinal:  Denies nausea, heartburn or change in bowel habits Skin: Denies abnormal skin rashes Lymphatics: Denies new lymphadenopathy or easy bruising Neurological:Denies numbness, tingling or new weaknesses Behavioral/Psych: Mood is stable, no new changes  Extremities: No lower extremity edema Breast: Palpable lump at the site of surgery related to scar tissue All other systems were reviewed with the patient and are negative.  I have reviewed the past medical history, past surgical history, social history and family history with the patient and they are unchanged from previous  note.  ALLERGIES:  is allergic to simvastatin.  MEDICATIONS:  Current Outpatient Prescriptions  Medication Sig Dispense Refill  . amLODipine (NORVASC) 10 MG tablet Take 1 tablet (10 mg total) by mouth daily. NO MORE REFILLS WITHOUT OFFICE VISIT - 2ND NOTICE 15 tablet 0  . anastrozole (ARIMIDEX) 1 MG tablet  Take 1 tablet (1 mg total) by mouth daily. 90 tablet 3  . Cholecalciferol (VITAMIN D-3) 1000 UNITS CAPS Take 1 capsule by mouth 3 (three) times a week.    . Coenzyme Q10 (CO Q 10) 100 MG CAPS Take by mouth daily.     Marland Kitchen levothyroxine (SYNTHROID, LEVOTHROID) 25 MCG tablet Take 25 mcg by mouth daily. NEED REFILLS    . LIVALO 1 MG TABS Take 1 tablet by mouth every other day.  0  . losartan-hydrochlorothiazide (HYZAAR) 100-12.5 MG per tablet Take 1 tablet by mouth daily. NO MORE REFILLS WITHOUT OFFICE VISIT - 2ND NOTICE 15 tablet 0  . metFORMIN (GLUCOPHAGE-XR) 500 MG 24 hr tablet Take 1 tablet (500 mg total) by mouth daily with breakfast. NO MORE REFILLS WITHOUT OFFICE VISIT - 2ND NOTICE 15 tablet 0  . metoprolol succinate (TOPROL-XL) 100 MG 24 hr tablet Take 1 tablet (100 mg total) by mouth daily. NO MORE REFILLS WITHOUT OFFICE VISIT - 2ND NOTICE 15 tablet 0  . Multiple Vitamins-Minerals (WOMENS 50+ MULTI VITAMIN/MIN) TABS Take by mouth daily.    . potassium chloride (K-DUR) 10 MEQ tablet Take 10 mEq by mouth daily.    Alveda Reasons 20 MG TABS tablet TAKE 1 TABLET (20 MG TOTAL) BY MOUTH DAILY. 30 tablet 1   No current facility-administered medications for this visit.     PHYSICAL EXAMINATION: ECOG PERFORMANCE STATUS: 1 - Symptomatic but completely ambulatory  Vitals:   11/30/15 0914  BP: 132/69  Pulse: 81  Resp: 18  Temp: 97.7 F (36.5 C)   Filed Weights   11/30/15 0914  Weight: 236 lb 9.6 oz (107.3 kg)    GENERAL:alert, no distress and comfortable SKIN: skin color, texture, turgor are normal, no rashes or significant lesions EYES: normal, Conjunctiva are pink and non-injected, sclera clear OROPHARYNX:no exudate, no erythema and lips, buccal mucosa, and tongue normal  NECK: supple, thyroid normal size, non-tender, without nodularity LYMPH:  no palpable lymphadenopathy in the cervical, axillary or inguinal LUNGS: clear to auscultation and percussion with normal breathing effort HEART:  regular rate & rhythm and no murmurs and no lower extremity edema ABDOMEN:abdomen soft, non-tender and normal bowel sounds MUSCULOSKELETAL:no cyanosis of digits and no clubbing  NEURO: alert & oriented x 3 with fluent speech, no focal motor/sensory deficits EXTREMITIES: No lower extremity edema BREAST:Palpable nodularity at the site of scar tissue from surgery. No palpable axillary supraclavicular or infraclavicular adenopathy no breast tenderness or nipple discharge. (exam performed in the presence of a chaperone)  LABORATORY DATA:  I have reviewed the data as listed   Chemistry      Component Value Date/Time   NA 138 11/19/2014 0920   NA 140 09/22/2014 1248   K 4.1 11/19/2014 0920   K 3.6 09/22/2014 1248   CL 100 (L) 11/19/2014 0920   CO2 29 11/19/2014 0920   CO2 29 09/22/2014 1248   BUN 9 11/19/2014 0920   BUN 11.9 09/22/2014 1248   CREATININE 0.89 11/19/2014 0920   CREATININE 1.1 09/22/2014 1248   GLU 127 10/10/2010      Component Value Date/Time   CALCIUM 9.0 11/19/2014 0920   CALCIUM 9.3 09/22/2014 1248  ALKPHOS 87 09/22/2014 1248   AST 37 (H) 09/22/2014 1248   ALT 61 (H) 09/22/2014 1248   BILITOT 0.64 09/22/2014 1248       Lab Results  Component Value Date   WBC 5.5 09/22/2014   HGB 12.6 11/25/2014   HCT 39.1 09/22/2014   MCV 83.9 09/22/2014   PLT 251 09/22/2014   NEUTROABS 2.7 09/22/2014     ASSESSMENT & PLAN:  Breast cancer of upper-outer quadrant of right female breast ([place]) Rt Lumpectomy 11/25/14: IDC 1.7 cm Grade 2 with DCIS , 0/2 LN, ER 100, PR 0%, Her 2 Neg T1CN0 (Stage 1A) Oncotype DX 18, 11% risk of recurrence Adjuvant radiation therapy 12/23/2014 to 01/28/2015  Current treatment: Adjuvant antiestrogen therapy with anastrozole 1 mg daily 5 years started 03/02/2015 Anastrozole toxicities: 1. Occasional hot flashes 2-3 times a week (attributes them to eating sweets ) Denies any arthralgias or myalgias.  Survivorship: Encouraged her to  continue to exercise and stay active. Surveillance for breast cancer:  1. Breast exam 11/30/2015: Benign scar tissue 2. mammograms at [facility]:  August 2017 was benign  Return to clinic in 6 months for follow-up   No orders of the defined types were placed in this encounter.  The patient has a good understanding of the overall plan. she agrees with it. she will call with any problems that may develop before the next visit here.   Rulon Eisenmenger, MD 11/30/15

## 2015-11-30 NOTE — Assessment & Plan Note (Signed)
Rt Lumpectomy 11/25/14: IDC 1.7 cm Grade 2 with DCIS , 0/2 LN, ER 100, PR 0%, Her 2 Neg T1CN0 (Stage 1A) Oncotype DX 18, 11% risk of recurrence Adjuvant radiation therapy 12/23/2014 to 01/28/2015  Current treatment: Adjuvant antiestrogen therapy with anastrozole 1 mg daily 5 years started 03/02/2015 Anastrozole toxicities: 1. Occasional hot flashes 2-3 times a week (attributes them to eating sweets ) Denies any arthralgias or myalgias.  Survivorship: Encouraged her to continue to exercise and stay active. Surveillance for breast cancer:  1. Breast exam 11/30/2015: No palpable abnormalities 2. mammograms at [facility]  Return to clinic in 6 months for follow-up

## 2015-12-08 ENCOUNTER — Encounter: Payer: Self-pay | Admitting: Internal Medicine

## 2015-12-21 ENCOUNTER — Encounter: Payer: Self-pay | Admitting: Gastroenterology

## 2015-12-21 ENCOUNTER — Ambulatory Visit (INDEPENDENT_AMBULATORY_CARE_PROVIDER_SITE_OTHER): Payer: 59 | Admitting: Internal Medicine

## 2015-12-21 ENCOUNTER — Encounter: Payer: Self-pay | Admitting: Internal Medicine

## 2015-12-21 ENCOUNTER — Telehealth: Payer: Self-pay | Admitting: *Deleted

## 2015-12-21 ENCOUNTER — Ambulatory Visit (INDEPENDENT_AMBULATORY_CARE_PROVIDER_SITE_OTHER): Payer: 59 | Admitting: Gastroenterology

## 2015-12-21 VITALS — BP 124/72 | HR 83 | Ht 66.0 in | Wt 221.6 lb

## 2015-12-21 VITALS — BP 116/74 | HR 80 | Ht 65.0 in | Wt 221.1 lb

## 2015-12-21 DIAGNOSIS — Z1211 Encounter for screening for malignant neoplasm of colon: Secondary | ICD-10-CM

## 2015-12-21 DIAGNOSIS — R140 Abdominal distension (gaseous): Secondary | ICD-10-CM | POA: Diagnosis not present

## 2015-12-21 DIAGNOSIS — Z8249 Family history of ischemic heart disease and other diseases of the circulatory system: Secondary | ICD-10-CM

## 2015-12-21 DIAGNOSIS — R143 Flatulence: Secondary | ICD-10-CM

## 2015-12-21 DIAGNOSIS — I480 Paroxysmal atrial fibrillation: Secondary | ICD-10-CM

## 2015-12-21 MED ORDER — NA SULFATE-K SULFATE-MG SULF 17.5-3.13-1.6 GM/177ML PO SOLN: 1.0000 | Freq: Once | ORAL | 0 refills | Status: AC

## 2015-12-21 MED ORDER — RIVAROXABAN 20 MG PO TABS: ORAL_TABLET | ORAL | 11 refills | Status: DC

## 2015-12-21 NOTE — Patient Instructions (Addendum)
You have been scheduled for a colonoscopy. Please follow written instructions given to you at your visit today.  Please pick up your prep supplies at the pharmacy within the next 1-3 days. If you use inhalers (even only as needed), please bring them with you on the day of your procedure. Your physician has requested that you go to www.startemmi.com and enter the access code given to you at your visit today. This web site gives a general overview about your procedure. However, you should still follow specific instructions given to you by our office regarding your preparation for the procedure.  Use Align once daily  Use Phezyme 1 capsule three times daily as needed   We will contact you about holding blood thinner

## 2015-12-21 NOTE — Telephone Encounter (Signed)
Needs to stop 48 hrs  Prior to procedure

## 2015-12-21 NOTE — Telephone Encounter (Signed)
  12/21/2015   RE: Jessica Mendoza DOB: [birth year] MRN: [id number]   Dear Caryl Comes,    We have scheduled the above patient for an endoscopic procedure. Our records show that she is on anticoagulation therapy.   Please advise as to how long the patient may come off her therapy of Xarelto prior to the procedure, which is scheduled for 01/11/2016.  Please fax back/ or route the completed form to [facility] at [phone].   Sincerely,    Tonita Phoenix AAMA

## 2015-12-21 NOTE — Progress Notes (Signed)
Jessica Mendoza    [id number]    1955-02-15  Primary Care Physician:EHINGER,ROBERT R, MD  Referring Physician: Blair Heys, MD [address] [place], [place] [address]  Chief complaint:  Colorectal cancer screening HPI:  60 year old female with history of A. fib on Xarelto here to discuss screening colonoscopy. Last colonoscopy in 2007 was unremarkable, a 3 mm hyperplastic polyp was removed from sigmoid colon.Denies any nausea, vomiting, abdominal pain, change in bowel habits, melena or bright red blood per rectum. On review of systems patient complains of bloating and excessive flatus, worse with certain foods beans and beef make it worse. She is on CPAP at bedtime.     Outpatient Encounter Prescriptions as of 12/21/2015  Medication Sig  . amLODipine (NORVASC) 10 MG tablet Take 1 tablet (10 mg total) by mouth daily. NO MORE REFILLS WITHOUT OFFICE VISIT - 2ND NOTICE  . anastrozole (ARIMIDEX) 1 MG tablet Take 1 tablet (1 mg total) by mouth daily.  . Cholecalciferol (VITAMIN D-3) 1000 UNITS CAPS Take 1 capsule by mouth 3 (three) times a week.  . Coenzyme Q10 (CO Q 10) 100 MG CAPS Take by mouth daily.   . Collagen Hydrolysate POWD 1 scoop by Does not apply route daily.  . Cyanocobalamin (B-12) 1000 MCG SUBL Place 1 mL under the tongue daily.  Marland Kitchen levothyroxine (SYNTHROID, LEVOTHROID) 25 MCG tablet Take 25 mcg by mouth daily. NEED REFILLS  . LIVALO 1 MG TABS Take 1 tablet by mouth every other day.  . losartan-hydrochlorothiazide (HYZAAR) 100-12.5 MG per tablet Take 1 tablet by mouth daily. NO MORE REFILLS WITHOUT OFFICE VISIT - 2ND NOTICE  . metFORMIN (GLUCOPHAGE-XR) 500 MG 24 hr tablet Take 1 tablet (500 mg total) by mouth daily with breakfast. NO MORE REFILLS WITHOUT OFFICE VISIT - 2ND NOTICE  . metoprolol succinate (TOPROL-XL) 100 MG 24 hr tablet Take 1 tablet (100 mg total) by mouth daily. NO MORE REFILLS WITHOUT OFFICE VISIT - 2ND NOTICE  . NON FORMULARY  5,000 mg daily. Biotin  . NON FORMULARY 50 mg daily. Zinc  . potassium chloride (K-DUR) 10 MEQ tablet Take 10 mEq by mouth daily.  Carlena Hurl 20 MG TABS tablet TAKE 1 TABLET (20 MG TOTAL) BY MOUTH DAILY.   No facility-administered encounter medications on file as of 12/21/2015.     Allergies as of 12/21/2015 - Review Complete 12/21/2015  Allergen Reaction Noted  . Simvastatin  04/05/2011    Past Medical History:  Diagnosis Date  . Afib (H[facility])   . Arthritis    WRISTS AND NECK  . Benign positional vertigo   . Bloating   . Breast cancer ([facility]) 2016   ER+/PR-/Her2-  . Colon polyps 12/2005  . Diabetes mellitus, type 2 (H[facility]) 06/2010  . Essential hypertension, benign   . Family history of hypertrophic cardiomyopathy   . Family history of ovarian cancer   . Family history of pancreatic cancer   . Focal nodular hyperplasia of liver    bx 2002  . Glaucoma   . Goiter 10/2006  . Hot flashes   . Hypothyroidism   . Migraines    menstrual  . Obesity   . OSA (obstructive sleep apnea)   . Other and unspecified hyperlipidemia   . Sleep apnea     Past Surgical History:  Procedure Laterality Date  . BREAST LUMPECTOMY     right breast- benign  . BREAST LUMPECTOMY WITH RADIOACTIVE SEED AND SENTINEL  LYMPH NODE BIOPSY Right 11/25/2014   Procedure: RIGHT BREAST LUMPECTOMY WITH RADIOACTIVE SEED AND RIGHT AXILLARY SENTINEL LYMPH NODE BIOPSY;  Surgeon: Glenna Fellows, MD;  Location: [facility];  Service: General;  Laterality: Right;  . CARDIOVERSION N/A 07/11/2012   Procedure: CARDIOVERSION;  Surgeon: Laurey Morale, MD;  Location: [facility] ENDOSCOPY;  Service: Cardiovascular;  Laterality: N/A;  . LIVER BIOPSY  2002    Family History  Problem Relation Age of Onset  . Hypertension Mother   . Heart attack Mother   . Alcohol abuse Father   . Hypertension Brother   . Hyperlipidemia Brother   . Ovarian cancer Maternal Aunt 56  . Pancreatic cancer Maternal Grandfather 25  . Breast  cancer Cousin 35    Mother's paternal first cousin, had breast cancer again at 32  . Ovarian cancer Cousin 67    "groin cancer"  . Hypertension Paternal Grandmother   . Diabetes Paternal Grandmother   . Liver cancer Paternal Aunt 84    ETOH related  . Colon cancer Neg Hx   . Stomach cancer Neg Hx   . Rectal cancer Neg Hx   . Esophageal cancer Neg Hx     Social History   Social History  . Marital status: Married    Spouse name: Cephas  . Number of children: 1  . Years of education: N/A   Occupational History  . application reviewer - part-time    Social History Main Topics  . Smoking status: Former Smoker    Packs/day: 0.50    Years: 25.00    Types: Cigarettes    Quit date: 02/13/1988  . Smokeless tobacco: Never Used  . Alcohol use Yes     Comment: occas  . Drug use: No  . Sexual activity: Yes    Birth control/ protection: None   Other Topics Concern  . Not on file   Social History Narrative  . No narrative on file      Review of systems: Review of Systems  Constitutional: Negative for fever and chills.  HENT: Negative.   Eyes: Negative for blurred vision.  Respiratory: Negative for cough, shortness of breath and wheezing.   Cardiovascular: Negative for chest pain and palpitations.  Gastrointestinal: as per HPI Genitourinary: Negative for dysuria, urgency, frequency and hematuria.  Musculoskeletal: Positive for myalgias, back pain and joint pain.  Skin: Negative for itching and rash.  Neurological: Negative for dizziness, tremors, focal weakness, seizures and loss of consciousness.  Endo/Heme/Allergies: Negative for seasonal allergies.  Psychiatric/Behavioral: Negative for depression, suicidal ideas and hallucinations.  All other systems reviewed and are negative.   Physical Exam: Vitals:   12/21/15 0819  BP: 116/74  Pulse: 80   Body mass index is 36.8 kg/m. Gen:      No acute distress HEENT:  EOMI, sclera anicteric Neck:     No masses; no  thyromegaly Lungs:    Clear to auscultation bilaterally; normal respiratory effort CV:         Regular rate and rhythm; no murmurs Abd:      + bowel sounds; soft, non-tender; no palpable masses, no distension Ext:    No edema; adequate peripheral perfusion Skin:      Warm and dry; no rash Neuro: alert and oriented x 3 Psych: normal mood and affect  Data Reviewed:  Reviewed labs, radiology imaging, old records and pertinent past GI work up   Assessment and Plan/Recommendations: 31 yr F with h/o OSA on CPAP, Afib on Xarelto  is here to discuss screening colonoscopy The risks and benefits as well as alternatives of endoscopic procedure(s) have been discussed and reviewed. All questions answered. The patient agrees to proceed. Will discuss with Cardiology if ok to hold Xarelto 2 days prior to the procedure Bloating and excessive flatus: Start Probiotic Align 1 capsule daily Phazyme 1 capsule TID PRN Greater than 50% of the time used for counseling as well as treatment plan and follow-up. She had multiple questions which were answered to her satisfaction  K. Scherry Ran , MD [phone] Mon-Fri 8a-5p [phone] after 5p, weekends, holidays  CC: Blair Heys, MD

## 2015-12-21 NOTE — Progress Notes (Signed)
She is in skf      Patient Care Team: Gaynelle Arabian, MD as PCP - General (Family Medicine) Jacelyn Pi, MD (Endocrinology) Inda Castle, MD (Gastroenterology) Vania Rea, MD (Obstetrics and Gynecology) Excell Seltzer, MD as Consulting Physician (General Surgery) Nicholas Lose, MD as Consulting Physician (Hematology and Oncology) Eppie Gibson, MD as Attending Physician (Radiation Oncology) Mauro Kaufmann, RN as Registered Nurse Rockwell Germany, RN as Registered Nurse Holley Bouche, NP as Nurse Practitioner (Nurse Practitioner) Sylvan Cheese, NP as Nurse Practitioner (Nurse Practitioner)   HPI  Jessica Mendoza is a 60 y.o. female Seen in followup for atrial fibrillation associated with exercise intolerance. She had a CHADS2 score of 2 and a CHADS-VASc score of 3 and is on Rivaroxaban;  Temp assoc with tongue bleeding    She has had no symptomatic atrial fibrillation; she underwent cardioversion 5/14  Interval Myoview was normal  AHI was 17 on sleep study   There is a family history of HCM although her echocardiogram doesn't demonstrate any evidence of LVH.  She is better in sinus rhythm with improved exercise tolerance.    Past Medical History:  Diagnosis Date  . Afib ([place])   . Arthritis    WRISTS AND NECK  . Benign positional vertigo   . Bloating   . Breast cancer ([place]) 2016   ER+/PR-/Her2-  . Colon polyps 12/2005  . Diabetes mellitus, type 2 ([place]) 06/2010  . Essential hypertension, benign   . Family history of hypertrophic cardiomyopathy   . Family history of ovarian cancer   . Family history of pancreatic cancer   . Focal nodular hyperplasia of liver    bx 2002  . Glaucoma   . Goiter 10/2006  . Hot flashes   . Hypothyroidism   . Migraines    menstrual  . Obesity   . OSA (obstructive sleep apnea)   . Other and unspecified hyperlipidemia   . Sleep apnea     Past Surgical History:  Procedure Laterality Date  . BREAST  LUMPECTOMY     right breast- benign  . BREAST LUMPECTOMY WITH RADIOACTIVE SEED AND SENTINEL LYMPH NODE BIOPSY Right 11/25/2014   Procedure: RIGHT BREAST LUMPECTOMY WITH RADIOACTIVE SEED AND RIGHT AXILLARY SENTINEL LYMPH NODE BIOPSY;  Surgeon: Excell Seltzer, MD;  Location: [facility];  Service: General;  Laterality: Right;  . CARDIOVERSION N/A 07/11/2012   Procedure: CARDIOVERSION;  Surgeon: Larey Dresser, MD;  Location: [facility];  Service: Cardiovascular;  Laterality: N/A;  . LIVER BIOPSY  2002    Current Outpatient Prescriptions  Medication Sig Dispense Refill  . amLODipine (NORVASC) 10 MG tablet Take 1 tablet (10 mg total) by mouth daily. NO MORE REFILLS WITHOUT OFFICE VISIT - 2ND NOTICE 15 tablet 0  . anastrozole (ARIMIDEX) 1 MG tablet Take 1 tablet (1 mg total) by mouth daily. 90 tablet 3  . Cholecalciferol (VITAMIN D-3) 1000 UNITS CAPS Take 1 capsule by mouth 3 (three) times a week.    . Coenzyme Q10 (CO Q 10) 100 MG CAPS Take by mouth daily.     . Collagen Hydrolysate POWD 1 scoop by Does not apply route daily.  0  . Cyanocobalamin (B-12) 1000 MCG SUBL Place 1 mL under the tongue daily. 30 each   . levothyroxine (SYNTHROID, LEVOTHROID) 25 MCG tablet Take 25 mcg by mouth daily. NEED REFILLS    . LIVALO 1 MG TABS Take 1 tablet by mouth every other day.  0  .  losartan-hydrochlorothiazide (HYZAAR) 100-12.5 MG per tablet Take 1 tablet by mouth daily. NO MORE REFILLS WITHOUT OFFICE VISIT - 2ND NOTICE 15 tablet 0  . metFORMIN (GLUCOPHAGE-XR) 500 MG 24 hr tablet Take 1 tablet (500 mg total) by mouth daily with breakfast. NO MORE REFILLS WITHOUT OFFICE VISIT - 2ND NOTICE 15 tablet 0  . metoprolol succinate (TOPROL-XL) 100 MG 24 hr tablet Take 1 tablet (100 mg total) by mouth daily. NO MORE REFILLS WITHOUT OFFICE VISIT - 2ND NOTICE 15 tablet 0  . Na Sulfate-K Sulfate-Mg Sulf (SUPREP BOWEL PREP KIT) 17.5-3.13-1.6 GM/180ML SOLN Take 1 kit by mouth once. 1 Bottle 0  . NON  FORMULARY 5,000 mg daily. Biotin    . NON FORMULARY 50 mg daily. Zinc    . potassium chloride (K-DUR) 10 MEQ tablet Take 10 mEq by mouth daily.    Alveda Reasons 20 MG TABS tablet TAKE 1 TABLET (20 MG TOTAL) BY MOUTH DAILY. 30 tablet 1   No current facility-administered medications for this visit.     Allergies  Allergen Reactions  . Simvastatin     Myalgias  ?? lipitor     Review of Systems negative except from HPI and PMH  Physical Exam BP 124/72   Pulse 83   Ht 5' 6" (1.676 m)   Wt 221 lb 9.6 oz (100.5 kg)   SpO2 95%   BMI 35.77 kg/m  Well developed and well nourished in no acute distress HENT normal E scleral and icterus clear Neck Supple JVP flat; carotids brisk and full Clear to ausculation  *Regular rate and rhythm, no murmurs gallops or rub Soft with active bowel sounds No clubbing cyanosis none Edema Alert and oriented, grossly normal motor and sensory function Skin Warm and Dry  ECG demonstrates sinus rhythm at 72 intervals 19/07/40  R prime in lead V1  Assessment and  Plan  Hypertension well controlled   Atrial fibrillation-paroxysmal no symptomatic recurrences  We had a lengthy discussion regarding anticoagulation and the risks and benefits. she is currently on Rivaroxaban   Obesity As above  Family history of HCM     tHer brother did not get screened. We will undertake [place] surveillance echo or her daughter.  She is anticipating colonoscopy. We will need although Xarelto for 2 doses

## 2015-12-21 NOTE — Patient Instructions (Signed)
Medication Instructions: Your physician recommends that you continue on your current medications as directed. Please refer to the Current Medication list given to you today.   Labwork: None ordered   Procedures/Testing: None ordered   Follow-Up: Your physician wants you to follow-up in: 12 months with Dr Gari Crown will receive a reminder letter in the mail two months in advance. If you don't receive a letter, please call our office to schedule the follow-up appointment.   Okay to hold Xarelto for 48 hours prior to colonoscopy    Patient is going to call back if daughter needs echo here and speak with Mckenzi Buonomo--Dx: HCM

## 2015-12-22 ENCOUNTER — Other Ambulatory Visit: Payer: Self-pay | Admitting: Internal Medicine

## 2015-12-22 NOTE — Telephone Encounter (Signed)
OK to hold xarelto 48 hours prior to procedure pt aware

## 2015-12-29 ENCOUNTER — Encounter: Payer: Self-pay | Admitting: Gastroenterology

## 2015-12-31 NOTE — Congregational Nurse Program (Signed)
Congregational Nurse Program Note  Date of Encounter: 12/31/2015  Past Medical History: Past Medical History:  Diagnosis Date  . Afib ([place])   . Arthritis    WRISTS AND NECK  . Benign positional vertigo   . Bloating   . Breast cancer ([place]) 2016   ER+/PR-/Her2-  . Colon polyps 12/2005  . Diabetes mellitus, type 2 ([place]) 06/2010  . Essential hypertension, benign   . Family history of hypertrophic cardiomyopathy   . Family history of ovarian cancer   . Family history of pancreatic cancer   . Focal nodular hyperplasia of liver    bx 2002  . Glaucoma   . Goiter 10/2006  . Hot flashes   . Hypothyroidism   . Migraines    menstrual  . Obesity   . OSA (obstructive sleep apnea)   . Other and unspecified hyperlipidemia   . Sleep apnea     Encounter Details:     CNP Questionnaire - 12/31/15 1257      Patient Demographics   Is this a new or existing patient? Existing   Patient is considered a/an Not Applicable   Race African-American/Black     Patient Assistance   Location of Patient Assistance Not Applicable   Patient's financial/insurance status Private Insurance Coverage   Uninsured Patient ([place] Card/Care Connects) No   Patient referred to apply for the following financial assistance Not Applicable   Food insecurities addressed Not Applicable   Transportation assistance No   Assistance securing medications No   Educational health offerings Exercise/physical activity     Encounter Details   Primary purpose of visit Education/Health Concerns   Was an Emergency Department visit averted? Not Applicable   Does patient have a medical provider? Yes   Patient referred to Not Applicable   Was a mental health screening completed? (GAINS tool) No   Does patient have dental issues? No   Does patient have vision issues? No   Does your patient have an abnormal blood pressure today? No   Since previous encounter, have you referred patient for abnormal blood pressure that  resulted in a new diagnosis or medication change? No   Does your patient have an abnormal blood glucose today? No   Since previous encounter, have you referred patient for abnormal blood glucose that resulted in a new diagnosis or medication change? No   Was there a life-saving intervention made? No     Attended chair yoga class.

## 2016-01-11 ENCOUNTER — Ambulatory Visit (AMBULATORY_SURGERY_CENTER): Payer: 59 | Admitting: Internal Medicine

## 2016-01-11 ENCOUNTER — Encounter: Payer: Self-pay | Admitting: Gastroenterology

## 2016-01-11 VITALS — BP 126/66 | HR 73 | Temp 97.1°F | Resp 14 | Ht 65.0 in | Wt 221.0 lb

## 2016-01-11 DIAGNOSIS — Z1211 Encounter for screening for malignant neoplasm of colon: Secondary | ICD-10-CM | POA: Diagnosis present

## 2016-01-11 DIAGNOSIS — Z1212 Encounter for screening for malignant neoplasm of rectum: Secondary | ICD-10-CM | POA: Diagnosis not present

## 2016-01-11 LAB — GLUCOSE, CAPILLARY
Glucose-Capillary: 182 mg/dL — ABNORMAL HIGH (ref 65–99)
Glucose-Capillary: 152 mg/dL — ABNORMAL HIGH (ref 65–99)

## 2016-01-11 MED ORDER — SODIUM CHLORIDE 0.9 % IV SOLN: 500.0000 mL | INTRAVENOUS | Status: DC

## 2016-01-11 NOTE — Op Note (Signed)
[facility] Patient Name: Jessica Mendoza Procedure Date: 01/11/2016 10:14 AM MRN: [id number] Endoscopist: Gatha Mayer , MD Age: 60 Referring MD:  Date of Birth: 03-Dec-1955 Gender: Female Account #: [id number] Procedure:                Colonoscopy Indications:              Screening for colorectal malignant neoplasm Medicines:                Propofol per Anesthesia, Monitored Anesthesia Care Procedure:                Pre-Anesthesia Assessment:                           - Prior to the procedure, a History and Physical                            was performed, and patient medications and                            allergies were reviewed. The patient's tolerance of                            previous anesthesia was also reviewed. The risks                            and benefits of the procedure and the sedation                            options and risks were discussed with the patient.                            All questions were answered, and informed consent                            was obtained. Prior Anticoagulants: The patient                            last took Xarelto (rivaroxaban) 2 days prior to the                            procedure. ASA Grade Assessment: III - A patient                            with severe systemic disease. After reviewing the                            risks and benefits, the patient was deemed in                            satisfactory condition to undergo the procedure.                           After obtaining informed consent, the colonoscope  was passed under direct vision. Throughout the                            procedure, the patient's blood pressure, pulse, and                            oxygen saturations were monitored continuously. The                            Model CF-HQ190L [id number]) scope was introduced                            through the anus and advanced to the the cecum,                   identified by appendiceal orifice and ileocecal                            valve. The ileocecal valve, appendiceal orifice,                            and rectum were photographed. The quality of the                            bowel preparation was excellent. The bowel                            preparation used was Prepopik. Scope In: 10:19:26 AM Scope Out: 10:27:40 AM Scope Withdrawal Time: 0 hours 6 minutes 26 seconds  Total Procedure Duration: 0 hours 8 minutes 14 seconds  Findings:                 The perianal and digital rectal examinations were                            normal.                           A few small-mouthed diverticula were found in the                            sigmoid colon. There was no evidence of                            diverticular bleeding.                           The exam was otherwise without abnormality on                            direct and retroflexion views. Complications:            No immediate complications. Estimated blood loss:                            None. Estimated Blood Loss:     Estimated blood loss: none. Recommendation:           -  Repeat colonoscopy in 10 years for screening                            purposes.                           - Resume previous diet.                           - Resume Coumadin (warfarin) today at prior dose.                           - Continue present medications. Gatha Mayer, MD 01/11/2016 10:31:28 AM This report has been signed electronically.

## 2016-01-11 NOTE — Patient Instructions (Addendum)
Mo polyps or cancer seen. You do have mild diverticulosis - thickened muscle rings and pouches in the colon wall. Please read the handout about this condition.  Next routine colonoscopy in 10 years - 2027  I appreciate the opportunity to care for you. Gatha Mayer, MD, FACG  YOU HAD AN ENDOSCOPIC PROCEDURE TODAY AT [facility]:   Refer to the procedure report that was given to you for any specific questions about what was found during the examination.  If the procedure report does not answer your questions, please call your gastroenterologist to clarify.  If you requested that your care partner not be given the details of your procedure findings, then the procedure report has been included in a sealed envelope for you to review at your convenience later.  YOU SHOULD EXPECT: Some feelings of bloating in the abdomen. Passage of more gas than usual.  Walking can help get rid of the air that was put into your GI tract during the procedure and reduce the bloating. If you had a lower endoscopy (such as a colonoscopy or flexible sigmoidoscopy) you may notice spotting of blood in your stool or on the toilet paper. If you underwent a bowel prep for your procedure, you may not have a normal bowel movement for a few days.  Please Note:  You might notice some irritation and congestion in your nose or some drainage.  This is from the oxygen used during your procedure.  There is no need for concern and it should clear up in a day or so.  SYMPTOMS TO REPORT IMMEDIATELY:   Following lower endoscopy (colonoscopy or flexible sigmoidoscopy):  Excessive amounts of blood in the stool  Significant tenderness or worsening of abdominal pains  Swelling of the abdomen that is new, acute  Fever of 100F or higher   For urgent or emergent issues, a gastroenterologist can be reached at any hour by calling [phone].  Please read all handouts given to you by your recovery nurse  today.   DIET:  We do recommend a small meal at first, but then you may proceed to your regular diet.  Drink plenty of fluids but you should avoid alcoholic beverages for 24 hours.  ACTIVITY:  You should plan to take it easy for the rest of today and you should NOT DRIVE or use heavy machinery until tomorrow (because of the sedation medicines used during the test).    FOLLOW UP: Our staff will call the number listed on your records the next business day following your procedure to check on you and address any questions or concerns that you may have regarding the information given to you following your procedure. If we do not reach you, we will leave a message.  However, if you are feeling well and you are not experiencing any problems, there is no need to return our call.  We will assume that you have returned to your regular daily activities without incident.  If any biopsies were taken you will be contacted by phone or by letter within the next 1-3 weeks.  Please call us at [phone] if you have not heard about the biopsies in 3 weeks.    SIGNATURES/CONFIDENTIALITY: You and/or your care partner have signed paperwork which will be entered into your electronic medical record.  These signatures attest to the fact that that the information above on your After Visit Summary has been reviewed and is understood.  Full responsibility of the confidentiality of  this discharge information lies with you and/or your care-partner.  Thank you for letting us take care of your healthcare needs today.

## 2016-01-11 NOTE — Progress Notes (Signed)
Report to PACU, RN, vss, BBS= Clear.  

## 2016-01-12 ENCOUNTER — Telehealth: Payer: Self-pay | Admitting: *Deleted

## 2016-01-12 NOTE — Telephone Encounter (Signed)
  Follow up Call-  Call back number 01/11/2016  Post procedure Call Back phone  # [phone]  Permission to leave phone message Yes  Some recent data might be hidden     Patient questions:  Do you have a fever, pain , or abdominal swelling? No. Pain Score  0 *  Have you tolerated food without any problems? Yes.    Have you been able to return to your normal activities? Yes.    Do you have any questions about your discharge instructions: Diet   No. Medications  No. Follow up visit  No.  Do you have questions or concerns about your Care? No.  Actions: * If pain score is 4 or above: No action needed, pain <4.

## 2016-01-13 ENCOUNTER — Encounter: Payer: 59 | Admitting: Gastroenterology

## 2016-01-17 ENCOUNTER — Other Ambulatory Visit: Payer: Self-pay

## 2016-01-17 MED ORDER — ANASTROZOLE 1 MG PO TABS: 1.0000 mg | ORAL_TABLET | Freq: Every day | ORAL | 3 refills | Status: DC

## 2016-01-18 NOTE — Congregational Nurse Program (Signed)
Congregational Nurse Program Note  Date of Encounter: 01/18/2016  Past Medical History: Past Medical History:  Diagnosis Date  . Afib ([place])   . Arthritis    WRISTS AND NECK  . Benign positional vertigo   . Bloating   . Breast cancer ([place]) 2016   ER+/PR-/Her2-  . Colon polyps 12/2005  . Diabetes mellitus, type 2 ([place]) 06/2010  . Essential hypertension, benign   . Family history of hypertrophic cardiomyopathy   . Family history of ovarian cancer   . Family history of pancreatic cancer   . Focal nodular hyperplasia of liver    bx 2002  . Glaucoma   . Goiter 10/2006  . Hot flashes   . Hypothyroidism   . Migraines    menstrual  . Obesity   . OSA (obstructive sleep apnea)   . Other and unspecified hyperlipidemia   . Sleep apnea     Encounter Details:     CNP Questionnaire - 01/18/16 1500      Patient Demographics   Is this a new or existing patient? Existing   Patient is considered a/an Not Applicable   Race African-American/Black     Patient Assistance   Location of Patient Assistance Not Applicable   Patient's financial/insurance status Private Insurance Coverage   Uninsured Patient ([place] Card/Care Connects) No   Patient referred to apply for the following financial assistance Not Applicable   Food insecurities addressed Not Applicable   Transportation assistance No   Assistance securing medications No   Educational health offerings Exercise/physical activity     Encounter Details   Primary purpose of visit Education/Health Concerns   Was an Emergency Department visit averted? Not Applicable   Does patient have a medical provider? Yes   Patient referred to Not Applicable   Was a mental health screening completed? (GAINS tool) No   Does patient have dental issues? No   Does patient have vision issues? No   Does your patient have an abnormal blood pressure today? No   Since previous encounter, have you referred patient for abnormal blood pressure that  resulted in a new diagnosis or medication change? No   Does your patient have an abnormal blood glucose today? No   Since previous encounter, have you referred patient for abnormal blood glucose that resulted in a new diagnosis or medication change? No   Was there a life-saving intervention made? No    Attended chair yoga class.

## 2016-02-02 ENCOUNTER — Ambulatory Visit (INDEPENDENT_AMBULATORY_CARE_PROVIDER_SITE_OTHER): Payer: 59 | Admitting: Family Medicine

## 2016-02-02 VITALS — BP 112/70 | HR 75 | Temp 98.7°F | Ht 66.0 in | Wt 219.0 lb

## 2016-02-02 DIAGNOSIS — R0981 Nasal congestion: Secondary | ICD-10-CM

## 2016-02-02 DIAGNOSIS — R519 Headache, unspecified: Secondary | ICD-10-CM

## 2016-02-02 DIAGNOSIS — R51 Headache: Secondary | ICD-10-CM

## 2016-02-02 MED ORDER — IPRATROPIUM BROMIDE 0.03 % NA SOLN: 2.0000 | Freq: Two times a day (BID) | NASAL | 0 refills | Status: DC

## 2016-02-02 MED ORDER — PREDNISONE 10 MG PO TABS: 30.0000 mg | ORAL_TABLET | Freq: Every day | ORAL | 0 refills | Status: AC

## 2016-02-02 MED ORDER — PREDNISONE 10 MG PO TABS: 30.0000 mg | ORAL_TABLET | Freq: Every day | ORAL | 0 refills | Status: DC

## 2016-02-02 NOTE — Progress Notes (Signed)
Patient ID: Jessica Mendoza, female    DOB: [birth year], 60 y.o.   MRN: [id number]  PCP: Thora Lance, MD  Chief Complaint  Patient presents with  . Cough    productive - pale yellow x 2 days - started last week w/sore throat  . Nasal Congestion  . Fatigue    Subjective:  HPI Jessica Mendoza, 60 year old female, presents for evaluation of cough, nasal congestion, and fatigue. Reports illness started 1 week prior with a sore throat that has since resolved. She subsequently began to develop cough (occasionally productive of pale colored sputum), nasal congestion, and frontal headache. Denies fever, nausea, or vomiting. She hasn't taken any medication and reports just feeling poorly.  Chief Complaint  Patient presents with  . Cough    productive - pale yellow x 2 days - started last week w/sore throat  . Nasal Congestion  . Fatigue     Social History   Social History  . Marital status: Married    Spouse name: Cephas  . Number of children: 1  . Years of education: N/A   Occupational History  . application reviewer - part-time    Social History Main Topics  . Smoking status: Former Smoker    Packs/day: 0.50    Years: 25.00    Types: Cigarettes    Quit date: 02/13/1988  . Smokeless tobacco: Never Used  . Alcohol use Yes     Comment: occas  . Drug use: No  . Sexual activity: Yes    Birth control/ protection: None   Other Topics Concern  . Not on file   Social History Narrative  . No narrative on file    Family History  Problem Relation Age of Onset  . Hypertension Mother   . Heart attack Mother   . Alcohol abuse Father   . Hypertension Brother   . Hyperlipidemia Brother   . Ovarian cancer Maternal Aunt 56  . Pancreatic cancer Maternal Grandfather 3  . Breast cancer Cousin 76    Mother's paternal first cousin, had breast cancer again at 87  . Ovarian cancer Cousin 67    "groin cancer"  . Hypertension Paternal Grandmother   . Diabetes Paternal Grandmother     . Liver cancer Paternal Aunt 4    ETOH related  . Colon cancer Neg Hx   . Stomach cancer Neg Hx   . Rectal cancer Neg Hx   . Esophageal cancer Neg Hx    Review of Systems See HPI Patient Active Problem List   Diagnosis Date Noted  . Genetic testing 09/30/2014  . Family history of ovarian cancer   . Family history of pancreatic cancer   . Breast cancer of upper-outer quadrant of right female breast (HCC) 09/22/2014  . Genital herpes simplex 07/08/2013  . Obesity 07/08/2013  . Lichen sclerosus et atrophicus 07/08/2013  . Hypopigmented skin lesion 07/08/2013  . History of hypothyroidism 07/08/2013  . History of hypertension 07/08/2013  . Obstructive sleep apnea 07/02/2012  . Chest pain, exertional 07/02/2012  . Atrial fibrillation (HCC) 07/02/2012  . Hypothyroidism   . 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 Essential hypertension, benign   . Other and unspecified hyperlipidemia   . Migraines   . Anemia   . Benign positional vertigo   . Diabetes mellitus, type 2 ([facility]) 06/13/2010  . Goiter 10/14/2006  . Colon polyps 12/13/2005    Allergies  Allergen Reactions  . Simvastatin     Myalgias  ?? lipitor  Prior to Admission medications   Medication Sig Start Date End Date Taking? Authorizing Provider  amLODipine (NORVASC) 10 MG tablet Take 1 tablet (10 mg total) by mouth daily. NO MORE REFILLS WITHOUT OFFICE VISIT - 2ND NOTICE Patient taking differently: Take 10 mg by mouth daily.  05/11/14  Yes Collie Siad English, PA  anastrozole (ARIMIDEX) 1 MG tablet Take 1 tablet (1 mg total) by mouth daily. 01/17/16  Yes Serena Croissant, MD  Cholecalciferol (VITAMIN D-3) 1000 UNITS CAPS Take 1 capsule by mouth 3 (three) times a week.   Yes Historical Provider, MD  Coenzyme Q10 (CO Q 10) 100 MG CAPS Take by mouth daily.    Yes Historical Provider, MD  Collagen Hydrolysate POWD 1 scoop by Does not apply route daily. 11/30/15  Yes Serena Croissant, MD  Cyanocobalamin (B-12) 1000  MCG SUBL Place 1 mL under the tongue daily. 11/30/15  Yes Serena Croissant, MD  levothyroxine (SYNTHROID, LEVOTHROID) 25 MCG tablet Take 25 mcg by mouth daily. Dr Horald Pollen sees pt for thyroid   Yes Historical Provider, MD  LIVALO 1 MG TABS Take 1 tablet by mouth every other day. 04/09/14  Yes Historical Provider, MD  losartan-hydrochlorothiazide (HYZAAR) 100-12.5 MG per tablet Take 1 tablet by mouth daily. NO MORE REFILLS WITHOUT OFFICE VISIT - 2ND NOTICE Patient taking differently: Take 1 tablet by mouth daily. Dr. Manus Gunning fills this for pt 05/11/14  Yes Collie Siad English, PA  metFORMIN (GLUCOPHAGE-XR) 500 MG 24 hr tablet Take 1 tablet (500 mg total) by mouth daily with breakfast. NO MORE REFILLS WITHOUT OFFICE VISIT - 2ND NOTICE Patient taking differently: Take 500 mg by mouth daily with breakfast. Dr. Manus Gunning fills this for pt 05/25/14  Yes Collene Gobble, MD  NON FORMULARY 5,000 mg daily. Biotin   Yes Historical Provider, MD  NON FORMULARY 50 mg daily. Zinc   Yes Historical Provider, MD  potassium chloride (K-DUR) 10 MEQ tablet Take 10 mEq by mouth daily.   Yes Historical Provider, MD  rivaroxaban (XARELTO) 20 MG TABS tablet TAKE 1 TABLET (20 MG TOTAL) BY MOUTH DAILY. Patient taking differently: TAKE 1 TABLET (20 MG TOTAL) BY MOUTH DAILY. By Dr. Silvestre Mesi 12/21/15  Yes Duke Salvia, MD  metoprolol succinate (TOPROL-XL) 100 MG 24 hr tablet Take 1 tablet (100 mg total) by mouth daily. NO MORE REFILLS WITHOUT OFFICE VISIT - 2ND NOTICE Patient taking differently: Take 100 mg by mouth daily. Dr. Manus Gunning fills for pt 05/11/14   Garnetta Buddy, PA  Past Medical, Surgical Family and Social History reviewed and updated.    Objective:   Today's Vitals   02/02/16 1041  BP: 112/70  Pulse: 75  Temp: 98.7 F (37.1 C)  TempSrc: Oral  SpO2: 97%  Weight: 219 lb (99.3 kg)  Height: 5\' 6"  (1.676 m)    Wt Readings from Last 3 Encounters:  02/02/16 219 lb (99.3 kg)  01/11/16 221 lb (100.2 kg)    12/21/15 221 lb 9.6 oz (100.5 kg)   Physical Exam  Constitutional: She is oriented to person, place, and time. She appears well-developed and well-nourished.  HENT:  Head: Normocephalic and atraumatic.  Right Ear: External ear normal.  Left Ear: External ear normal.  Nose: Mucosal edema and rhinorrhea present.  Mouth/Throat: No posterior oropharyngeal edema or posterior oropharyngeal erythema.  Eyes: Conjunctivae and EOM are normal. Pupils are equal, round, and reactive to light.  Neck: Normal range of motion. Neck supple.  Cardiovascular: Normal rate, regular rhythm, normal heart  sounds and intact distal pulses.   Pulmonary/Chest: Effort normal and breath sounds normal.  Musculoskeletal: Normal range of motion.  Neurological: She is alert and oriented to person, place, and time.  Skin: Skin is warm and dry.  Psychiatric: She has a normal mood and affect. Her behavior is normal. Judgment and thought content normal.    Assessment & Plan:  1. Nasal congestion 2. Sinus headache  Pt is afebrile and is overall well appearing. Symptoms are likely related to viral illness. Treating symptomatically. If your symptoms have worsened or improved by Tuesday, December 26, please contact me via phone and I'll consider an antibiotic treatment at that time.  -Prednisone 30 mg once daily with food x 5 days. -Atrovent, 2 sprays, twice daily for congestion.  Godfrey Pick. Tiburcio Pea, MSN, FNP-C [facility]

## 2016-02-02 NOTE — Patient Instructions (Addendum)
Take Prednisone 10 mg,  Take 3 pills x 5 days.   Ipratropium (Atrovent) 2 sprays, twice daily.  Call me next Tuesday if symptoms continue to persists.   IF you received an x-ray today, you will receive an invoice from [facility]. Please contact [facility] at [phone] with questions or concerns regarding your invoice.   IF you received labwork today, you will receive an invoice from [place]. Please contact [facility] at [phone] with questions or concerns regarding your invoice.   Our billing staff will not be able to assist you with questions regarding bills from these companies.  You will be contacted with the lab results as soon as they are available. The fastest way to get your results is to activate your My Chart account. Instructions are located on the last page of this paperwork. If you have not heard from [place] regarding the results in 2 weeks, please contact this office.     Sinus Headache A sinus headache occurs when the paranasal sinuses become clogged or swollen. Paranasal sinuses are air pockets within the bones of the face. Sinus headaches can range from mild to severe. What are the causes? A sinus headache can result from various conditions that affect the sinuses, such as:  Colds.  Sinus infections.  Allergies. What are the signs or symptoms? The main symptom of this condition is a headache that may feel like pain or pressure in the face, forehead, ears, or upper teeth. People who have a sinus headache often have other symptoms, such as:  Congested or runny nose.  Fever.  Inability to smell. Weather changes can make symptoms worse. How is this diagnosed? This condition may be diagnosed based on:  A physical exam and medical history.  Imaging tests, such as a CT scan and MRI, to check for problems with the sinuses.  A specialist may look into the sinuses with a tool that has a camera (endoscopy). How is this treated? Treatment for  this condition depends on the cause.  Sinus pain that is caused by a sinus infection may be treated with antibiotic medicine.  Sinus pain that is caused by allergies may be helped by allergy medicines (antihistamines) and medicated nasal sprays.  Sinus pain that is caused by congestion may be helped by flushing the nose and sinuses with saline solution. Follow these instructions at home:  Take medicines only as directed by your health care provider.  If you were prescribed an antibiotic medicine, finish all of it even if you start to feel better.  If you have congestion, use a nasal spray to help reduce pressure.  If directed, apply a warm, moist washcloth to your face to help relieve pain. Contact a health care provider if:  You have headaches more than one time each week.  You have sensitivity to light or sound.  You have a fever.  You feel sick to your stomach (nauseous) or you throw up (vomit).  Your headaches do not get better with treatment. Many people think that they have a sinus headache when they actually have migraines or tension headaches. Get help right away if:  You have vision problems.  You have sudden, severe pain in your face or head.  You have a seizure.  You are confused.  You have a stiff neck. This information is not intended to replace advice given to you by your health care provider. Make sure you discuss any questions you have with your health care provider. Document Released: 03/08/2004 Document Revised:  09/25/2015 Document Reviewed: 01/25/2014 Elsevier Interactive Patient Education  2017 [employer].

## 2016-02-07 ENCOUNTER — Telehealth: Payer: Self-pay

## 2016-02-07 NOTE — Telephone Encounter (Signed)
Pt was seen on 02/02/16 with nasal congestion and she has not improved still coughing runny nose tired  Best number [phone]

## 2016-02-09 NOTE — Telephone Encounter (Signed)
Spoke to pt who reported that she is doing a little better. She finished pred and is taking Mucinex which is helping. Encouraged continued use of Mucinex and drinking plenty of water. Advised pt to call or RTC if she worsens again or starts running a fever. Pt agreed.

## 2016-02-20 NOTE — Congregational Nurse Program (Signed)
Congregational Nurse Program Note  Date of Encounter: 02/20/2016  Past Medical History: Past Medical History:  Diagnosis Date  . Afib ([place])   . Arthritis    WRISTS AND NECK  . Benign positional vertigo   . Bloating   . Breast cancer ([place]) 2016   ER+/PR-/Her2-  . Colon polyps 12/2005  . Diabetes mellitus, type 2 ([place]) 06/2010  . Essential hypertension, benign   . Family history of hypertrophic cardiomyopathy   . Family history of ovarian cancer   . Family history of pancreatic cancer   . Focal nodular hyperplasia of liver    bx 2002  . Glaucoma   . Goiter 10/2006  . Hot flashes   . Hypothyroidism   . Migraines    menstrual  . Obesity   . OSA (obstructive sleep apnea)   . Other and unspecified hyperlipidemia   . Sleep apnea     Encounter Details:     CNP Questionnaire - 02/15/16 1221      Patient Demographics   Is this a new or existing patient? Existing   Patient is considered a/an Not Applicable   Race African-American/Black     Patient Assistance   Location of Patient Assistance Not Applicable   Patient's financial/insurance status Private Insurance Coverage   Uninsured Patient ([place] [employer]/Care Connects) No   Patient referred to apply for the following financial assistance Not Applicable   Food insecurities addressed Not Applicable   Transportation assistance No   Assistance securing medications No   Educational health offerings Exercise/physical activity     Encounter Details   Primary purpose of visit Education/Health Concerns   Was an Emergency Department visit averted? Not Applicable   Does patient have a medical provider? Yes   Patient referred to Not Applicable   Was a mental health screening completed? (GAINS tool) No   Does patient have dental issues? No   Does patient have vision issues? No   Does your patient have an abnormal blood pressure today? No   Since previous encounter, have you referred patient for abnormal blood pressure that  resulted in a new diagnosis or medication change? No   Does your patient have an abnormal blood glucose today? No   Since previous encounter, have you referred patient for abnormal blood glucose that resulted in a new diagnosis or medication change? No   Was there a life-saving intervention made? No     Attended chair yoga class.

## 2016-03-30 DIAGNOSIS — E1165 Type 2 diabetes mellitus with hyperglycemia: Secondary | ICD-10-CM | POA: Diagnosis not present

## 2016-04-05 ENCOUNTER — Encounter: Payer: Self-pay | Admitting: Dietician

## 2016-04-05 ENCOUNTER — Encounter: Payer: 59 | Attending: Family Medicine | Admitting: Dietician

## 2016-04-05 DIAGNOSIS — E118 Type 2 diabetes mellitus with unspecified complications: Secondary | ICD-10-CM

## 2016-04-05 DIAGNOSIS — E119 Type 2 diabetes mellitus without complications: Secondary | ICD-10-CM | POA: Diagnosis present

## 2016-04-05 DIAGNOSIS — Z713 Dietary counseling and surveillance: Secondary | ICD-10-CM | POA: Insufficient documentation

## 2016-04-05 NOTE — Progress Notes (Signed)
Patient was seen on 04/05/16 for the first of a series of three diabetes self-management courses at the [facility].  Patient Education Plan per assessed needs and concerns is to attend four course education program for Diabetes Self Management Education.  The following learning objectives were met by the patient during this class:  Describe diabetes  State some common risk factors for diabetes  Defines the role of glucose and insulin  Identifies type of diabetes and pathophysiology  Describe the relationship between diabetes and cardiovascular risk  State the members of the Healthcare Team  States the rationale for glucose monitoring  State when to test glucose  State their individual Target Range  State the importance of logging glucose readings  Describe how to interpret glucose readings  Identifies A1C target  Explain the correlation between A1c and eAG values  State symptoms and treatment of high blood glucose  State symptoms and treatment of low blood glucose  Explain proper technique for glucose testing  Identifies proper sharps disposal  Handouts given during class include:  Living Well with Diabetes book  Carb Counting and Meal Planning book  Meal Plan Card  Carbohydrate guide  Meal planning worksheet  Low Sodium Flavoring Tips  The diabetes portion plate  I2H to eAG Conversion Chart  Diabetes Medications  Diabetes Recommended Care Schedule  Support Group  Diabetes Success Plan  Core Class Satisfaction Survey  Follow-Up Plan:  Attend core 2

## 2016-04-12 ENCOUNTER — Encounter: Payer: 59 | Attending: Family Medicine | Admitting: Dietician

## 2016-04-12 DIAGNOSIS — Z713 Dietary counseling and surveillance: Secondary | ICD-10-CM | POA: Diagnosis not present

## 2016-04-12 DIAGNOSIS — E119 Type 2 diabetes mellitus without complications: Secondary | ICD-10-CM | POA: Diagnosis not present

## 2016-04-12 DIAGNOSIS — E118 Type 2 diabetes mellitus with unspecified complications: Secondary | ICD-10-CM

## 2016-04-12 NOTE — Progress Notes (Signed)

## 2016-04-12 NOTE — Congregational Nurse Program (Signed)
Congregational Nurse Program Note  Date of Encounter: 04/12/2016  Past Medical History: Past Medical History:  Diagnosis Date  . Afib ([place])   . Arthritis    WRISTS AND NECK  . Benign positional vertigo   . Bloating   . Breast cancer ([place]) 2016   ER+/PR-/Her2-  . Colon polyps 12/2005  . Diabetes mellitus, type 2 ([place]) 06/2010  . Essential hypertension, benign   . Family history of hypertrophic cardiomyopathy   . Family history of ovarian cancer   . Family history of pancreatic cancer   . Focal nodular hyperplasia of liver    bx 2002  . Glaucoma   . Goiter 10/2006  . Hot flashes   . Hypothyroidism   . Migraines    menstrual  . Obesity   . OSA (obstructive sleep apnea)   . Other and unspecified hyperlipidemia   . Sleep apnea     Encounter Details:     CNP Questionnaire - 04/12/16 1121      Patient Demographics   Is this a new or existing patient? Existing   Patient is considered a/an Not Applicable   Race African-American/Black     Patient Assistance   Location of Patient Assistance Not Applicable   Patient's financial/insurance status Private Insurance Coverage   Uninsured Patient ([place] Card/Care Connects) No   Patient referred to apply for the following financial assistance Not Applicable   Food insecurities addressed Not Applicable   Transportation assistance No   Assistance securing medications No   Educational health offerings Exercise/physical activity     Encounter Details   Primary purpose of visit Education/Health Concerns   Was an Emergency Department visit averted? Not Applicable   Does patient have a medical provider? Yes   Patient referred to Not Applicable   Was a mental health screening completed? (GAINS tool) No   Does patient have dental issues? No   Does patient have vision issues? No   Does your patient have an abnormal blood pressure today? No   Since previous encounter, have you referred patient for abnormal blood pressure that  resulted in a new diagnosis or medication change? No   Does your patient have an abnormal blood glucose today? No   Since previous encounter, have you referred patient for abnormal blood glucose that resulted in a new diagnosis or medication change? No   Was there a life-saving intervention made? No    Attended chair yoga class.

## 2016-04-19 ENCOUNTER — Encounter: Payer: 59 | Admitting: Dietician

## 2016-04-19 DIAGNOSIS — E118 Type 2 diabetes mellitus with unspecified complications: Secondary | ICD-10-CM

## 2016-04-19 DIAGNOSIS — Z713 Dietary counseling and surveillance: Secondary | ICD-10-CM | POA: Diagnosis not present

## 2016-04-19 NOTE — Progress Notes (Signed)
Patient was seen on 04/19/16 for the third of a series of three diabetes self-management courses at the [facility].   Jessica Mendoza the amount of activity recommended for healthy living . Describe activities suitable for individual needs . Identify ways to regularly incorporate activity into daily life . Identify barriers to activity and ways to over come these barriers  Identify diabetes medications being personally used and their primary action for lowering glucose and possible side effects . Describe role of stress on blood glucose and develop strategies to address psychosocial issues . Identify diabetes complications and ways to prevent them  Explain how to manage diabetes during illness . Evaluate success in meeting personal goal . Establish 2-3 goals that they will plan to diligently work on until they return for the  24-month follow-up visit  Goals:   I will be active 30 minutes or more 5 times a week  I will test my glucose at least 2 times a day, 7 days a week  I will practice clean eating and utilize the MyPlate App every day  Your patient has identified these potential barriers to change:  "Pulling all the information together to make it fit for me.  At times it gets a little overwhelming.  Your patient has identified their diabetes self-care support plan as  [facility] Support Group On-line Resources Team of Doctors Plan:  Attend Support Group as desired

## 2016-05-07 DIAGNOSIS — G4733 Obstructive sleep apnea (adult) (pediatric): Secondary | ICD-10-CM | POA: Diagnosis not present

## 2016-05-07 NOTE — Congregational Nurse Program (Signed)
Congregational Nurse Program Note  Date of Encounter: 05/07/2016  Past Medical History: Past Medical History:  Diagnosis Date  . Afib ([place])   . Arthritis    WRISTS AND NECK  . Benign positional vertigo   . Bloating   . Breast cancer ([place]) 2016   ER+/PR-/Her2-  . Colon polyps 12/2005  . Diabetes mellitus, type 2 ([place]) 06/2010  . Essential hypertension, benign   . Family history of hypertrophic cardiomyopathy   . Family history of ovarian cancer   . Family history of pancreatic cancer   . Focal nodular hyperplasia of liver    bx 2002  . Glaucoma   . Goiter 10/2006  . Hot flashes   . Hypothyroidism   . Migraines    menstrual  . Obesity   . OSA (obstructive sleep apnea)   . Other and unspecified hyperlipidemia   . Sleep apnea     Encounter Details:     CNP Questionnaire - 05/07/16 1743      Patient Demographics   Is this a new or existing patient? Existing   Patient is considered a/an Not Applicable   Race African-American/Black     Patient Assistance   Location of Patient Assistance Not Applicable   Patient's financial/insurance status Private Insurance Coverage   Uninsured Patient ([place] Card/Care Connects) No   Patient referred to apply for the following financial assistance Not Applicable   Food insecurities addressed Not Applicable   Transportation assistance No   Assistance securing medications No   Educational health offerings Exercise/physical activity     Encounter Details   Primary purpose of visit Education/Health Concerns   Was an Emergency Department visit averted? Not Applicable   Does patient have a medical provider? Yes   Patient referred to Not Applicable   Was a mental health screening completed? (GAINS tool) No   Does patient have dental issues? No   Does patient have vision issues? No   Does your patient have an abnormal blood pressure today? No   Since previous encounter, have you referred patient for abnormal blood pressure that  resulted in a new diagnosis or medication change? No   Does your patient have an abnormal blood glucose today? No   Since previous encounter, have you referred patient for abnormal blood glucose that resulted in a new diagnosis or medication change? No   Was there a life-saving intervention made? No    attended chair yoga class

## 2016-05-30 ENCOUNTER — Ambulatory Visit (HOSPITAL_BASED_OUTPATIENT_CLINIC_OR_DEPARTMENT_OTHER): Payer: 59 | Admitting: Hematology and Oncology

## 2016-05-30 ENCOUNTER — Encounter: Payer: Self-pay | Admitting: Hematology and Oncology

## 2016-05-30 ENCOUNTER — Telehealth: Payer: Self-pay | Admitting: Hematology and Oncology

## 2016-05-30 DIAGNOSIS — Z79811 Long term (current) use of aromatase inhibitors: Secondary | ICD-10-CM | POA: Diagnosis not present

## 2016-05-30 DIAGNOSIS — Z170 Estrogen receptor positive status [ER+]: Secondary | ICD-10-CM

## 2016-05-30 DIAGNOSIS — C50411 Malignant neoplasm of upper-outer quadrant of right female breast: Secondary | ICD-10-CM

## 2016-05-30 NOTE — Assessment & Plan Note (Signed)
Rt Lumpectomy 11/25/14: IDC 1.7 cm Grade 2 with DCIS , 0/2 LN, ER 100, PR 0%, Her 2 Neg T1CN0 (Stage 1A) Oncotype DX 18, 11% risk of recurrence Adjuvant radiation therapy 12/23/2014 to 01/28/2015  Current treatment: Adjuvant antiestrogen therapy with anastrozole 1 mg daily 5 years started 03/02/2015 Anastrozole toxicities: 1. Occasional hot flashes 2-3 times a week (attributes them to eating sweets ) Denies any arthralgias or myalgias.  Survivorship: Encouraged her to continue to exercise and stay active. Surveillance for breast cancer:  1. Breast exam 11/30/2015: Benign scar tissue 2. mammograms at [facility]:  August 2017 was benign  Return to clinic in 1 year for follow-up

## 2016-05-30 NOTE — Progress Notes (Signed)
Patient Care Team: Gaynelle Arabian, MD as PCP - General (Family Medicine) Jacelyn Pi, MD (Endocrinology) Inda Castle, MD (Gastroenterology) Vania Rea, MD (Obstetrics and Gynecology) Excell Seltzer, MD as Consulting Physician (General Surgery) Nicholas Lose, MD as Consulting Physician (Hematology and Oncology) Eppie Gibson, MD as Attending Physician (Radiation Oncology) Mauro Kaufmann, RN as Registered Nurse Rockwell Germany, RN as Registered Nurse Holley Bouche, NP as Nurse Practitioner (Nurse Practitioner) Sylvan Cheese, NP as Nurse Practitioner (Nurse Practitioner)  DIAGNOSIS:  Encounter Diagnosis  Name Primary?  . Malignant neoplasm of upper-outer quadrant of right breast in female, estrogen receptor positive ([place])     SUMMARY OF ONCOLOGIC HISTORY:   Breast cancer of upper-outer quadrant of right female breast ([place])   09/08/2014 Mammogram    Right breast: irregular mass with calcifications at 11:00, posterior depth.      09/15/2014 Breast US    Right breast: 1.6 cm irregular mass with indistinct margin at 11:00, posterior depth, 6 CFN. Related microcalcifications. Hypoechoic, correlates with mammographic findings.      09/16/2014 Initial Biopsy    Right breast biopsy: Invasive ductal carcinoma, grade 2, ER+ 100%, PR- 0%, HER-2 negative, Ki-67 10%      09/16/2014 Clinical Stage    Stage IA: T1c N0      09/23/2014 Procedure    Breast/Ovarian panel ([facility]) reveals no clinically signficant variant at ATM, BARD1, BRCA1, BRCA2, BRIP1, CDH1, CHEK2, EPCAM, FANCC, MLH1, MSH2, MSH6, NBN, PALB2, PMS2, PTEN, RAD51C, RAD51D, TP53, and XRCC2.      11/25/2014 Definitive Surgery    Right lumpectomy: IDC 1.7 cm Grade 2 with DCIS, 0/2 LN, ER+ 100, PR- 0%, HER 2 Neg       11/25/2014 Pathologic Stage    Stage IA: T1c N0      11/25/2014 Oncotype testing    Oncotype DX 18, 11% ROR      12/23/2014 - 01/28/2015 Radiation Therapy    Adjuvant RT: Right breast /  50 Gy in 25 fractions      03/02/2015 -  Anti-estrogen oral therapy    Anastrozole 1 mg daily 5 years      03/31/2015 Survivorship    Survivorship visit completed and copy of care plan given to patient       CHIEF COMPLIANT: Follow-up on anastrozole therapy  INTERVAL HISTORY: Jessica Mendoza is a 61 year old with above-mentioned history of stage I breast cancer on the right side underwent lumpectomy followed by adjuvant radiation is currently on anastrozole. She is tolerating anastrozole fairly well. She continues to have hot flashes several times in the day. She is able to manage them fairly well. She has some tightness in the chest.  REVIEW OF SYSTEMS:   Constitutional: Denies fevers, chills or abnormal weight loss Eyes: Denies blurriness of vision Ears, nose, mouth, throat, and face: Denies mucositis or sore throat Respiratory: Denies cough, dyspnea or wheezes Cardiovascular: Denies palpitation, chest discomfort Gastrointestinal:  Denies nausea, heartburn or change in bowel habits Skin: Denies abnormal skin rashes Lymphatics: Denies new lymphadenopathy or easy bruising Neurological:Denies numbness, tingling or new weaknesses Behavioral/Psych: Mood is stable, no new changes  Extremities: No lower extremity edema Breast:  denies any pain or lumps or nodules in either breasts All other systems were reviewed with the patient and are negative.  I have reviewed the past medical history, past surgical history, social history and family history with the patient and they are unchanged from previous note.  ALLERGIES:  is allergic to simvastatin.  MEDICATIONS:  Current Outpatient Prescriptions  Medication Sig Dispense Refill  . amLODipine (NORVASC) 10 MG tablet Take 1 tablet (10 mg total) by mouth daily. NO MORE REFILLS WITHOUT OFFICE VISIT - 2ND NOTICE (Patient taking differently: Take 10 mg by mouth daily. ) 15 tablet 0  . anastrozole (ARIMIDEX) 1 MG tablet Take 1 tablet (1 mg  total) by mouth daily. 90 tablet 3  . Cholecalciferol (VITAMIN D-3) 1000 UNITS CAPS Take 1 capsule by mouth 3 (three) times a week.    . Coenzyme Q10 (CO Q 10) 100 MG CAPS Take by mouth daily.     . Collagen Hydrolysate POWD 1 scoop by Does not apply route daily.  0  . Cyanocobalamin (B-12) 1000 MCG SUBL Place 1 mL under the tongue daily. 30 each   . ipratropium (ATROVENT) 0.03 % nasal spray Place 2 sprays into both nostrils 2 (two) times daily. 30 mL 0  . levothyroxine (SYNTHROID, LEVOTHROID) 25 MCG tablet Take 25 mcg by mouth daily. Dr Suzette Battiest sees pt for thyroid    . LIVALO 1 MG TABS Take 1 tablet by mouth every other day.  0  . losartan-hydrochlorothiazide (HYZAAR) 100-12.5 MG per tablet Take 1 tablet by mouth daily. NO MORE REFILLS WITHOUT OFFICE VISIT - 2ND NOTICE (Patient taking differently: Take 1 tablet by mouth daily. Dr. Marisue Humble fills this for pt) 15 tablet 0  . metFORMIN (GLUCOPHAGE-XR) 500 MG 24 hr tablet Take 1 tablet (500 mg total) by mouth daily with breakfast. NO MORE REFILLS WITHOUT OFFICE VISIT - 2ND NOTICE (Patient taking differently: Take 500 mg by mouth daily with breakfast. Dr. Marisue Humble fills this for pt) 15 tablet 0  . metoprolol succinate (TOPROL-XL) 100 MG 24 hr tablet Take 1 tablet (100 mg total) by mouth daily. NO MORE REFILLS WITHOUT OFFICE VISIT - 2ND NOTICE (Patient taking differently: Take 100 mg by mouth daily. Dr. Marisue Humble fills for pt) 15 tablet 0  . NON FORMULARY 5,000 mg daily. Biotin    . NON FORMULARY 50 mg daily. Zinc    . potassium chloride (K-DUR) 10 MEQ tablet Take 10 mEq by mouth daily.    . rivaroxaban (XARELTO) 20 MG TABS tablet TAKE 1 TABLET (20 MG TOTAL) BY MOUTH DAILY. (Patient taking differently: TAKE 1 TABLET (20 MG TOTAL) BY MOUTH DAILY. By Dr. Magda Paganini) 30 tablet 11   Current Facility-Administered Medications  Medication Dose Route Frequency Provider Last Rate Last Dose  . 0.9 %  sodium chloride infusion  500 mL Intravenous Continuous Gatha Mayer, MD        PHYSICAL EXAMINATION: ECOG PERFORMANCE STATUS: 1 - Symptomatic but completely ambulatory  Vitals:   05/30/16 0837  BP: 119/69  Pulse: 74  Resp: 18  Temp: 98 F (36.7 C)   Filed Weights   05/30/16 0837  Weight: 212 lb 9.6 oz (96.4 kg)    GENERAL:alert, no distress and comfortable SKIN: skin color, texture, turgor are normal, no rashes or significant lesions EYES: normal, Conjunctiva are pink and non-injected, sclera clear OROPHARYNX:no exudate, no erythema and lips, buccal mucosa, and tongue normal  NECK: supple, thyroid normal size, non-tender, without nodularity LYMPH:  no palpable lymphadenopathy in the cervical, axillary or inguinal LUNGS: clear to auscultation and percussion with normal breathing effort HEART: regular rate & rhythm and no murmurs and no lower extremity edema ABDOMEN:abdomen soft, non-tender and normal bowel sounds MUSCULOSKELETAL:no cyanosis of digits and no clubbing  NEURO: alert & oriented x 3 with fluent speech, no focal  motor/sensory deficits EXTREMITIES: No lower extremity edema BREAST: No palpable masses or nodules in either right or left breasts. No palpable axillary supraclavicular or infraclavicular adenopathy no breast tenderness or nipple discharge. (exam performed in the presence of a chaperone)  LABORATORY DATA:  I have reviewed the data as listed   Chemistry      Component Value Date/Time   NA 138 11/19/2014 0920   NA 140 09/22/2014 1248   K 4.1 11/19/2014 0920   K 3.6 09/22/2014 1248   CL 100 (L) 11/19/2014 0920   CO2 29 11/19/2014 0920   CO2 29 09/22/2014 1248   BUN 9 11/19/2014 0920   BUN 11.9 09/22/2014 1248   CREATININE 0.89 11/19/2014 0920   CREATININE 1.1 09/22/2014 1248   GLU 127 10/10/2010      Component Value Date/Time   CALCIUM 9.0 11/19/2014 0920   CALCIUM 9.3 09/22/2014 1248   ALKPHOS 87 09/22/2014 1248   AST 37 (H) 09/22/2014 1248   ALT 61 (H) 09/22/2014 1248   BILITOT 0.64 09/22/2014 1248         Lab Results  Component Value Date   WBC 5.5 09/22/2014   HGB 12.6 11/25/2014   HCT 39.1 09/22/2014   MCV 83.9 09/22/2014   PLT 251 09/22/2014   NEUTROABS 2.7 09/22/2014    ASSESSMENT & PLAN:  Breast cancer of upper-outer quadrant of right female breast ([place]) Rt Lumpectomy 11/25/14: IDC 1.7 cm Grade 2 with DCIS , 0/2 LN, ER 100, PR 0%, Her 2 Neg T1CN0 (Stage 1A) Oncotype DX 18, 11% risk of recurrence Adjuvant radiation therapy 12/23/2014 to 01/28/2015  Current treatment: Adjuvant antiestrogen therapy with anastrozole 1 mg daily 5 years started 03/02/2015 Anastrozole toxicities: 1. Occasional hot flashes 2-3 times a week (attributes them to eating sweets ) Denies any arthralgias or myalgias.  Survivorship: Encouraged her to continue to exercise and stay active. Surveillance for breast cancer:  1. Breast exam 11/30/2015: Benign scar tissue 2. mammograms at [facility]:  August 2017 was benign  Return to clinic in 1 year for follow-up   I spent 25 minutes talking to the patient of which more than half was spent in counseling and coordination of care.  No orders of the defined types were placed in this encounter.  The patient has a good understanding of the overall plan. she agrees with it. she will call with any problems that may develop before the next visit here.   Rulon Eisenmenger, MD 05/30/16

## 2016-05-30 NOTE — Telephone Encounter (Signed)
Gave patient avs report and appointments for April 2019. Patient also given appointment for bone density at [facility] for 10/03/2016. Test ordered by [facility]. Per patient request Jessica Mendoza also scheduled annual mammo to take place with bone density 8/22.

## 2016-07-11 DIAGNOSIS — E7800 Pure hypercholesterolemia, unspecified: Secondary | ICD-10-CM | POA: Diagnosis not present

## 2016-07-11 DIAGNOSIS — Z7984 Long term (current) use of oral hypoglycemic drugs: Secondary | ICD-10-CM | POA: Diagnosis not present

## 2016-07-11 DIAGNOSIS — I10 Essential (primary) hypertension: Secondary | ICD-10-CM | POA: Diagnosis not present

## 2016-07-11 DIAGNOSIS — E1165 Type 2 diabetes mellitus with hyperglycemia: Secondary | ICD-10-CM | POA: Diagnosis not present

## 2016-07-17 NOTE — Congregational Nurse Program (Signed)
Congregational Nurse Program Note  Date of Encounter: 07/17/2016  Past Medical History: Past Medical History:  Diagnosis Date  . Afib ([place])   . Arthritis    WRISTS AND NECK  . Benign positional vertigo   . Bloating   . Breast cancer ([place]) 2016   ER+/PR-/Her2-  . Colon polyps 12/2005  . Diabetes mellitus, type 2 ([place]) 06/2010  . Essential hypertension, benign   . Family history of hypertrophic cardiomyopathy   . Family history of ovarian cancer   . Family history of pancreatic cancer   . Focal nodular hyperplasia of liver    bx 2002  . Glaucoma   . Goiter 10/2006  . Hot flashes   . Hypothyroidism   . Migraines    menstrual  . Obesity   . OSA (obstructive sleep apnea)   . Other and unspecified hyperlipidemia   . Sleep apnea     Encounter Details:     CNP Questionnaire - 07/17/16 0154      Patient Demographics   Is this a new or existing patient? Existing   Patient is considered a/an Not Applicable   Race African-American/Black     Patient Assistance   Location of Patient Assistance Not Applicable   Patient's financial/insurance status Private Insurance Coverage   Uninsured Patient ([place] Card/Care Connects) No   Patient referred to apply for the following financial assistance Not Applicable   Food insecurities addressed Not Applicable   Transportation assistance No   Assistance securing medications No   Educational health offerings Exercise/physical activity     Encounter Details   Primary purpose of visit Education/Health Concerns   Was an Emergency Department visit averted? Not Applicable   Does patient have a medical provider? Yes   Patient referred to Not Applicable   Was a mental health screening completed? (GAINS tool) No   Does patient have dental issues? No   Does patient have vision issues? No   Does your patient have an abnormal blood pressure today? No   Since previous encounter, have you referred patient for abnormal blood pressure that  resulted in a new diagnosis or medication change? No   Does your patient have an abnormal blood glucose today? No   Since previous encounter, have you referred patient for abnormal blood glucose that resulted in a new diagnosis or medication change? No   Was there a life-saving intervention made? No    Attended chair yoga class.

## 2016-08-01 DIAGNOSIS — E119 Type 2 diabetes mellitus without complications: Secondary | ICD-10-CM | POA: Diagnosis not present

## 2016-08-10 NOTE — Congregational Nurse Program (Signed)
Congregational Nurse Program Note  Date of Encounter: 08/10/2016  Past Medical History: Past Medical History:  Diagnosis Date  . Afib ([facility])   . Arthritis    WRISTS AND NECK  . Benign positional vertigo   . Bloating   . Breast cancer ([facility]) 2016   ER+/PR-/Her2-  . Colon polyps 12/2005  . Diabetes mellitus, type 2 ([facility]) 06/2010  . Essential hypertension, benign   . Family history of hypertrophic cardiomyopathy   . Family history of ovarian cancer   . Family history of pancreatic cancer   . Focal nodular hyperplasia of liver    bx 2002  . Glaucoma   . Goiter 10/2006  . Hot flashes   . Hypothyroidism   . Migraines    menstrual  . Obesity   . OSA (obstructive sleep apnea)   . Other and unspecified hyperlipidemia   . Sleep apnea     Encounter Details: Attended chair yoga class.   

## 2016-10-10 ENCOUNTER — Encounter: Payer: Self-pay | Admitting: Hematology and Oncology

## 2016-10-10 DIAGNOSIS — Z853 Personal history of malignant neoplasm of breast: Secondary | ICD-10-CM | POA: Diagnosis not present

## 2016-10-10 DIAGNOSIS — Z780 Asymptomatic menopausal state: Secondary | ICD-10-CM | POA: Diagnosis not present

## 2016-10-10 DIAGNOSIS — R928 Other abnormal and inconclusive findings on diagnostic imaging of breast: Secondary | ICD-10-CM | POA: Diagnosis not present

## 2016-10-16 NOTE — Congregational Nurse Program (Signed)
Congregational Nurse Program Note  Date of Encounter: 10/16/2016  Past Medical History: Past Medical History:  Diagnosis Date  . Afib (HCC)   . Arthritis    WRISTS AND NECK  . Benign positional vertigo   . Bloating   . Breast cancer (HCC) 2016   ER+/PR-/Her2-  . Colon polyps 12/2005  . Diabetes mellitus, type 2 (HCC) 06/2010  . Essential hypertension, benign   . Family history of hypertrophic cardiomyopathy   . Family history of ovarian cancer   . Family history of pancreatic cancer   . Focal nodular hyperplasia of liver    bx 2002  . Glaucoma   . Goiter 10/2006  . Hot flashes   . Hypothyroidism   . Migraines    menstrual  . Obesity   . OSA (obstructive sleep apnea)   . Other and unspecified hyperlipidemia   . Sleep apnea     Encounter Details:     CNP Questionnaire - 10/16/16 1512      Patient Demographics   Is this a new or existing patient? Existing   Patient is considered a/an Not Applicable   Race African-American/Black     Patient Assistance   Location of Patient Assistance Not Applicable   Patient's financial/insurance status Private Insurance Coverage   Uninsured Patient ([place] [employer]/Care Connects) No   Patient referred to apply for the following financial assistance Not Applicable   Food insecurities addressed Not Applicable   Transportation assistance No   Assistance securing medications No   Educational health offerings Exercise/physical activity     Encounter Details   Primary purpose of visit Education/Health Concerns   Was an Emergency Department visit averted? Not Applicable   Does patient have a medical provider? Yes   Patient referred to Not Applicable   Was a mental health screening completed? (GAINS tool) No   Does patient have dental issues? No   Does patient have vision issues? No   Does your patient have an abnormal blood pressure today? No   Since previous encounter, have you referred patient for abnormal blood pressure that  resulted in a new diagnosis or medication change? No   Does your patient have an abnormal blood glucose today? No   Since previous encounter, have you referred patient for abnormal blood glucose that resulted in a new diagnosis or medication change? No   Was there a life-saving intervention made? No     Attended chair yoga class  

## 2016-10-24 DIAGNOSIS — E049 Nontoxic goiter, unspecified: Secondary | ICD-10-CM | POA: Diagnosis not present

## 2016-10-31 DIAGNOSIS — E049 Nontoxic goiter, unspecified: Secondary | ICD-10-CM | POA: Diagnosis not present

## 2016-11-06 DIAGNOSIS — Z01419 Encounter for gynecological examination (general) (routine) without abnormal findings: Secondary | ICD-10-CM | POA: Diagnosis not present

## 2016-11-08 NOTE — Congregational Nurse Program (Signed)
Congregational Nurse Program Note  Date of Encounter: 11/08/2016  Past Medical History: Past Medical History:  Diagnosis Date  . Afib (HCC)   . Arthritis    WRISTS AND NECK  . Benign positional vertigo   . Bloating   . Breast cancer (HCC) 2016   ER+/PR-/Her2-  . Colon polyps 12/2005  . Diabetes mellitus, type 2 (HCC) 06/2010  . Essential hypertension, benign   . Family history of hypertrophic cardiomyopathy   . Family history of ovarian cancer   . Family history of pancreatic cancer   . Focal nodular hyperplasia of liver    bx 2002  . Glaucoma   . Goiter 10/2006  . Hot flashes   . Hypothyroidism   . Migraines    menstrual  . Obesity   . OSA (obstructive sleep apnea)   . Other and unspecified hyperlipidemia   . Sleep apnea     Encounter Details:     CNP Questionnaire - 11/08/16 1414      Patient Demographics   Is this a new or existing patient? Existing   Patient is considered a/an Not Applicable   Race African-American/Black     Patient Assistance   Location of Patient Assistance Not Applicable   Patient's financial/insurance status Private Insurance Coverage   Uninsured Patient ([place] Card/Care Connects) No   Patient referred to apply for the following financial assistance Not Applicable   Food insecurities addressed Not Applicable   Transportation assistance No   Assistance securing medications No   Educational health offerings Exercise/physical activity     Encounter Details   Primary purpose of visit Education/Health Concerns   Was an Emergency Department visit averted? Not Applicable   Does patient have a medical provider? Yes   Patient referred to Not Applicable   Was a mental health screening completed? (GAINS tool) No   Does patient have dental issues? No   Does patient have vision issues? No   Does your patient have an abnormal blood pressure today? No   Since previous encounter, have you referred patient for abnormal blood pressure that  resulted in a new diagnosis or medication change? No   Does your patient have an abnormal blood glucose today? No   Since previous encounter, have you referred patient for abnormal blood glucose that resulted in a new diagnosis or medication change? No   Was there a life-saving intervention made? No    Attended chair yoga class.              

## 2016-11-28 DIAGNOSIS — G4733 Obstructive sleep apnea (adult) (pediatric): Secondary | ICD-10-CM | POA: Diagnosis not present

## 2016-12-14 ENCOUNTER — Telehealth: Payer: Self-pay | Admitting: Internal Medicine

## 2016-12-14 NOTE — Telephone Encounter (Signed)
Walk In pt [facility] paperwork Dropped off placed in [place] [facility]

## 2016-12-18 NOTE — Congregational Nurse Program (Signed)
Congregational Nurse Program Note  Date of Encounter: 12/18/2016  Past Medical History: Past Medical History:  Diagnosis Date  . Afib ([place])   . Arthritis    WRISTS AND NECK  . Benign positional vertigo   . Bloating   . Breast cancer ([place]) 2016   ER+/PR-/Her2-  . Colon polyps 12/2005  . Diabetes mellitus, type 2 ([place]) 06/2010  . Essential hypertension, benign   . Family history of hypertrophic cardiomyopathy   . Family history of ovarian cancer   . Family history of pancreatic cancer   . Focal nodular hyperplasia of liver    bx 2002  . Glaucoma   . Goiter 10/2006  . Hot flashes   . Hypothyroidism   . Migraines    menstrual  . Obesity   . OSA (obstructive sleep apnea)   . Other and unspecified hyperlipidemia   . Sleep apnea     Encounter Details: CNP Questionnaire - 12/18/16 1422      Questionnaire   Patient Status  Not Applicable    Race  Black or African American    Location Patient Served At  [place]    Uninsured  Not Applicable    Food  No food insecurities    Housing/Utilities  Yes, have permanent housing    Transportation  No transportation needs    Interpersonal Safety  Yes, feel physically and emotionally safe where you currently live    Medication  No medication insecurities    Medical Provider  Yes    Referrals  Not Applicable    ED Visit Averted  Not Applicable    Life-Saving Intervention Made  Not Applicable     Madelaine Etienne, [place], [phone].

## 2017-01-06 ENCOUNTER — Other Ambulatory Visit: Payer: Self-pay | Admitting: Internal Medicine

## 2017-01-06 DIAGNOSIS — I480 Paroxysmal atrial fibrillation: Secondary | ICD-10-CM

## 2017-01-06 DIAGNOSIS — Z8249 Family history of ischemic heart disease and other diseases of the circulatory system: Secondary | ICD-10-CM

## 2017-01-11 NOTE — Congregational Nurse Program (Signed)
Congregational Nurse Program Note  Date of Encounter: 01/11/2017  Past Medical History: Past Medical History:  Diagnosis Date  . Afib ([place])   . Arthritis    WRISTS AND NECK  . Benign positional vertigo   . Bloating   . Breast cancer ([place]) 2016   ER+/PR-/Her2-  . Colon polyps 12/2005  . Diabetes mellitus, type 2 ([place]) 06/2010  . Essential hypertension, benign   . Family history of hypertrophic cardiomyopathy   . Family history of ovarian cancer   . Family history of pancreatic cancer   . Focal nodular hyperplasia of liver    bx 2002  . Glaucoma   . Goiter 10/2006  . Hot flashes   . Hypothyroidism   . Migraines    menstrual  . Obesity   . OSA (obstructive sleep apnea)   . Other and unspecified hyperlipidemia   . Sleep apnea     Encounter Details: CNP Questionnaire - 01/11/17 2234      Questionnaire   Patient Status  Not Applicable    Race  Black or African American    Location Patient Served At  [place]    Uninsured  Not Applicable    Food  No food insecurities    Housing/Utilities  Yes, have permanent housing    Transportation  No transportation needs    Interpersonal Safety  Yes, feel physically and emotionally safe where you currently live    Medication  No medication insecurities    Medical Provider  Yes    Referrals  Not Applicable    ED Visit Averted  Not Applicable    Life-Saving Intervention Made  Not Applicable     CNP, Madelaine Etienne, [phone].

## 2017-01-16 DIAGNOSIS — E7800 Pure hypercholesterolemia, unspecified: Secondary | ICD-10-CM | POA: Diagnosis not present

## 2017-01-16 DIAGNOSIS — E119 Type 2 diabetes mellitus without complications: Secondary | ICD-10-CM | POA: Diagnosis not present

## 2017-01-16 DIAGNOSIS — Z0000 Encounter for general adult medical examination without abnormal findings: Secondary | ICD-10-CM | POA: Diagnosis not present

## 2017-01-16 DIAGNOSIS — I10 Essential (primary) hypertension: Secondary | ICD-10-CM | POA: Diagnosis not present

## 2017-01-30 ENCOUNTER — Other Ambulatory Visit: Payer: Self-pay | Admitting: Hematology and Oncology

## 2017-02-13 ENCOUNTER — Encounter: Payer: Self-pay | Admitting: Internal Medicine

## 2017-02-13 ENCOUNTER — Ambulatory Visit: Payer: 59 | Admitting: Internal Medicine

## 2017-02-13 ENCOUNTER — Other Ambulatory Visit: Payer: Self-pay

## 2017-02-13 VITALS — BP 110/72 | HR 72 | Ht 66.0 in | Wt 214.0 lb

## 2017-02-13 DIAGNOSIS — Z8249 Family history of ischemic heart disease and other diseases of the circulatory system: Secondary | ICD-10-CM

## 2017-02-13 DIAGNOSIS — I480 Paroxysmal atrial fibrillation: Secondary | ICD-10-CM

## 2017-02-13 LAB — CBC WITH DIFFERENTIAL/PLATELET
Basophils Absolute: 0 10*3/uL (ref 0.0–0.2)
Basos: 0 %
EOS (ABSOLUTE): 0.3 10*3/uL (ref 0.0–0.4)
Eos: 6 %
Hematocrit: 35.9 % (ref 34.0–46.6)
Hemoglobin: 12 g/dL (ref 11.1–15.9)
Immature Grans (Abs): 0 10*3/uL (ref 0.0–0.1)
Immature Granulocytes: 0 %
Lymphocytes Absolute: 1.6 10*3/uL (ref 0.7–3.1)
Lymphs: 36 %
MCH: 27.7 pg (ref 26.6–33.0)
MCHC: 33.4 g/dL (ref 31.5–35.7)
MCV: 83 fL (ref 79–97)
Monocytes Absolute: 0.3 10*3/uL (ref 0.1–0.9)
Monocytes: 7 %
Neutrophils Absolute: 2.2 10*3/uL (ref 1.4–7.0)
Neutrophils: 51 %
Platelets: 230 10*3/uL (ref 150–379)
RBC: 4.33 x10E6/uL (ref 3.77–5.28)
RDW: 14.6 % (ref 12.3–15.4)
WBC: 4.5 10*3/uL (ref 3.4–10.8)

## 2017-02-13 MED ORDER — AMLODIPINE BESYLATE 5 MG PO TABS: 5.0000 mg | ORAL_TABLET | Freq: Every day | ORAL | 3 refills | Status: DC

## 2017-02-13 MED ORDER — RIVAROXABAN 20 MG PO TABS: 20.0000 mg | ORAL_TABLET | Freq: Every day | ORAL | 5 refills | Status: DC

## 2017-02-13 NOTE — Patient Instructions (Addendum)
Medication Instructions: Your physician has recommended you make the following change in your medication:  -1) DECREASE Amlodipine 5 mg - Take 1 tablet (5 mg) by mouth daily - NEW RX SENT - You may cut your 10 mg tablets in half until you run out.   Labwork: Your physician has recommended that you have lab work today: CBC   Procedures/Testing: None Ordered  Follow-Up: Your physician wants you to follow-up in: 1 YEAR with Tommye Standard, PA-C. You will receive a reminder letter in the mail two months in advance. If you don't receive a letter, please call our office to schedule the follow-up appointment.   If you need a refill on your cardiac medications before your next appointment, please call your pharmacy.

## 2017-02-13 NOTE — Progress Notes (Signed)
She is in skf      Patient Care Team: Gaynelle Arabian, MD as PCP - General (Family Medicine) Jacelyn Pi, MD (Endocrinology) Inda Castle, MD (Inactive) (Gastroenterology) Vania Rea, MD (Obstetrics and Gynecology) Excell Seltzer, MD as Consulting Physician (General Surgery) Nicholas Lose, MD as Consulting Physician (Hematology and Oncology) Eppie Gibson, MD as Attending Physician (Radiation Oncology) Mauro Kaufmann, RN as Registered Nurse Rockwell Germany, RN as Registered Nurse Holley Bouche, NP as Nurse Practitioner (Nurse Practitioner) Sylvan Cheese, NP as Nurse Practitioner (Nurse Practitioner)   HPI  Jessica Mendoza is a 62 y.o. female Seen With Chief Complaint of  atrial fibrillation associated with exercise intolerance. She had a CHADS2 score of 2 and a CHADS-VASc score of 3 and is on Rivaroxaban;    She underwent cardioversion 5/14  No interval atrial fibrillation.  She has mild dyspnea on exertion which is unchanged.  She had an episode of chest pain a few weeks ago following taking a pill.  It lasted a couple of minutes.  She is exercising regularly [employer] is had no chest discomfort and has had no change in her exercise tolerance.  2014 Myoview was normal  AHI was 17 on sleep study   There is a family history of HCM although her echocardiogram doesn't demonstrate any evidence of LVH.  Date Cr Hgb  10/16 0.89 12.6        No bleeding issues on her anticoagulation.  Blood work was recently checked by her PCP.  Past Medical History:  Diagnosis Date  . Afib ([place])   . Arthritis    WRISTS AND NECK  . Benign positional vertigo   . Bloating   . Breast cancer ([place]) 2016   ER+/PR-/Her2-  . Colon polyps 12/2005  . Diabetes mellitus, type 2 ([place]) 06/2010  . Essential hypertension, benign   . Family history of hypertrophic cardiomyopathy   . Family history of ovarian cancer   . Family history of pancreatic cancer   . Focal  nodular hyperplasia of liver    bx 2002  . Glaucoma   . Goiter 10/2006  . Hot flashes   . Hypothyroidism   . Migraines    menstrual  . Obesity   . OSA (obstructive sleep apnea)   . Other and unspecified hyperlipidemia   . Sleep apnea     Past Surgical History:  Procedure Laterality Date  . BREAST LUMPECTOMY     right breast- benign  . BREAST LUMPECTOMY WITH RADIOACTIVE SEED AND SENTINEL LYMPH NODE BIOPSY Right 11/25/2014   Procedure: RIGHT BREAST LUMPECTOMY WITH RADIOACTIVE SEED AND RIGHT AXILLARY SENTINEL LYMPH NODE BIOPSY;  Surgeon: Excell Seltzer, MD;  Location: [facility];  Service: General;  Laterality: Right;  . CARDIOVERSION N/A 07/11/2012   Procedure: CARDIOVERSION;  Surgeon: Larey Dresser, MD;  Location: [facility];  Service: Cardiovascular;  Laterality: N/A;  . LIVER BIOPSY  2002    Current Outpatient Medications  Medication Sig Dispense Refill  . amLODipine (NORVASC) 10 MG tablet Take 1 tablet (10 mg total) by mouth daily. NO MORE REFILLS WITHOUT OFFICE VISIT - 2ND NOTICE (Patient taking differently: Take 10 mg by mouth daily. ) 15 tablet 0  . anastrozole (ARIMIDEX) 1 MG tablet TAKE 1 TABLET BY MOUTH  DAILY 90 tablet 3  . Cholecalciferol (VITAMIN D-3) 1000 UNITS CAPS Take 1 capsule by mouth 3 (three) times a week.    . Coenzyme Q10 (CO Q 10) 100 MG CAPS Take by  mouth daily.     . Collagen Hydrolysate POWD 1 scoop by Does not apply route daily.  0  . Cyanocobalamin (B-12) 1000 MCG SUBL Place 1 mL under the tongue daily. 30 each   . levothyroxine (SYNTHROID, LEVOTHROID) 25 MCG tablet Take 25 mcg by mouth daily. Dr Suzette Battiest sees pt for thyroid    . LIVALO 1 MG TABS Take 1 tablet by mouth every other day.  0  . losartan-hydrochlorothiazide (HYZAAR) 100-12.5 MG per tablet Take 1 tablet by mouth daily. NO MORE REFILLS WITHOUT OFFICE VISIT - 2ND NOTICE (Patient taking differently: Take 1 tablet by mouth daily. Dr. Marisue Humble fills this for pt) 15 tablet 0  .  metFORMIN (GLUCOPHAGE-XR) 500 MG 24 hr tablet Take 1 tablet (500 mg total) by mouth daily with breakfast. NO MORE REFILLS WITHOUT OFFICE VISIT - 2ND NOTICE (Patient taking differently: Take 500 mg by mouth daily with breakfast. Dr. Marisue Humble fills this for pt) 15 tablet 0  . metoprolol succinate (TOPROL-XL) 100 MG 24 hr tablet Take 1 tablet (100 mg total) by mouth daily. NO MORE REFILLS WITHOUT OFFICE VISIT - 2ND NOTICE (Patient taking differently: Take 100 mg by mouth daily. Dr. Marisue Humble fills for pt) 15 tablet 0  . NON FORMULARY 5,000 mg daily. Biotin    . NON FORMULARY 50 mg daily. Zinc    . ONETOUCH VERIO test strip CHECK BS BID  7  . OXYBUTYNIN CHLORIDE PO Take 10 mg by mouth daily.    . potassium chloride (K-DUR) 10 MEQ tablet Take 10 mEq by mouth daily.    . rivaroxaban (XARELTO) 20 MG TABS tablet Take 1 tablet (20 mg total) by mouth daily with supper. 30 tablet 5   Current Facility-Administered Medications  Medication Dose Route Frequency Provider Last Rate Last Dose  . 0.9 %  sodium chloride infusion  500 mL Intravenous Continuous Gatha Mayer, MD        Allergies  Allergen Reactions  . Simvastatin     Myalgias  ?? lipitor     Review of Systems negative except from HPI and PMH  Physical Exam BP 110/72   Pulse 72   Ht '5\' 6"'  (1.676 m)   Wt 214 lb (97.1 kg)   BMI 34.54 kg/m  Well developed and nourished in no acute distress HENT normal Neck supple with JVP-flat Carotids brisk and full without bruits Clear Regular rate and rhythm, no murmurs or gallops Abd-soft with active BS without hepatomegaly No Clubbing cyanosis edema Skin-warm and dry A & Oriented  Grossly normal sensory and motor function   ECG demonstrates  Sinus '@72'  19/07/36  Assessment and  Plan  Hypertension     Atrial fibrillation-   Obesity   Family history of HCM   Blood pressure are low and have been at home.  May be related to her interval 15 pound weight loss.  We will decrease her  amlodipine from 10--5.  No interval atrial fibrillation of which she is aware  No bleeding on Xarelto  We will get blood work from her PCP to confirm dosing  She continues to exercise and has lost weight as noted   We spent more than 50% of our >25 min visit in face to face counseling regarding the above

## 2017-03-14 NOTE — Congregational Nurse Program (Signed)
Congregational Nurse Program Note  Date of Encounter: 03/14/2017  Past Medical History: Past Medical History:  Diagnosis Date  . Afib ([place])   . Arthritis    WRISTS AND NECK  . Benign positional vertigo   . Bloating   . Breast cancer ([place]) 2016   ER+/PR-/Her2-  . Colon polyps 12/2005  . Diabetes mellitus, type 2 ([place]) 06/2010  . Essential hypertension, benign   . Family history of hypertrophic cardiomyopathy   . Family history of ovarian cancer   . Family history of pancreatic cancer   . Focal nodular hyperplasia of liver    bx 2002  . Glaucoma   . Goiter 10/2006  . Hot flashes   . Hypothyroidism   . Migraines    menstrual  . Obesity   . OSA (obstructive sleep apnea)   . Other and unspecified hyperlipidemia   . Sleep apnea     Encounter Details: CNP Questionnaire - 03/14/17 1644      Questionnaire   Patient Status  Not Applicable    Race  Black or African American    Location Patient Served At  [place]    Uninsured  Not Applicable    Food  No food insecurities    Housing/Utilities  Yes, have permanent housing    Transportation  No transportation needs    Interpersonal Safety  Yes, feel physically and emotionally safe where you currently live    Medication  No medication insecurities    Medical Provider  Yes    Referrals  Not Applicable    ED Visit Averted  Not Applicable    Life-Saving Intervention Made  Not Applicable

## 2017-05-28 NOTE — Assessment & Plan Note (Signed)
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 Occasional hot flashes 2-3 times a week (attributes them to eating sweets ) Denies any arthralgias or myalgias.  Survivorship: Encouraged her to continue to exercise and stay active.  Surveillance for breast cancer:  1. Breast exam 05/28/2017: Benign scar tissue 2. [facility]ograms [facility]: August 2018 was benign 3.  Bone density 10/10/2016: T score 0.8: Normal  Return to clinic in 1 year for follow-up

## 2017-05-28 NOTE — Progress Notes (Signed)
Patient Care Team: Gaynelle Arabian, MD as PCP - General (Family Medicine) Jacelyn Pi, MD (Endocrinology) Inda Castle, MD (Inactive) (Gastroenterology) Vania Rea, MD (Obstetrics and Gynecology) Excell Seltzer, MD as Consulting Physician (General Surgery) Nicholas Lose, MD as Consulting Physician (Hematology and Oncology) Eppie Gibson, MD as Attending Physician (Radiation Oncology) Mauro Kaufmann, RN as Registered Nurse Rockwell Germany, RN as Registered Nurse Holley Bouche, NP as Nurse Practitioner (Nurse Practitioner) Sylvan Cheese, NP as Nurse Practitioner (Nurse Practitioner)  DIAGNOSIS:  Encounter Diagnosis  Name Primary?  . Malignant neoplasm of upper-outer quadrant of right breast in female, estrogen receptor positive ([place])     SUMMARY OF ONCOLOGIC HISTORY:   Breast cancer of upper-outer quadrant of right female breast ([place])   09/08/2014 Mammogram    Right breast: irregular mass with calcifications at 11:00, posterior depth.      09/15/2014 Breast US    Right breast: 1.6 cm irregular mass with indistinct margin at 11:00, posterior depth, 6 CFN. Related microcalcifications. Hypoechoic, correlates with mammographic findings.      09/16/2014 Initial Biopsy    Right breast biopsy: Invasive ductal carcinoma, grade 2, ER+ 100%, PR- 0%, HER-2 negative, Ki-67 10%      09/16/2014 Clinical Stage    Stage IA: T1c N0      09/23/2014 Procedure    Breast/Ovarian panel ([facility]) reveals no clinically signficant variant at ATM, BARD1, BRCA1, BRCA2, BRIP1, CDH1, CHEK2, EPCAM, FANCC, MLH1, MSH2, MSH6, NBN, PALB2, PMS2, PTEN, RAD51C, RAD51D, TP53, and XRCC2.      11/25/2014 Definitive Surgery    Right lumpectomy: IDC 1.7 cm Grade 2 with DCIS, 0/2 LN, ER+ 100, PR- 0%, HER 2 Neg       11/25/2014 Pathologic Stage    Stage IA: T1c N0      11/25/2014 Oncotype testing    Oncotype DX 18, 11% ROR      12/23/2014 - 01/28/2015 Radiation Therapy   Adjuvant RT: Right breast / 50 Gy in 25 fractions      03/02/2015 -  Anti-estrogen oral therapy    Anastrozole 1 mg daily 5 years      03/31/2015 Survivorship    Survivorship visit completed and copy of care plan given to patient       CHIEF COMPLIANT: Follow-up on anastrozole therapy  INTERVAL HISTORY: Jessica Mendoza is a 62 year old with above-mentioned history of right breast cancer treated with lumpectomy followed by adjuvant radiation and is currently on anastrozole therapy.  She appears to be tolerating anastrozole fairly well.  She does have intermittent hot flashes.  Denies any arthralgias or myalgias.  Bone density test done in August 2018 did not show any evidence of bone weakness.  She had in fact had a T score of 0.8 which is in the normal range.  REVIEW OF SYSTEMS:   Constitutional: Denies fevers, chills or abnormal weight loss Eyes: Denies blurriness of vision Ears, nose, mouth, throat, and face: Denies mucositis or sore throat Respiratory: Denies cough, dyspnea or wheezes Cardiovascular: Denies palpitation, chest discomfort Gastrointestinal:  Denies nausea, heartburn or change in bowel habits Skin: Denies abnormal skin rashes Lymphatics: Denies new lymphadenopathy or easy bruising Neurological:Denies numbness, tingling or new weaknesses Behavioral/Psych: Mood is stable, no new changes  Extremities: No lower extremity edema Breast:  denies any pain or lumps or nodules in either breasts All other systems were reviewed with the patient and are negative.  I have reviewed the past medical history, past surgical history, social history  and family history with the patient and they are unchanged from previous note.  ALLERGIES:  is allergic to simvastatin.  MEDICATIONS:  Current Outpatient Medications  Medication Sig Dispense Refill  . amLODipine (NORVASC) 5 MG tablet Take 1 tablet (5 mg total) by mouth daily. 90 tablet 3  . anastrozole (ARIMIDEX) 1 MG tablet TAKE 1  TABLET BY MOUTH  DAILY 90 tablet 3  . Cholecalciferol (VITAMIN D-3) 1000 UNITS CAPS Take 1 capsule by mouth 3 (three) times a week.    . Coenzyme Q10 (CO Q 10) 100 MG CAPS Take by mouth daily.     . Collagen Hydrolysate POWD 1 scoop by Does not apply route daily.  0  . Cyanocobalamin (B-12) 1000 MCG SUBL Place 1 mL under the tongue daily. 30 each   . levothyroxine (SYNTHROID, LEVOTHROID) 25 MCG tablet Take 25 mcg by mouth daily. Dr Suzette Battiest sees pt for thyroid    . LIVALO 1 MG TABS Take 1 tablet by mouth every other day.  0  . losartan-hydrochlorothiazide (HYZAAR) 100-12.5 MG per tablet Take 1 tablet by mouth daily. NO MORE REFILLS WITHOUT OFFICE VISIT - 2ND NOTICE (Patient taking differently: Take 1 tablet by mouth daily. Dr. Marisue Humble fills this for pt) 15 tablet 0  . metFORMIN (GLUCOPHAGE-XR) 500 MG 24 hr tablet Take 1 tablet (500 mg total) by mouth daily with breakfast. NO MORE REFILLS WITHOUT OFFICE VISIT - 2ND NOTICE (Patient taking differently: Take 500 mg by mouth daily with breakfast. Dr. Marisue Humble fills this for pt) 15 tablet 0  . metoprolol succinate (TOPROL-XL) 100 MG 24 hr tablet Take 1 tablet (100 mg total) by mouth daily. NO MORE REFILLS WITHOUT OFFICE VISIT - 2ND NOTICE (Patient taking differently: Take 100 mg by mouth daily. Dr. Marisue Humble fills for pt) 15 tablet 0  . NON FORMULARY 5,000 mg daily. Biotin    . NON FORMULARY 50 mg daily. Zinc    . ONETOUCH VERIO test strip CHECK BS BID  7  . OXYBUTYNIN CHLORIDE PO Take 10 mg by mouth daily.    . potassium chloride (K-DUR) 10 MEQ tablet Take 10 mEq by mouth daily.    . rivaroxaban (XARELTO) 20 MG TABS tablet Take 1 tablet (20 mg total) by mouth daily with supper. 30 tablet 5   Current Facility-Administered Medications  Medication Dose Route Frequency Provider Last Rate Last Dose  . 0.9 %  sodium chloride infusion  500 mL Intravenous Continuous Gatha Mayer, MD        PHYSICAL EXAMINATION: ECOG PERFORMANCE STATUS: 1 - Symptomatic but  completely ambulatory  Vitals:   05/29/17 0820  BP: 126/82  Pulse: 82  Resp: 17  Temp: 97.7 F (36.5 C)  SpO2: 98%   Filed Weights   05/29/17 0820  Weight: 219 lb 8 oz (99.6 kg)    GENERAL:alert, no distress and comfortable SKIN: skin color, texture, turgor are normal, no rashes or significant lesions EYES: normal, Conjunctiva are pink and non-injected, sclera clear OROPHARYNX:no exudate, no erythema and lips, buccal mucosa, and tongue normal  NECK: supple, thyroid normal size, non-tender, without nodularity LYMPH:  no palpable lymphadenopathy in the cervical, axillary or inguinal LUNGS: clear to auscultation and percussion with normal breathing effort HEART: regular rate & rhythm and no murmurs and no lower extremity edema ABDOMEN:abdomen soft, non-tender and normal bowel sounds MUSCULOSKELETAL:no cyanosis of digits and no clubbing  NEURO: alert & oriented x 3 with fluent speech, no focal motor/sensory deficits EXTREMITIES: No lower extremity edema BREAST:  No palpable masses or nodules in either right or left breasts. No palpable axillary supraclavicular or infraclavicular adenopathy no breast tenderness or nipple discharge. (exam performed in the presence of a chaperone)  LABORATORY DATA:  I have reviewed the data as listed CMP Latest Ref Rng & Units 11/19/2014 09/22/2014 04/07/2013  Glucose 65 - 99 mg/dL 158(H) 116 118(H)  BUN 6 - 20 mg/dL 9 11.9 9  Creatinine 0.44 - 1.00 mg/dL 0.89 1.1 0.79  Sodium 135 - 145 mmol/L 138 140 142  Potassium 3.5 - 5.1 mmol/L 4.1 3.6 4.0  Chloride 101 - 111 mmol/L 100(L) - 102  CO2 22 - 32 mmol/L '29 29 29  ' Calcium 8.9 - 10.3 mg/dL 9.0 9.3 9.2  Total Protein 6.4 - 8.3 g/dL - 7.4 7.2  Total Bilirubin 0.20 - 1.20 mg/dL - 0.64 0.8  Alkaline Phos 40 - 150 U/L - 87 74  AST 5 - 34 U/L - 37(H) 23  ALT 0 - 55 U/L - 61(H) 38(H)    Lab Results  Component Value Date   WBC 4.5 02/13/2017   HGB 12.0 02/13/2017   HCT 35.9 02/13/2017   MCV 83  02/13/2017   PLT 230 02/13/2017   NEUTROABS 2.2 02/13/2017    ASSESSMENT & PLAN:  Breast cancer of upper-outer quadrant of right female breast ([place]) Rt Lumpectomy 11/25/14: IDC 1.7 cm Grade 2 with DCIS , 0/2 LN, ER 100, PR 0%, Her 2 Neg T1CN0 (Stage 1A) Oncotype DX 18, 11% risk of recurrence Adjuvant radiation therapy 12/23/2014 to 01/28/2015  Current treatment: Adjuvant antiestrogen therapy with anastrozole 1 mg daily 5 years started 03/02/2015  Anastrozole toxicities: 1.  Hot flashes have resolved 2. wall occasional arthralgias and myalgias in the hands  Survivorship: Encouraged her to continue to exercise and stay active.  Surveillance for breast cancer:  1. Breast exam 05/28/2017: Benign scar tissue 2. mammograms at [facility]: August 2018 was benign 3.  Bone density 10/10/2016: T score 0.8: Normal  Return to clinic in 1 year for follow-up  No orders of the defined types were placed in this encounter.  The patient has a good understanding of the overall plan. she agrees with it. she will call with any problems that may develop before the next visit here.   Harriette Ohara, MD 05/29/17

## 2017-05-29 ENCOUNTER — Telehealth: Payer: Self-pay | Admitting: Hematology and Oncology

## 2017-05-29 ENCOUNTER — Inpatient Hospital Stay: Payer: 59 | Attending: Hematology and Oncology | Admitting: Hematology and Oncology

## 2017-05-29 DIAGNOSIS — Z79811 Long term (current) use of aromatase inhibitors: Secondary | ICD-10-CM | POA: Diagnosis not present

## 2017-05-29 DIAGNOSIS — Z923 Personal history of irradiation: Secondary | ICD-10-CM | POA: Diagnosis not present

## 2017-05-29 DIAGNOSIS — Z7901 Long term (current) use of anticoagulants: Secondary | ICD-10-CM | POA: Diagnosis not present

## 2017-05-29 DIAGNOSIS — N951 Menopausal and female climacteric states: Secondary | ICD-10-CM | POA: Diagnosis not present

## 2017-05-29 DIAGNOSIS — E119 Type 2 diabetes mellitus without complications: Secondary | ICD-10-CM | POA: Diagnosis not present

## 2017-05-29 DIAGNOSIS — Z170 Estrogen receptor positive status [ER+]: Secondary | ICD-10-CM | POA: Diagnosis not present

## 2017-05-29 DIAGNOSIS — Z7984 Long term (current) use of oral hypoglycemic drugs: Secondary | ICD-10-CM | POA: Insufficient documentation

## 2017-05-29 DIAGNOSIS — C50411 Malignant neoplasm of upper-outer quadrant of right female breast: Secondary | ICD-10-CM | POA: Diagnosis not present

## 2017-05-29 DIAGNOSIS — Z79899 Other long term (current) drug therapy: Secondary | ICD-10-CM | POA: Diagnosis not present

## 2017-05-29 MED ORDER — ESTRADIOL 0.1 MG/GM VA CREA: 1.0000 | TOPICAL_CREAM | Freq: Every day | VAGINAL | 12 refills | Status: DC

## 2017-05-29 MED ORDER — ANASTROZOLE 1 MG PO TABS: 1.0000 mg | ORAL_TABLET | Freq: Every day | ORAL | 3 refills | Status: DC

## 2017-05-29 MED ORDER — AMLODIPINE BESYLATE 5 MG PO TABS: 2.5000 mg | ORAL_TABLET | Freq: Every day | ORAL | 3 refills | Status: DC

## 2017-05-29 NOTE — Telephone Encounter (Signed)
Gave patient AVs and calendar of upcoming April 2020 appointments.  °

## 2017-07-10 ENCOUNTER — Other Ambulatory Visit: Payer: Self-pay | Admitting: Internal Medicine

## 2017-07-10 DIAGNOSIS — I480 Paroxysmal atrial fibrillation: Secondary | ICD-10-CM

## 2017-07-10 DIAGNOSIS — Z8249 Family history of ischemic heart disease and other diseases of the circulatory system: Secondary | ICD-10-CM

## 2017-07-10 NOTE — Telephone Encounter (Signed)
Xarelto 20mg  refill request received; pt is 62 yrs old, wt-99.6kg, Crea-0.83 per [facility] on 01/16/17, last seen by Dr. Caryl Comes on 02/13/17,CrCl-110.5 ml/min; will send in refill to requested pharmacy.

## 2017-07-31 DIAGNOSIS — E119 Type 2 diabetes mellitus without complications: Secondary | ICD-10-CM | POA: Diagnosis not present

## 2017-07-31 DIAGNOSIS — E7800 Pure hypercholesterolemia, unspecified: Secondary | ICD-10-CM | POA: Diagnosis not present

## 2017-07-31 DIAGNOSIS — I10 Essential (primary) hypertension: Secondary | ICD-10-CM | POA: Diagnosis not present

## 2017-08-02 IMAGING — US US THYROID
1 series · 13 of 25 positions shown · non-contrast
Comparison: 02/19/2014

CLINICAL DATA: Thyroid goiter

EXAM:
THYROID ULTRASOUND
TECHNIQUE: Ultrasound examination of the thyroid gland and adjacent soft
tissues was performed.

[Series 1: us thyroid · 0.11mm/px · 13 of 69 slices shown]
[im 1/69]
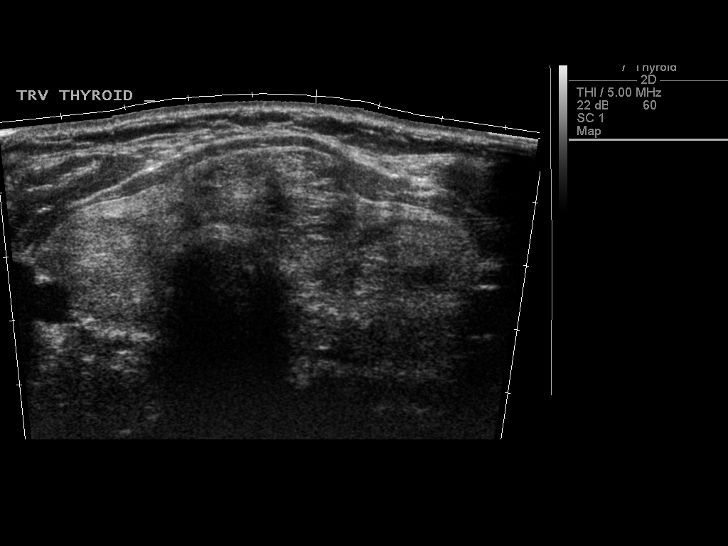
[im 6/69]
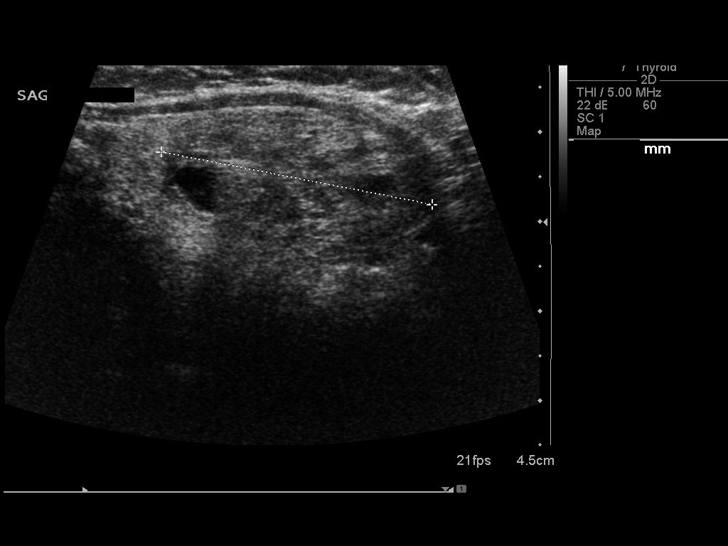
[im 12/69]
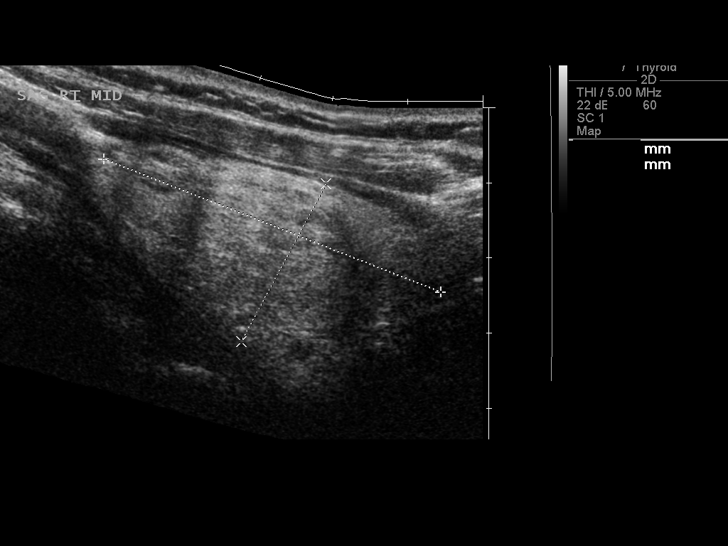
[im 18/69]
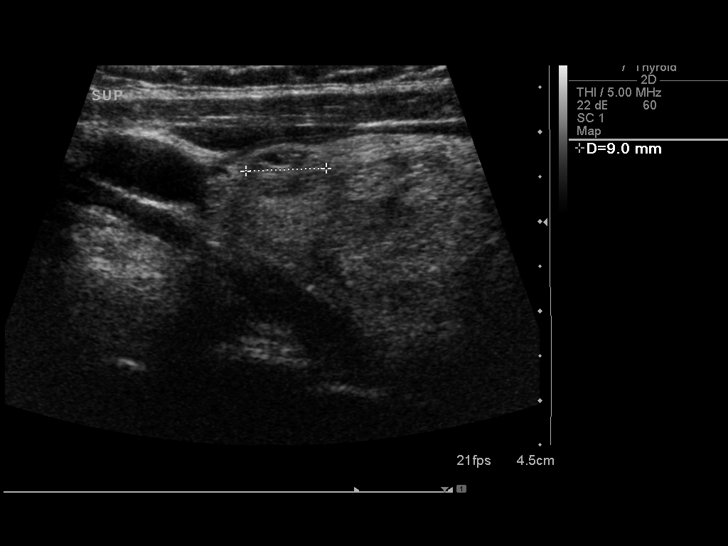
[im 23/69]
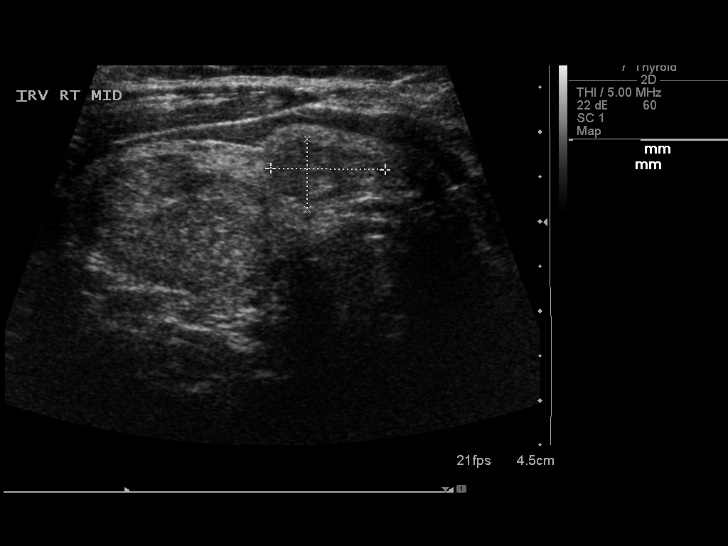
[im 29/69]
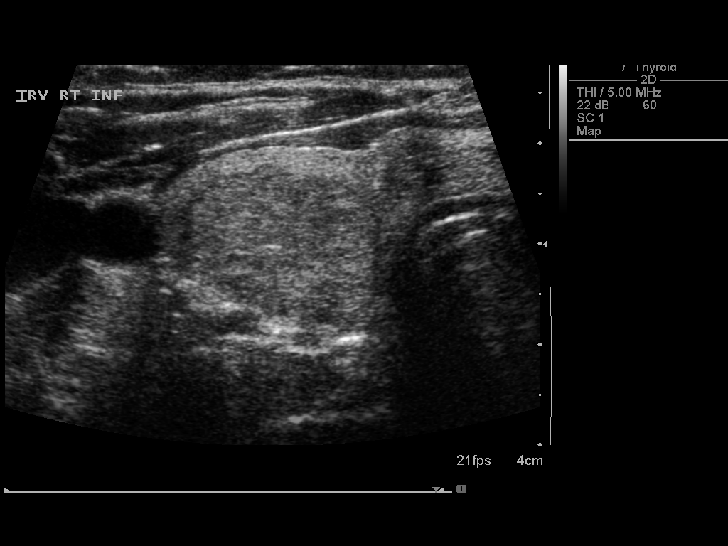
[im 35/69]
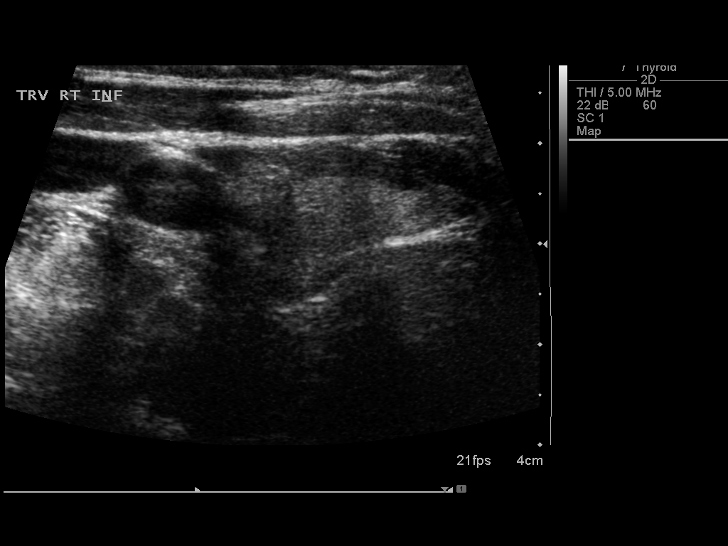
[im 40/69]
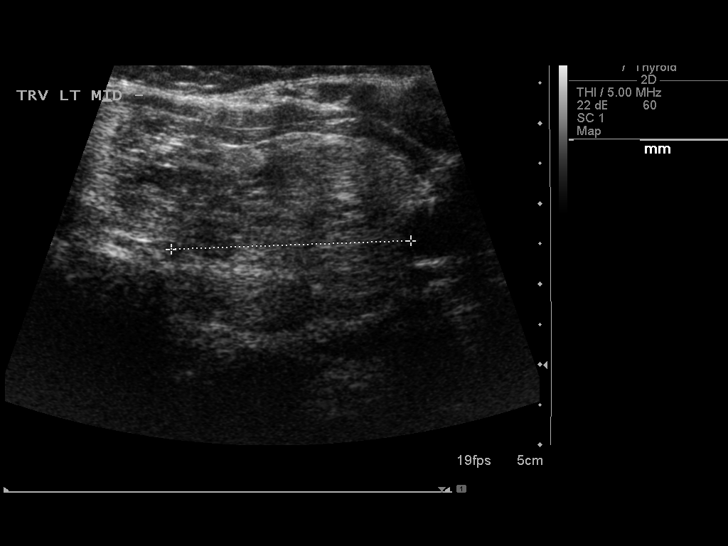
[im 46/69]
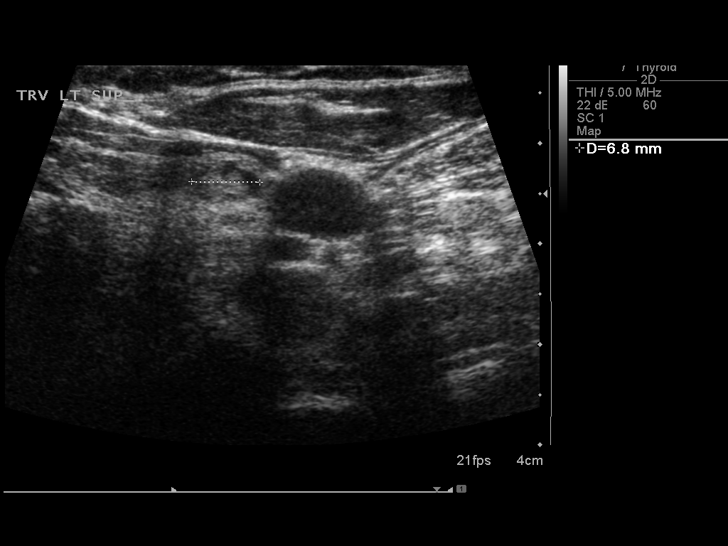
[im 52/69]
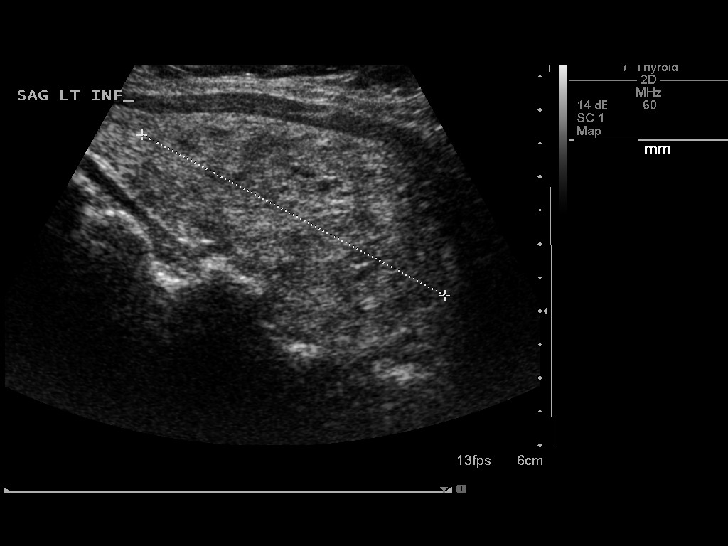
[im 57/69]
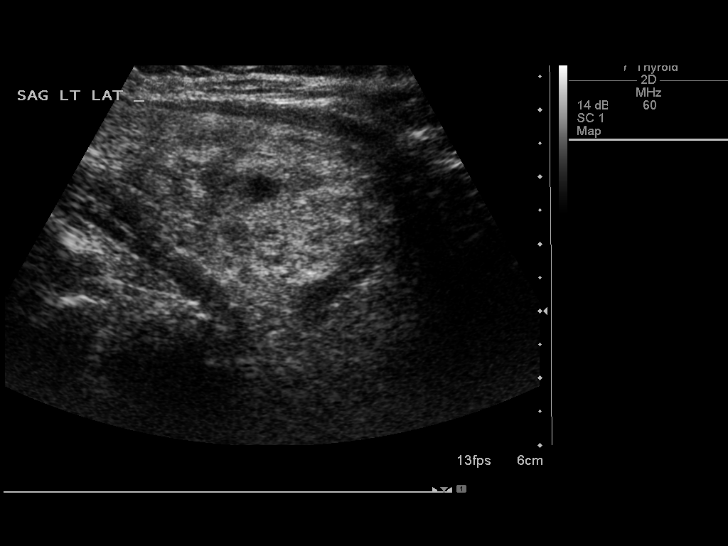
[im 63/69]
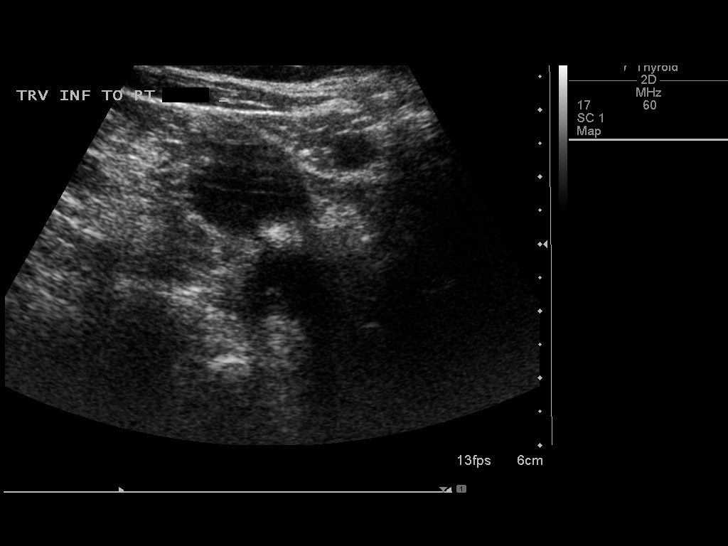
[im 69/69]
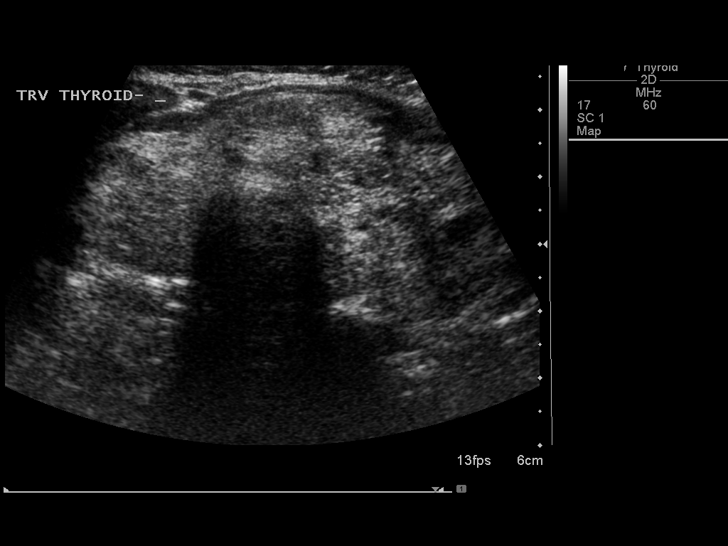

[13 of 25 positions shown; findings below may reference images not displayed]

FINDINGS: Right thyroid lobe

Measurements: 4.8 x 2.4 x 2.5 cm previously 5.9 x 2.7 x 2.6 cm. The
following nodules were identified:

1. 0.9 x 0.6 x 0.9 cm upper pole circumscribed solid centrally
echogenic noncalcified nodule versus previously 0.8 cm in diameter

2. 1.3 x 0.8 x 1.3 cm interpolar circumscribed solid, anterior,
hypoechoic to slightly hyperechoic, noncalcified nodule with
peripheral an intra nodular flow versus previously 1.1 cm in
diameter

3. 2.4 x 1.6 x 2 cm lower pole solid mixed echogenic nodule with
peripheral and intranodular flow previously 2 x 1.6 x 2.3 cm.

Left thyroid lobe

Measurements: 6.2 x 2.7 x 3 cm previously 6.3 x 2.9 x 3.4 cm. The
following nodules were identified:

1.  0.7 cm in diameter upper pole, no comparison seen at prior

2.  0.7 cm in diameter upper pole, no comparison seen on prior

3. 5.1 x 2.2 x 2.5 cm mid to lower pole solid, hypoechoic,
circumscribed, noncalcified nodule with intranodular and peripheral
flow versus 4.4 x 2.7 x 3.1 cm.

New

Isthmus

Thickness: 1.5 cm previously 1.6 cm go and. There is a 3.1 x 1.4 x
2.8 cm mid to slightly left-sided solid nodule with peripheral and
intra nodular flow demonstrating internal areas of hypo and
hyperechogenicity. No calcifications are identified. Previously this
measured 2.7 x 1.6 x 2.8 cm.

Lymphadenopathy

None visualized.
IMPRESSION: Multinodular goiter. Slight increase in size of right-sided nodules.
Two small subcentimeter left upper pole thyroid nodule since prior.
No significant change of dominant left lower pole nodule.

## 2017-09-18 DIAGNOSIS — G4733 Obstructive sleep apnea (adult) (pediatric): Secondary | ICD-10-CM | POA: Diagnosis not present

## 2017-10-02 ENCOUNTER — Other Ambulatory Visit: Payer: Self-pay | Admitting: Endocrinology

## 2017-10-02 DIAGNOSIS — E049 Nontoxic goiter, unspecified: Secondary | ICD-10-CM

## 2017-10-15 ENCOUNTER — Other Ambulatory Visit: Payer: Self-pay | Admitting: Radiology

## 2017-10-15 DIAGNOSIS — N6011 Diffuse cystic mastopathy of right breast: Secondary | ICD-10-CM | POA: Diagnosis not present

## 2017-10-15 DIAGNOSIS — N6031 Fibrosclerosis of right breast: Secondary | ICD-10-CM | POA: Diagnosis not present

## 2017-10-15 DIAGNOSIS — R928 Other abnormal and inconclusive findings on diagnostic imaging of breast: Secondary | ICD-10-CM | POA: Diagnosis not present

## 2017-10-15 DIAGNOSIS — N6311 Unspecified lump in the right breast, upper outer quadrant: Secondary | ICD-10-CM | POA: Diagnosis not present

## 2017-10-15 DIAGNOSIS — N641 Fat necrosis of breast: Secondary | ICD-10-CM | POA: Diagnosis not present

## 2017-10-23 ENCOUNTER — Ambulatory Visit
Admission: RE | Admit: 2017-10-23 | Discharge: 2017-10-23 | Disposition: A | Discharge status: Home / Self Care | Payer: 59 | Source: Ambulatory Visit | Attending: Endocrinology | Admitting: Endocrinology

## 2017-10-23 DIAGNOSIS — E049 Nontoxic goiter, unspecified: Secondary | ICD-10-CM

## 2017-10-23 DIAGNOSIS — E042 Nontoxic multinodular goiter: Secondary | ICD-10-CM | POA: Diagnosis not present

## 2017-10-30 ENCOUNTER — Encounter: Payer: Self-pay | Admitting: Podiatry

## 2017-10-30 ENCOUNTER — Ambulatory Visit: Payer: 59 | Admitting: Podiatry

## 2017-10-30 ENCOUNTER — Other Ambulatory Visit: Payer: Self-pay | Admitting: Podiatry

## 2017-10-30 ENCOUNTER — Ambulatory Visit (INDEPENDENT_AMBULATORY_CARE_PROVIDER_SITE_OTHER): Payer: 59

## 2017-10-30 ENCOUNTER — Other Ambulatory Visit: Payer: Self-pay

## 2017-10-30 VITALS — BP 98/63 | HR 83

## 2017-10-30 DIAGNOSIS — M659 Synovitis and tenosynovitis, unspecified: Secondary | ICD-10-CM

## 2017-10-30 DIAGNOSIS — E049 Nontoxic goiter, unspecified: Secondary | ICD-10-CM | POA: Diagnosis not present

## 2017-10-30 DIAGNOSIS — M25572 Pain in left ankle and joints of left foot: Principal | ICD-10-CM

## 2017-10-30 DIAGNOSIS — Z860100 Personal history of colon polyps, unspecified: Secondary | ICD-10-CM | POA: Insufficient documentation

## 2017-10-30 DIAGNOSIS — R600 Localized edema: Secondary | ICD-10-CM

## 2017-10-30 DIAGNOSIS — M65979 Unspecified synovitis and tenosynovitis, unspecified ankle and foot: Secondary | ICD-10-CM

## 2017-10-30 DIAGNOSIS — B351 Tinea unguium: Secondary | ICD-10-CM

## 2017-10-30 DIAGNOSIS — M65971 Unspecified synovitis and tenosynovitis, right ankle and foot: Secondary | ICD-10-CM

## 2017-10-30 DIAGNOSIS — H409 Unspecified glaucoma: Secondary | ICD-10-CM | POA: Insufficient documentation

## 2017-10-30 DIAGNOSIS — M25571 Pain in right ankle and joints of right foot: Secondary | ICD-10-CM

## 2017-10-31 ENCOUNTER — Telehealth: Payer: Self-pay | Admitting: Podiatry

## 2017-10-31 NOTE — Telephone Encounter (Signed)
Pt received a shot yesterday and didn't know what type. She would like to know what type of medication it was. Please give her call.

## 2017-11-02 NOTE — Progress Notes (Signed)
   Subjective: 62 year old female with PMHx of T2DM presenting today as a new patient with a chief complaint of bilateral ankle pain and thickened, discolored toenails of bilateral feet. She states her symptoms have been present for several months and she has not done anything for treatment. Walking and standing for long periods of time increases the ankle pain. Patient is here for further evaluation and treatment.   Past Medical History:  Diagnosis Date  . Afib ([place])   . Arthritis    WRISTS AND NECK  . Benign positional vertigo   . Bloating   . Breast cancer ([place]) 2016   ER+/PR-/Her2-  . Colon polyps 12/2005  . Diabetes mellitus, type 2 ([place]) 06/2010  . Essential hypertension, benign   . Family history of hypertrophic cardiomyopathy   . Family history of ovarian cancer   . Family history of pancreatic cancer   . Focal nodular hyperplasia of liver    bx 2002  . Glaucoma   . Goiter 10/2006  . Hot flashes   . Hypothyroidism   . Migraines    menstrual  . Obesity   . OSA (obstructive sleep apnea)   . Other and unspecified hyperlipidemia   . Sleep apnea     Objective: Physical Exam General: The patient is alert and oriented x3 in no acute distress.  Dermatology: Hyperkeratotic, discolored, thickened, onychodystrophy of great toenails noted bilaterally. Skin is warm, dry and supple bilateral lower extremities. Negative for open lesions or macerations.  Vascular: Edema noted to bilateral lower extremities. Palpable pedal pulses bilaterally. Capillary refill within normal limits.  Neurological: Epicritic and protective threshold diminished bilaterally.   Musculoskeletal Exam: Pain with palpation to the anterior, lateral and medial aspects of the right ankle joint. Range of motion within normal limits to all pedal and ankle joints bilateral. Muscle strength 5/5 in all groups bilateral.   Radiographic Exam:  Normal osseous mineralization. Joint spaces preserved. No  fracture/dislocation/boney destruction.    Assessment: #1 Ankle synovitis right #2 onychomycosis bilateral great toenails #3 BLE edema   Plan of Care:  #1 Patient was evaluated. X-Rays reviewed.  #2 Injection of 0.5 mLs Celestone Soluspan injected into the right ankle joint.  #3 Appointment with Janett Billow for fungal nail laser treatment.  #4 Recommended OTC compression hose up to the knees.  #5 Return to clinic as needed.    Edrick Kins, DPM [facility]  Dr. Edrick Kins, [address]                                        [place], [place] [address]                Office [phone]  Fax [phone]

## 2017-11-04 NOTE — Telephone Encounter (Signed)
Left message informing pt the medication injected was celestone a steroid.

## 2017-11-12 DIAGNOSIS — Z124 Encounter for screening for malignant neoplasm of cervix: Secondary | ICD-10-CM | POA: Diagnosis not present

## 2017-11-12 DIAGNOSIS — Z6837 Body mass index (BMI) 37.0-37.9, adult: Secondary | ICD-10-CM | POA: Diagnosis not present

## 2017-11-12 DIAGNOSIS — Z01419 Encounter for gynecological examination (general) (routine) without abnormal findings: Secondary | ICD-10-CM | POA: Diagnosis not present

## 2017-11-27 LAB — GLUCOSE, POCT (MANUAL RESULT ENTRY): POC Glucose: 81 mg/dl (ref 70–99)

## 2017-11-28 DIAGNOSIS — G4733 Obstructive sleep apnea (adult) (pediatric): Secondary | ICD-10-CM | POA: Diagnosis not present

## 2017-12-30 ENCOUNTER — Other Ambulatory Visit: Payer: Self-pay

## 2017-12-30 DIAGNOSIS — Z8249 Family history of ischemic heart disease and other diseases of the circulatory system: Secondary | ICD-10-CM

## 2017-12-30 DIAGNOSIS — I480 Paroxysmal atrial fibrillation: Secondary | ICD-10-CM

## 2017-12-30 MED ORDER — RIVAROXABAN 20 MG PO TABS: 20.0000 mg | ORAL_TABLET | Freq: Every day | ORAL | 5 refills | Status: DC

## 2017-12-30 NOTE — Telephone Encounter (Signed)
Pt last saw Dr Caryl Comes 02/13/17, last labs 07/31/17 Creat 0.84 at [place], age 62, weight 99.6kg, CrCl 107.78, based on CrCl pt is on appropriate dosage of Xarelto 20mg  QD.  Will refill rx.

## 2018-01-15 DIAGNOSIS — M67911 Unspecified disorder of synovium and tendon, right shoulder: Secondary | ICD-10-CM | POA: Diagnosis not present

## 2018-01-29 DIAGNOSIS — B353 Tinea pedis: Secondary | ICD-10-CM | POA: Diagnosis not present

## 2018-01-29 DIAGNOSIS — L821 Other seborrheic keratosis: Secondary | ICD-10-CM | POA: Diagnosis not present

## 2018-01-29 DIAGNOSIS — D225 Melanocytic nevi of trunk: Secondary | ICD-10-CM | POA: Diagnosis not present

## 2018-02-19 ENCOUNTER — Other Ambulatory Visit: Payer: 59

## 2018-02-27 ENCOUNTER — Other Ambulatory Visit: Payer: Self-pay | Admitting: Internal Medicine

## 2018-03-05 ENCOUNTER — Ambulatory Visit: Payer: 59 | Admitting: Nurse Practitioner

## 2018-03-05 ENCOUNTER — Encounter: Payer: Self-pay | Admitting: Nurse Practitioner

## 2018-03-05 VITALS — BP 112/76 | HR 87 | Ht 66.0 in | Wt 210.2 lb

## 2018-03-05 DIAGNOSIS — I480 Paroxysmal atrial fibrillation: Secondary | ICD-10-CM

## 2018-03-05 DIAGNOSIS — E1169 Type 2 diabetes mellitus with other specified complication: Secondary | ICD-10-CM | POA: Diagnosis not present

## 2018-03-05 DIAGNOSIS — Z8249 Family history of ischemic heart disease and other diseases of the circulatory system: Secondary | ICD-10-CM

## 2018-03-05 DIAGNOSIS — I10 Essential (primary) hypertension: Secondary | ICD-10-CM

## 2018-03-05 DIAGNOSIS — Z1159 Encounter for screening for other viral diseases: Secondary | ICD-10-CM | POA: Diagnosis not present

## 2018-03-05 DIAGNOSIS — Z0000 Encounter for general adult medical examination without abnormal findings: Secondary | ICD-10-CM | POA: Diagnosis not present

## 2018-03-05 DIAGNOSIS — E7800 Pure hypercholesterolemia, unspecified: Secondary | ICD-10-CM | POA: Diagnosis not present

## 2018-03-05 DIAGNOSIS — E049 Nontoxic goiter, unspecified: Secondary | ICD-10-CM | POA: Diagnosis not present

## 2018-03-05 MED ORDER — RIVAROXABAN 20 MG PO TABS: 20.0000 mg | ORAL_TABLET | Freq: Every day | ORAL | 3 refills | Status: DC

## 2018-03-05 NOTE — Patient Instructions (Signed)
Medication Instructions:  none If you need a refill on your cardiac medications before your next appointment, please call your pharmacy.   Lab work:PLEASE HAVE Doctor Office Fax Lab Results:  Chanetta Marshall [phone] none If you have labs (blood work) drawn today and your tests are completely normal, you will receive your results only by: Marland Kitchen MyChart Message (if you have MyChart) OR . A paper copy in the mail If you have any lab test that is abnormal or we need to change your treatment, we will call you to review the results.  Testing/Procedures: none  Follow-Up: At [facility], you and your health needs are our priority.  As part of our continuing mission to provide you with exceptional heart care, we have created designated Provider Care Teams.  These Care Teams include your primary Cardiologist (physician) and Advanced Practice Providers (APPs -  Physician Assistants and Nurse Practitioners) who all work together to provide you with the care you need, when you need it. You will need a follow up appointment in 1 years.  Please call our office 2 months in advance to schedule this appointment.  You may see Dr Caryl Comes or one of the following Advanced Practice Providers on your designated Care Team:   Chanetta Marshall, NP . Tommye Standard, PA-C  Any Other Special Instructions Will Be Listed Below (If Applicable).

## 2018-03-05 NOTE — Progress Notes (Signed)
Electrophysiology Office Note Date: 03/05/2018  ID:  Jessica Mendoza 07/12/1955, MRN [id number]  PCP: Blair Heys, MD Electrophysiologist: Jessica Mendoza  CC: AF follow up  Jessica Mendoza is a 63 y.o. female seen today for Dr Jessica Mendoza.  She presents today for routine electrophysiology followup.  Since last being seen in our clinic, the patient reports doing very well. She is working out with the [employer] 3 days per week. She is doing aerobics, strength training, and balance. She denies chest pain, palpitations, dyspnea, PND, orthopnea, nausea, vomiting, dizziness, syncope, edema, weight gain, or early satiety.  Past Medical History:  Diagnosis Date  . Afib (HCC)   . Arthritis    WRISTS AND NECK  . Benign positional vertigo   . Bloating   . Breast cancer (HCC) 2016   ER+/PR-/Her2-  . Colon polyps 12/2005  . Diabetes mellitus, type 2 (HCC) 06/2010  . Essential hypertension, benign   . Family history of hypertrophic cardiomyopathy   . Family history of ovarian cancer   . Family history of pancreatic cancer   . Focal nodular hyperplasia of liver    bx 2002  . Glaucoma   . Goiter 10/2006  . Hot flashes   . Hypothyroidism   . Migraines    menstrual  . Obesity   . OSA (obstructive sleep apnea)   . Other and unspecified hyperlipidemia   . Sleep apnea    Past Surgical History:  Procedure Laterality Date  . BREAST LUMPECTOMY     right breast- benign  . BREAST LUMPECTOMY WITH RADIOACTIVE SEED AND SENTINEL LYMPH NODE BIOPSY Right 11/25/2014   Procedure: RIGHT BREAST LUMPECTOMY WITH RADIOACTIVE SEED AND RIGHT AXILLARY SENTINEL LYMPH NODE BIOPSY;  Surgeon: Glenna Fellows, MD;  Location: [facility];  Service: General;  Laterality: Right;  . CARDIOVERSION N/A 07/11/2012   Procedure: CARDIOVERSION;  Surgeon: Laurey Morale, MD;  Location: [facility] END[facility]COPY;  Service: Cardiovascular;  Laterality: N/A;  . LIVER BIOPSY  2002    Current Outpatient  Medications  Medication Sig Dispense Refill  . albuterol (PROVENTIL HFA) 108 (90 Base) MCG/ACT inhaler Proventil HFA 90 mcg/actuation aerosol inhaler  INHALE 2 PUFFS INTO THE LUNGS Q 6 H PRF WHEEZING OR SHORTNESS OF BREATH    . amLODipine (NORVASC) 5 MG tablet TAKE 1 TABLET BY MOUTH  DAILY 90 tablet 3  . anastrozole (ARIMIDEX) 1 MG tablet Take 1 tablet (1 mg total) by mouth daily. 90 tablet 3  . Cholecalciferol (VITAMIN D-3) 1000 UNITS CAPS Take 1 capsule by mouth 3 (three) times a week.    . Coenzyme Q10 (CO Q 10) 100 MG CAPS Take by mouth daily.     . Collagen Hydrolysate POWD 1 scoop by Does not apply route daily.  0  . Cyanocobalamin (B-12) 1000 MCG SUBL Place 1 mL under the tongue daily. 30 each   . estradiol (ESTRACE VAGINAL) 0.1 MG/GM vaginal cream Place 1 Applicatorful vaginally at bedtime. 42.5 g 12  . levothyroxine (SYNTHROID, LEVOTHROID) 25 MCG tablet Take 25 mcg by mouth daily. Dr Horald Pollen sees pt for thyroid    . LIVALO 1 MG TABS Take 1 tablet by mouth every other day.  0  . metFORMIN (GLUCOPHAGE-XR) 500 MG 24 hr tablet Take 1 tablet (500 mg total) by mouth daily with breakfast. NO MORE REFILLS WITHOUT OFFICE VISIT - 2ND NOTICE (Patient taking differently: Take 500 mg by mouth daily with breakfast. Dr. Manus Gunning fills this for pt) 15 tablet  0  . metoprolol succinate (TOPROL-XL) 100 MG 24 hr tablet Take 1 tablet (100 mg total) by mouth daily. NO MORE REFILLS WITHOUT OFFICE VISIT - 2ND NOTICE (Patient taking differently: Take 100 mg by mouth daily. Dr. Manus Gunning fills for pt) 15 tablet 0  . NON FORMULARY 5,000 mg daily. Biotin    . NON FORMULARY 50 mg daily. Zinc    . ONETOUCH VERIO test strip CHECK BS BID  7  . OXYBUTYNIN CHLORIDE PO Take 10 mg by mouth daily.    . potassium chloride (K-DUR) 10 MEQ tablet Take 10 mEq by mouth daily.    . potassium chloride (K-DUR,KLOR-CON) 10 MEQ tablet potassium chloride ER 10 mEq tablet,extended release(part/cryst)    . rivaroxaban (XARELTO) 20 MG TABS  tablet Take 1 tablet (20 mg total) by mouth daily with supper. 30 tablet 5   Current Facility-Administered Medications  Medication Dose Route Frequency Provider Last Rate Last Dose  . 0.9 %  sodium chloride infusion  500 mL Intravenous Continuous Iva Boop, MD        Allergies:   Simvastatin and Statins   Social History: Social History   Socioeconomic History  . Marital status: Married    Spouse name: Cephas  . Number of children: 1  . Years of education: Not on file  . Highest education level: Not on file  Occupational History  . Occupation: Manufacturing engineer - part-time  Social Needs  . Financial resource strain: Not on file  . Food insecurity:    Worry: Not on file    Inability: Not on file  . Transportation needs:    Medical: Not on file    Non-medical: Not on file  Tobacco Use  . Smoking status: Former Smoker    Packs/day: 0.50    Years: 25.00    Pack years: 12.50    Types: Cigarettes    Last attempt to quit: 02/13/1988    Years since quitting: 30.0  . Smokeless tobacco: Never Used  Substance and Sexual Activity  . Alcohol use: Yes    Comment: occas  . Drug use: No  . Sexual activity: Yes    Birth control/protection: None  Lifestyle  . Physical activity:    Days per week: Not on file    Minutes per session: Not on file  . Stress: Not on file  Relationships  . Social connections:    Talks on phone: Not on file    Gets together: Not on file    Attends religious service: Not on file    Active member of club or organization: Not on file    Attends meetings of clubs or organizations: Not on file    Relationship status: Not on file  . Intimate partner violence:    Fear of current or ex partner: Not on file    Emotionally abused: Not on file    Physically abused: Not on file    Forced sexual activity: Not on file  Other Topics Concern  . Not on file  Social History Narrative  . Not on file    Family History: Family History  Problem Relation  Age of Onset  . Hypertension Mother   . Heart attack Mother   . Alcohol abuse Father   . Hypertension Brother   . Hyperlipidemia Brother   . Ovarian cancer Maternal Aunt 56  . Pancreatic cancer Maternal Grandfather 77  . Breast cancer Cousin 23       Mother's paternal first cousin, had breast  cancer again at 60  . Ovarian cancer Cousin 67       "groin cancer"  . Hypertension Paternal Grandmother   . Diabetes Paternal Grandmother   . Liver cancer Paternal Aunt 24       ETOH related  . Colon cancer Neg Hx   . Stomach cancer Neg Hx   . Rectal cancer Neg Hx   . Esophageal cancer Neg Hx     Review of Systems: All other systems reviewed and are otherwise negative except as noted above.   Physical Exam: VS:  BP 112/76   Pulse 87   Ht 5\' 6"  (1.676 m)   Wt 210 lb 3.2 oz (95.3 kg)   SpO2 96%   BMI 33.93 kg/m  , BMI Body mass index is 33.93 kg/m. Wt Readings from Last 3 Encounters:  03/05/18 210 lb 3.2 oz (95.3 kg)  05/29/17 219 lb 8 oz (99.6 kg)  02/13/17 214 lb (97.1 kg)    GEN- The patient is well appearing, alert and oriented x 3 today.   HEENT: normocephalic, atraumatic; sclera clear, conjunctiva pink; hearing intact; oropharynx clear; neck supple  Lungs- Clear to ausculation bilaterally, normal work of breathing.  No wheezes, rales, rhonchi Heart- Irregular rate and rhythm  GI- soft, non-tender, non-distended, bowel sounds present  Extremities- no clubbing, cyanosis, or edema  MS- no significant deformity or atrophy Skin- warm and dry, no rash or lesion  Psych- euthymic mood, full affect Neuro- strength and sensation are intact   EKG:  EKG is ordered today. The ekg ordered today shows atrial fibrillation, rate 87  Recent Labs: No results found for requested labs within last 8760 hours.    Other studies Reviewed: Additional studies/ records that were reviewed today include: Dr Odessa Fleming office notes   Assessment and Plan:  1.  Paroxysmal atrial  fibrillation Pt in AF today but was unaware. She has no symptoms of AF and has good exercise tolerance. Will not pursue SR for now as she is asymptomatic. We reviewed symptoms to call for.  Continue Xarelto for CHADS2VASC of 3 CBC, BMET being checked by PCP today Risk factor modification reviewed  2.  HTN Stable No change required today   Current medicines are reviewed at length with the patient today.   The patient does not have concerns regarding her medicines.  The following changes were made today:  none  Labs/ tests ordered today include: none Orders Placed This Encounter  Procedures  . EKG 12-Lead     Disposition:   Follow up with Dr Jessica Mendoza in 1 year      Signed, Gypsy Balsam, NP 03/05/2018 8:33 AM   [facility] [address] [place] [address] [phone] (office) [phone] (fax)

## 2018-03-12 ENCOUNTER — Ambulatory Visit: Payer: Self-pay

## 2018-03-12 DIAGNOSIS — M65971 Unspecified synovitis and tenosynovitis, right ankle and foot: Secondary | ICD-10-CM

## 2018-03-12 DIAGNOSIS — B351 Tinea unguium: Secondary | ICD-10-CM

## 2018-03-12 DIAGNOSIS — M659 Synovitis and tenosynovitis, unspecified: Secondary | ICD-10-CM

## 2018-03-18 NOTE — Progress Notes (Signed)
Pt presents with mycotic infection of nails 1-5 bilateral.  All other systems are negative  Laser therapy administered to affected nails and tolerated well. All safety precautions were in place.  2nd treatment.  Follow up in 4 weeks     

## 2018-04-09 ENCOUNTER — Ambulatory Visit: Payer: Self-pay

## 2018-04-09 DIAGNOSIS — B351 Tinea unguium: Secondary | ICD-10-CM

## 2018-04-09 NOTE — Progress Notes (Signed)
Pt presents with mycotic infection of nails 1-5 bilateral.  All other systems are negative  Laser therapy administered to affected nails and tolerated well. All safety precautions were in place.  3rd treatment.  Follow up in 4 weeks     

## 2018-05-07 ENCOUNTER — Other Ambulatory Visit: Payer: 59

## 2018-05-22 NOTE — Assessment & Plan Note (Signed)
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  Hot flashes have resolved 2. wall occasional arthralgias and myalgias in the hands  Survivorship: Encouraged her to continue to exercise and stay active.  Surveillance for breast cancer:  1. Breast exam 05/28/2017: Benign scar tissue 2. mammograms at [facility]: 10/15/2017: 0.8 cm lobulated mass in the right breast suspicious for malignancy Biopsy: Fibrosis, fat necrosis no malignancy 3.  Bone density 10/10/2016: T score 0.8: Normal  Return to clinic in1 yearfor follow-up

## 2018-05-27 ENCOUNTER — Other Ambulatory Visit: Payer: Self-pay | Admitting: Hematology and Oncology

## 2018-06-03 ENCOUNTER — Telehealth: Payer: Self-pay | Admitting: Hematology and Oncology

## 2018-06-03 NOTE — Telephone Encounter (Signed)
Called regarding upcoming Webex appointment, patient is notified and e-mail will be sent.

## 2018-06-03 NOTE — Progress Notes (Signed)
HEMATOLOGY-ONCOLOGY [name] VISIT PROGRESS NOTE  I connected with Jessica Mendoza on 06/04/2018 at  8:30 AM EDT by Webex video conference and verified that I am speaking with the correct person using two identifiers.  I discussed the limitations, risks, security and privacy concerns of performing an evaluation and management service by Webex and the availability of in person appointments.  I also discussed with the patient that there may be a patient responsible charge related to this service. The patient expressed understanding and agreed to proceed.  Patient's Location: Home Physician Location: Clinic  CHIEF COMPLIANT: Follow-up on anastrozole therapy  INTERVAL HISTORY: Jessica Mendoza is a 63 y.o. female with above-mentioned history of right breast cancer treated with lumpectomy followed by adjuvant radiation and is currently on anastrozole therapy. I last saw her a year ago.  Her most recent mammogram from 10/15/17 showed a 0.8cm mass in the right breast, which when biopsied on 10/15/17 was shown to be benign fat necrosis and fibrosis. She presents today over Webex for annual follow-up.  She reports no pain or discomfort anywhere.  The breast scar occasionally has a little tenderness.  Denies any hot flashes or myalgias.    Breast cancer of upper-outer quadrant of right female breast ([place])   09/08/2014 Mammogram    Right breast: irregular mass with calcifications at 11:00, posterior depth.    09/15/2014 Breast US    Right breast: 1.6 cm irregular mass with indistinct margin at 11:00, posterior depth, 6 CFN. Related microcalcifications. Hypoechoic, correlates with mammographic findings.    09/16/2014 Initial Biopsy    Right breast biopsy: Invasive ductal carcinoma, grade 2, ER+ 100%, PR- 0%, HER-2 negative, Ki-67 10%    09/16/2014 Clinical Stage    Stage IA: T1c N0    09/23/2014 Procedure    Breast/Ovarian panel ([facility]) reveals no clinically signficant variant at ATM, BARD1, BRCA1, BRCA2,  BRIP1, CDH1, CHEK2, EPCAM, FANCC, MLH1, MSH2, MSH6, NBN, PALB2, PMS2, PTEN, RAD51C, RAD51D, TP53, and XRCC2.    11/25/2014 Definitive Surgery    Right lumpectomy: IDC 1.7 cm Grade 2 with DCIS, 0/2 LN, ER+ 100, PR- 0%, HER 2 Neg     11/25/2014 Pathologic Stage    Stage IA: T1c N0    11/25/2014 Oncotype testing    Oncotype DX 18, 11% ROR    12/23/2014 - 01/28/2015 Radiation Therapy    Adjuvant RT: Right breast / 50 Gy in 25 fractions    03/02/2015 -  Anti-estrogen oral therapy    Anastrozole 1 mg daily 5 years    03/31/2015 Survivorship    Survivorship visit completed and copy of care plan given to patient     REVIEW OF SYSTEMS:   Constitutional: Denies fevers, chills or abnormal weight loss Eyes: Denies blurriness of vision Ears, nose, mouth, throat, and face: Denies mucositis or sore throat Respiratory: Denies cough, dyspnea or wheezes Cardiovascular: Denies palpitation, chest discomfort Gastrointestinal:  Denies nausea, heartburn or change in bowel habits Skin: Denies abnormal skin rashes Lymphatics: Denies new lymphadenopathy or easy bruising Neurological:Denies numbness, tingling or new weaknesses Behavioral/Psych: Mood is stable, no new changes  Extremities: No lower extremity edema Breast: denies any pain or lumps or nodules in either breasts All other systems were reviewed with the patient and are negative.  Observations/Objective:  There were no vitals filed for this visit. There is no height or weight on file to calculate BMI.  I have reviewed the data as listed CMP Latest Ref Rng & Units 11/19/2014 09/22/2014 04/07/2013  Glucose 65 - 99 mg/dL 158(H) 116 118(H)  BUN 6 - 20 mg/dL 9 11.9 9  Creatinine 0.44 - 1.00 mg/dL 0.89 1.1 0.79  Sodium 135 - 145 mmol/L 138 140 142  Potassium 3.5 - 5.1 mmol/L 4.1 3.6 4.0  Chloride 101 - 111 mmol/L 100(L) - 102  CO2 22 - 32 mmol/L _0 Calcium 8.9 - 10.3 mg/dL 9.0 9.3 9.2  Total Protein 6.4 - 8.3 g/dL - 7.4 7.2  Total  Bilirubin 0.20 - 1.20 mg/dL - 0.64 0.8  Alkaline Phos 40 - 150 U/L - 87 74  AST 5 - 34 U/L - 37(H) 23  ALT 0 - 55 U/L - 61(H) 38(H)    Lab Results  Component Value Date   WBC 4.5 02/13/2017   HGB 12.0 02/13/2017   HCT 35.9 02/13/2017   MCV 83 02/13/2017   PLT 230 02/13/2017   NEUTROABS 2.2 02/13/2017      Assessment Plan:  Breast cancer of upper-outer quadrant of right female breast ([place]) Rt Lumpectomy 11/25/14: IDC 1.7 cm Grade 2 with DCIS , 0/2 LN, ER 100, PR 0%, Her 2 Neg T1CN0 (Stage 1A) Oncotype DX 18, 11% risk of recurrence Adjuvant radiation therapy 12/23/2014 to 01/28/2015  Current treatment: Adjuvant antiestrogen therapy with anastrozole 1 mg daily 5 years started 03/02/2015  Anastrozole toxicities: Denies any hot flashes or arthralgias or myalgias.  Survivorship: Encouraged her to continue to exercise and stay active.  Surveillance for breast cancer:  1. Breast exam 05/28/2017: Benign scar tissue 2. mammograms at [facility]: 10/15/2017: 0.8 cm lobulated mass in the right breast suspicious for malignancy Biopsy: Fibrosis, fat necrosis no malignancy 3.  Bone density 10/10/2016: T score 0.8: Normal I requested another bone density to be done along with her mammogram this year.  Return to clinic in1 yearfor follow-up    I discussed the assessment and treatment plan with the patient. The patient was provided an opportunity to ask questions and all were answered. The patient agreed with the plan and demonstrated an understanding of the instructions. The patient was advised to call back or seek an in-person evaluation if the symptoms worsen or if the condition fails to improve as anticipated.   I provided 15 minutes of face-to-face Web Ex time during this encounter.    Rulon Eisenmenger, MD 06/04/2018   I, Molly Dorshimer, am acting as scribe for Nicholas Lose, MD.  I have reviewed the above documentation for accuracy and completeness, and I agree with the above.

## 2018-06-04 ENCOUNTER — Inpatient Hospital Stay: Payer: 59 | Attending: Hematology and Oncology | Admitting: Hematology and Oncology

## 2018-06-04 DIAGNOSIS — Z170 Estrogen receptor positive status [ER+]: Secondary | ICD-10-CM

## 2018-06-04 DIAGNOSIS — Z780 Asymptomatic menopausal state: Secondary | ICD-10-CM

## 2018-06-04 DIAGNOSIS — Z79811 Long term (current) use of aromatase inhibitors: Secondary | ICD-10-CM | POA: Diagnosis not present

## 2018-06-04 DIAGNOSIS — C50411 Malignant neoplasm of upper-outer quadrant of right female breast: Secondary | ICD-10-CM | POA: Diagnosis not present

## 2018-06-04 MED ORDER — ANASTROZOLE 1 MG PO TABS: 1.0000 mg | ORAL_TABLET | Freq: Every day | ORAL | 3 refills | Status: DC

## 2018-06-18 ENCOUNTER — Ambulatory Visit (INDEPENDENT_AMBULATORY_CARE_PROVIDER_SITE_OTHER): Payer: Self-pay

## 2018-06-18 ENCOUNTER — Other Ambulatory Visit: Payer: Self-pay

## 2018-06-18 DIAGNOSIS — B351 Tinea unguium: Secondary | ICD-10-CM

## 2018-06-18 NOTE — Progress Notes (Signed)
Pt presents with mycotic infection of nails 1-5 bilateral  All other systems are negative  Laser therapy administered to affected nails and tolerated well. All safety precautions were in place. 4th treatment.  Follow up in 4 weeks     

## 2018-07-23 ENCOUNTER — Other Ambulatory Visit: Payer: Self-pay

## 2018-07-23 ENCOUNTER — Ambulatory Visit (INDEPENDENT_AMBULATORY_CARE_PROVIDER_SITE_OTHER): Payer: 59 | Admitting: Podiatry

## 2018-07-23 DIAGNOSIS — B351 Tinea unguium: Secondary | ICD-10-CM

## 2018-07-28 NOTE — Progress Notes (Signed)
Pt presents with mycotic infection of nails 1-5 bilateral  All other systems are negative  Laser therapy administered to affected nails and tolerated well. All safety precautions were in place.  5th treatment.  Follow up in 4 weeks

## 2018-08-20 ENCOUNTER — Ambulatory Visit (INDEPENDENT_AMBULATORY_CARE_PROVIDER_SITE_OTHER): Payer: Self-pay | Admitting: Podiatry

## 2018-08-20 ENCOUNTER — Other Ambulatory Visit: Payer: Self-pay

## 2018-08-20 DIAGNOSIS — B351 Tinea unguium: Secondary | ICD-10-CM

## 2018-09-19 NOTE — Progress Notes (Signed)
Pt presents with mycotic infection of nails 1-5 bilateral  All other systems are negative  Laser therapy administered to affected nails and tolerated well. All safety precautions were in place.  6th treatment.  Follow up in 4 weeks

## 2018-09-24 ENCOUNTER — Ambulatory Visit: Payer: Self-pay | Admitting: Podiatry

## 2018-09-24 ENCOUNTER — Other Ambulatory Visit: Payer: Self-pay

## 2018-09-24 DIAGNOSIS — B351 Tinea unguium: Secondary | ICD-10-CM

## 2018-10-15 ENCOUNTER — Encounter: Payer: Self-pay | Admitting: Hematology and Oncology

## 2018-10-21 NOTE — Progress Notes (Signed)
Pt presents with mycotic infection of nails 1-5 bilateral  All other systems are negative  Laser therapy administered to affected nails and tolerated well. All safety precautions were in place.  Follow up prn    

## 2018-10-22 ENCOUNTER — Other Ambulatory Visit: Payer: Self-pay

## 2018-10-22 ENCOUNTER — Ambulatory Visit: Payer: Self-pay | Admitting: Podiatry

## 2018-10-22 DIAGNOSIS — B351 Tinea unguium: Secondary | ICD-10-CM

## 2018-11-13 NOTE — Progress Notes (Signed)
Pt presents with mycotic infection of nails 1-5 bilateral All other systems are negative  Laser therapy administered to affected nails and tolerated well. All safety precautions were in place. 5th treatment.  Follow up in 4 weeks     

## 2018-11-26 ENCOUNTER — Other Ambulatory Visit: Payer: Self-pay

## 2018-11-26 ENCOUNTER — Ambulatory Visit: Payer: Self-pay

## 2018-11-26 DIAGNOSIS — B351 Tinea unguium: Secondary | ICD-10-CM

## 2018-11-26 NOTE — Progress Notes (Signed)
Pt presents with mycotic infection of nails 1-5 bilateral  All other systems are negative  Laser therapy administered to affected nails and tolerated well. All safety precautions were in place.  Follow up in 4 weeks to see Dr Amalia Hailey to evaluate progress if needed

## 2018-12-24 ENCOUNTER — Ambulatory Visit: Payer: 59 | Admitting: Podiatry

## 2019-03-31 ENCOUNTER — Other Ambulatory Visit: Payer: Self-pay | Admitting: Nurse Practitioner

## 2019-03-31 DIAGNOSIS — Z8249 Family history of ischemic heart disease and other diseases of the circulatory system: Secondary | ICD-10-CM

## 2019-03-31 DIAGNOSIS — I480 Paroxysmal atrial fibrillation: Secondary | ICD-10-CM

## 2019-03-31 NOTE — Telephone Encounter (Signed)
Prescription refill request for Xarelto received.   Last office visit: 03/05/2018, Jessica Mendoza Weight: 95.3 kg Age: 64 y.o. Scr: 0.96, 09/03/2018 CrCl: 89 ml/min    Overdue for an office visit, pt has an appointment with Dr. Caryl Comes 04/20/2019. Prescription refill sent.

## 2019-04-16 DIAGNOSIS — I422 Other hypertrophic cardiomyopathy: Secondary | ICD-10-CM | POA: Insufficient documentation

## 2019-04-20 ENCOUNTER — Ambulatory Visit: Payer: 59 | Admitting: Internal Medicine

## 2019-04-20 ENCOUNTER — Other Ambulatory Visit: Payer: Self-pay

## 2019-04-20 ENCOUNTER — Encounter: Payer: Self-pay | Admitting: Internal Medicine

## 2019-04-20 VITALS — BP 110/80 | HR 68 | Ht 65.0 in | Wt 209.0 lb

## 2019-04-20 DIAGNOSIS — I422 Other hypertrophic cardiomyopathy: Secondary | ICD-10-CM | POA: Diagnosis not present

## 2019-04-20 DIAGNOSIS — I4891 Unspecified atrial fibrillation: Secondary | ICD-10-CM | POA: Diagnosis not present

## 2019-04-20 NOTE — Progress Notes (Signed)
She is in skf      Patient Care Team: Gaynelle Arabian, MD as PCP - General (Family Medicine) Jacelyn Pi, MD (Endocrinology) Inda Castle, MD (Inactive) (Gastroenterology) Vania Rea, MD (Obstetrics and Gynecology) Excell Seltzer, MD (Inactive) as Consulting Physician (General Surgery) Nicholas Lose, MD as Consulting Physician (Hematology and Oncology) Eppie Gibson, MD as Attending Physician (Radiation Oncology) Mauro Kaufmann, RN as Registered Nurse Rockwell Germany, RN as Registered Nurse Holley Bouche, NP (Inactive) as Nurse Practitioner (Nurse Practitioner) Sylvan Cheese, NP as Nurse Practitioner (Nurse Practitioner)   HPI  Jessica Mendoza is a 64 y.o. female Seen in followup for atrial fibrillation associated with exercise intolerance. She had a CHADS2 score of 2 and a CHADS-VASc score of 3 and is on Rivaroxaban;    She underwent cardioversion 5/14   There is a family history of HCM although her echocardiogram 2014 doesn't demonstrate any evidence of LVH. The patient denies chest pain, shortness of breath, nocturnal dyspnea, orthopnea or peripheral edema.  There have been no palpitations, lightheadedness or syncope.    Awaiting vaccine  On Anticoagulation;  No bleeding issues    DATE TEST EF   6/14 MYOVIEW    % No ischemia  5/14 Echo   55-65 % LAE mild          Date Cr Hgb  10/16 0.89 12.6   1/19  12     Thromboembolic risk factors (, HTN-1, DM-1, Gender-1) for a CHADSVASc Score of 3   Past Medical History:  Diagnosis Date  . Afib ([place])   . Arthritis    WRISTS AND NECK  . Benign positional vertigo   . Bloating   . Breast cancer ([place]) 2016   ER+/PR-/Her2-  . Colon polyps 12/2005  . Diabetes mellitus, type 2 ([place]) 06/2010  . Essential hypertension, benign   . Family history of hypertrophic cardiomyopathy   . Family history of ovarian cancer   . Family history of pancreatic cancer   . Focal nodular hyperplasia of  liver    bx 2002  . Glaucoma   . Goiter 10/2006  . Hot flashes   . Hypothyroidism   . Migraines    menstrual  . Obesity   . OSA (obstructive sleep apnea)   . Other and unspecified hyperlipidemia   . Sleep apnea     Past Surgical History:  Procedure Laterality Date  . BREAST LUMPECTOMY     right breast- benign  . BREAST LUMPECTOMY WITH RADIOACTIVE SEED AND SENTINEL LYMPH NODE BIOPSY Right 11/25/2014   Procedure: RIGHT BREAST LUMPECTOMY WITH RADIOACTIVE SEED AND RIGHT AXILLARY SENTINEL LYMPH NODE BIOPSY;  Surgeon: Excell Seltzer, MD;  Location: [facility];  Service: General;  Laterality: Right;  . CARDIOVERSION N/A 07/11/2012   Procedure: CARDIOVERSION;  Surgeon: Larey Dresser, MD;  Location: [facility];  Service: Cardiovascular;  Laterality: N/A;  . LIVER BIOPSY  2002    Current Outpatient Medications  Medication Sig Dispense Refill  . amLODipine (NORVASC) 5 MG tablet TAKE 1 TABLET BY MOUTH  DAILY 90 tablet 3  . anastrozole (ARIMIDEX) 1 MG tablet Take 1 tablet (1 mg total) by mouth daily. 90 tablet 3  . Cholecalciferol (VITAMIN D-3) 1000 UNITS CAPS Take 1 capsule by mouth 3 (three) times a week.    . Coenzyme Q10 (CO Q 10) 100 MG CAPS Take by mouth daily.     . Collagen Hydrolysate POWD 1 scoop by Does not apply route daily.  0  . Cyanocobalamin (B-12) 1000 MCG SUBL Place 1 mL under the tongue daily. 30 each   . estradiol (ESTRACE VAGINAL) 0.1 MG/GM vaginal cream Place 1 Applicatorful vaginally at bedtime. 42.5 g 12  . levothyroxine (SYNTHROID, LEVOTHROID) 25 MCG tablet Take 25 mcg by mouth daily. Dr Suzette Battiest sees pt for thyroid    . metFORMIN (GLUCOPHAGE-XR) 500 MG 24 hr tablet Take 1 tablet (500 mg total) by mouth daily with breakfast. NO MORE REFILLS WITHOUT OFFICE VISIT - 2ND NOTICE (Patient taking differently: Take 500 mg by mouth daily with breakfast. Dr. Marisue Humble fills this for pt) 15 tablet 0  . metoprolol succinate (TOPROL-XL) 100 MG 24 hr tablet Take 1  tablet (100 mg total) by mouth daily. NO MORE REFILLS WITHOUT OFFICE VISIT - 2ND NOTICE (Patient taking differently: Take 100 mg by mouth daily. Dr. Marisue Humble fills for pt) 15 tablet 0  . NON FORMULARY 5,000 mg daily. Biotin    . NON FORMULARY 50 mg daily. Zinc    . ONETOUCH VERIO test strip CHECK BS BID  7  . OXYBUTYNIN CHLORIDE PO Take 10 mg by mouth daily.    . potassium chloride (K-DUR) 10 MEQ tablet Take 10 mEq by mouth daily.    . potassium chloride (K-DUR,KLOR-CON) 10 MEQ tablet potassium chloride ER 10 mEq tablet,extended release(part/cryst)    . XARELTO 20 MG TABS tablet TAKE 1 TABLET (20 MG TOTAL) BY MOUTH DAILY WITH SUPPER. 90 tablet 0  . Zinc Acetate, Oral, (ZINC ACETATE PO) Take by mouth.     Current Facility-Administered Medications  Medication Dose Route Frequency Provider Last Rate Last Admin  . 0.9 %  sodium chloride infusion  500 mL Intravenous Continuous Gatha Mayer, MD        Allergies  Allergen Reactions  . Simvastatin     Myalgias  ?? lipitor   . Statins Other (See Comments)    Review of Systems negative except from HPI and PMH  Physical Exam BP 110/80   Pulse 68   Ht _0  (1.651 m)   Wt 209 lb (94.8 kg)   SpO2 98%   BMI 34.78 kg/m  Well developed and nourished in no acute distress HENT normal Neck supple with JVP-  Flat  Clear Irregularly irregular rate and rhythm, no murmurs or gallops Abd-soft with active BS No Clubbing cyanosis edema Skin-warm and dry A & Oriented  Grossly normal sensory and motor function  ECG Atrial fib  @ 67 -/07/40 ECG 1/20 atrial fibrillation ECG 1/19 sinus rhythm Assessment and  Plan  Hypertension     Atrial fibrillation- persistent   Obesity   Family history of HCM  Patient has persistent atrial fibrillation.  My take on it is a little bit different from AF-NP a year ago.  My inclination would be, in the event that we are able to maintain sinus rhythm without the need of antiarrhythmic drugs that given her  young age would pursue a strategy of rhythm control i.e. cardioversion.  She is agreeable.  In anticipation of that we will look at her left atrial size.  This also give [place] a chance to review as it her structural issues related to the possibility of HCM given her family history.  Lengthy discussion regarding the Covid vaccine in her with her multiple risk factors

## 2019-04-20 NOTE — Patient Instructions (Signed)
Medication Instructions:  °Your physician recommends that you continue on your current medications as directed. Please refer to the Current Medication list given to you today. ° °*If you need a refill on your cardiac medications before your next appointment, please call your pharmacy* ° ° °Lab Work: °None ordered. ° °If you have labs (blood work) drawn today and your tests are completely normal, you will receive your results only by: °MyChart Message (if you have MyChart) OR °A paper copy in the mail °If you have any lab test that is abnormal or we need to change your treatment, we will call you to review the results. ° ° °Testing/Procedures: °Your physician has requested that you have an echocardiogram. Echocardiography is a painless test that uses sound waves to create images of your heart. It provides your doctor with information about the size and shape of your heart and how well your heart’s chambers and valves are working. This procedure takes approximately one hour. There are no restrictions for this procedure. ° ° ° °Follow-Up: °At [facility], you and your health needs are our priority.  As part of our continuing mission to provide you with exceptional heart care, we have created designated Provider Care Teams.  These Care Teams include your primary Cardiologist (physician) and Advanced Practice Providers (APPs -  Physician Assistants and Nurse Practitioners) who all work together to provide you with the care you need, when you need it. ° °We recommend signing up for the patient portal called "MyChart".  Sign up information is provided on this After Visit Summary.  MyChart is used to connect with patients for Virtual Visits (Telemedicine).  Patients are able to view lab/test results, encounter notes, upcoming appointments, etc.  Non-urgent messages can be sent to your provider as well.   °To learn more about what you can do with MyChart, go to [email or web address].   ° °Your next appointment:   °12  months with Dr Klein °

## 2019-04-20 NOTE — Addendum Note (Signed)
Addended by: Thora Lance on: 04/20/2019 02:04 PM   Modules accepted: Orders

## 2019-05-13 ENCOUNTER — Ambulatory Visit (HOSPITAL_COMMUNITY): Payer: 59 | Attending: Cardiovascular Disease

## 2019-05-13 ENCOUNTER — Other Ambulatory Visit: Payer: Self-pay

## 2019-05-13 ENCOUNTER — Other Ambulatory Visit: Payer: 59 | Admitting: *Deleted

## 2019-05-13 DIAGNOSIS — I422 Other hypertrophic cardiomyopathy: Secondary | ICD-10-CM

## 2019-05-13 DIAGNOSIS — I4891 Unspecified atrial fibrillation: Secondary | ICD-10-CM | POA: Diagnosis present

## 2019-05-13 LAB — BASIC METABOLIC PANEL
BUN/Creatinine Ratio: 14 (ref 12–28)
BUN: 13 mg/dL (ref 8–27)
CO2: 24 mmol/L (ref 20–29)
Calcium: 9.1 mg/dL (ref 8.7–10.3)
Chloride: 102 mmol/L (ref 96–106)
Creatinine, Ser: 0.95 mg/dL (ref 0.57–1.00)
GFR calc Af Amer: 73 mL/min/{1.73_m2} (ref >59)
GFR calc non Af Amer: 63 mL/min/{1.73_m2} (ref >59)
Glucose: 91 mg/dL (ref 65–99)
Potassium: 3.9 mmol/L (ref 3.5–5.2)
Sodium: 140 mmol/L (ref 134–144)

## 2019-05-13 LAB — CBC
Hematocrit: 38 % (ref 34.0–46.6)
Hemoglobin: 12.5 g/dL (ref 11.1–15.9)
MCH: 27.9 pg (ref 26.6–33.0)
MCHC: 32.9 g/dL (ref 31.5–35.7)
MCV: 85 fL (ref 79–97)
Platelets: 215 10*3/uL (ref 150–450)
RBC: 4.48 x10E6/uL (ref 3.77–5.28)
RDW: 14.4 % (ref 11.7–15.4)
WBC: 5 10*3/uL (ref 3.4–10.8)

## 2019-05-27 ENCOUNTER — Telehealth: Payer: Self-pay

## 2019-05-27 NOTE — Telephone Encounter (Signed)
Call from patient to review labs.    Pt verbalized understanding and has no further questions at this time.    Advised pt to call for any further questions or concerns.  No further orders.

## 2019-06-11 NOTE — Telephone Encounter (Signed)
Attempted phone call to pt and left message for pt to contact RN at [phone].

## 2019-06-11 NOTE — Telephone Encounter (Signed)
Pt returning phone call.  Advised pt will need to schedule appointment with Dr Olin Pia PA prior to cardioversion.  Appointment scheduled with Tommye Standard, PA for 06/24/2019 at 1pm to update H&P,EKG and labs.  Pt states she is feeling well with no symptoms.  Pt verbalizes understanding and agrees with current plan.

## 2019-06-19 ENCOUNTER — Other Ambulatory Visit: Payer: Self-pay | Admitting: Nurse Practitioner

## 2019-06-19 DIAGNOSIS — I480 Paroxysmal atrial fibrillation: Secondary | ICD-10-CM

## 2019-06-19 DIAGNOSIS — Z8249 Family history of ischemic heart disease and other diseases of the circulatory system: Secondary | ICD-10-CM

## 2019-06-19 NOTE — Telephone Encounter (Signed)
Last OV 04/20/19.  Scr 0.95 on 05/13/19.  CrCl 89.54 ml/min

## 2019-06-23 NOTE — Progress Notes (Signed)
Patient Care Team: Gaynelle Arabian, MD as PCP - General (Family Medicine) Jacelyn Pi, MD (Endocrinology) Inda Castle, MD (Inactive) (Gastroenterology) Vania Rea, MD (Obstetrics and Gynecology) Excell Seltzer, MD (Inactive) as Consulting Physician (General Surgery) Nicholas Lose, MD as Consulting Physician (Hematology and Oncology) Eppie Gibson, MD as Attending Physician (Radiation Oncology) Mauro Kaufmann, RN as Registered Nurse Rockwell Germany, RN as Registered Nurse Holley Bouche, NP (Inactive) as Nurse Practitioner (Nurse Practitioner) Sylvan Cheese, NP as Nurse Practitioner (Nurse Practitioner)  DIAGNOSIS:    ICD-10-CM   1. Malignant neoplasm of upper-outer quadrant of right breast in female, estrogen receptor positive ([place])  C50.411    Z17.0     SUMMARY OF ONCOLOGIC HISTORY: Oncology History  Breast cancer of upper-outer quadrant of right female breast ([place])  09/08/2014 Mammogram   Right breast: irregular mass with calcifications at 11:00, posterior depth.   09/15/2014 Breast US   Right breast: 1.6 cm irregular mass with indistinct margin at 11:00, posterior depth, 6 CFN. Related microcalcifications. Hypoechoic, correlates with mammographic findings.   09/16/2014 Initial Biopsy   Right breast biopsy: Invasive ductal carcinoma, grade 2, ER+ 100%, PR- 0%, HER-2 negative, Ki-67 10%   09/16/2014 Clinical Stage   Stage IA: T1c N0   09/23/2014 Procedure   Breast/Ovarian panel ([facility]) reveals no clinically signficant variant at ATM, BARD1, BRCA1, BRCA2, BRIP1, CDH1, CHEK2, EPCAM, FANCC, MLH1, MSH2, MSH6, NBN, PALB2, PMS2, PTEN, RAD51C, RAD51D, TP53, and XRCC2.   11/25/2014 Definitive Surgery   Right lumpectomy: IDC 1.7 cm Grade 2 with DCIS, 0/2 LN, ER+ 100, PR- 0%, HER 2 Neg    11/25/2014 Pathologic Stage   Stage IA: T1c N0   11/25/2014 Oncotype testing   Oncotype DX 18, 11% ROR   12/23/2014 - 01/28/2015 Radiation Therapy   Adjuvant RT:  Right breast / 50 Gy in 25 fractions   03/02/2015 -  Anti-estrogen oral therapy   Anastrozole 1 mg daily 5 years   03/31/2015 Survivorship   Survivorship visit completed and copy of care plan given to patient     CHIEF COMPLIANT: Follow-up of right breast cancer on anastrozole therapy  INTERVAL HISTORY: Jessica Mendoza is a 64 y.o. with above-mentioned history of right breast cancer treated with lumpectomy, radiation, and who is currently on antiestrogen therapy with anastrozole. Mammogram on 10/15/18 showed no evidence of malignancy bilaterally. Bone density scan on 10/15/18 showed normal bone density with a T-score of 0.2 at the right femur neck. She presents to the clinic today for annual follow-up.   ALLERGIES:  is allergic to simvastatin and statins.  MEDICATIONS:  Current Outpatient Medications  Medication Sig Dispense Refill  . amLODipine (NORVASC) 5 MG tablet TAKE 1 TABLET BY MOUTH  DAILY 90 tablet 3  . anastrozole (ARIMIDEX) 1 MG tablet Take 1 tablet (1 mg total) by mouth daily. 90 tablet 3  . Cholecalciferol (VITAMIN D-3) 1000 UNITS CAPS Take 1 capsule by mouth 3 (three) times a week.    . Coenzyme Q10 (CO Q 10) 100 MG CAPS Take by mouth daily.     . Collagen Hydrolysate POWD 1 scoop by Does not apply route daily.  0  . Cyanocobalamin (B-12) 1000 MCG SUBL Place 1 mL under the tongue daily. 30 each   . estradiol (ESTRACE VAGINAL) 0.1 MG/GM vaginal cream Place 1 Applicatorful vaginally at bedtime. 42.5 g 12  . levothyroxine (SYNTHROID, LEVOTHROID) 25 MCG tablet Take 25 mcg by mouth daily. Dr Suzette Battiest sees pt for thyroid    .  metFORMIN (GLUCOPHAGE-XR) 500 MG 24 hr tablet Take 1 tablet (500 mg total) by mouth daily with breakfast. NO MORE REFILLS WITHOUT OFFICE VISIT - 2ND NOTICE (Patient taking differently: Take 500 mg by mouth daily with breakfast. Dr. Marisue Humble fills this for pt) 15 tablet 0  . metoprolol succinate (TOPROL-XL) 100 MG 24 hr tablet Take 1 tablet (100 mg total) by mouth  daily. NO MORE REFILLS WITHOUT OFFICE VISIT - 2ND NOTICE (Patient taking differently: Take 100 mg by mouth daily. Dr. Marisue Humble fills for pt) 15 tablet 0  . NON FORMULARY 5,000 mg daily. Biotin    . NON FORMULARY 50 mg daily. Zinc    . ONETOUCH VERIO test strip CHECK BS BID  7  . OXYBUTYNIN CHLORIDE PO Take 10 mg by mouth daily.    . potassium chloride (K-DUR) 10 MEQ tablet Take 10 mEq by mouth daily.    . potassium chloride (K-DUR,KLOR-CON) 10 MEQ tablet potassium chloride ER 10 mEq tablet,extended release(part/cryst)    . XARELTO 20 MG TABS tablet TAKE 1 TABLET (20 MG TOTAL) BY MOUTH DAILY WITH SUPPER. 30 tablet 5  . Zinc Acetate, Oral, (ZINC ACETATE PO) Take by mouth.     Current Facility-Administered Medications  Medication Dose Route Frequency Provider Last Rate Last Admin  . 0.9 %  sodium chloride infusion  500 mL Intravenous Continuous Gatha Mayer, MD        PHYSICAL EXAMINATION: ECOG PERFORMANCE STATUS: 1 - Symptomatic but completely ambulatory  There were no vitals filed for this visit. There were no vitals filed for this visit.  BREAST: No palpable masses or nodules in either right or left breasts. No palpable axillary supraclavicular or infraclavicular adenopathy no breast tenderness or nipple discharge. (exam performed in the presence of a chaperone)  LABORATORY DATA:  I have reviewed the data as listed CMP Latest Ref Rng & Units 05/13/2019 11/19/2014 09/22/2014  Glucose 65 - 99 mg/dL 91 158(H) 116  BUN 8 - 27 mg/dL 13 9 11.9  Creatinine 0.57 - 1.00 mg/dL 0.95 0.89 1.1  Sodium 134 - 144 mmol/L 140 138 140  Potassium 3.5 - 5.2 mmol/L 3.9 4.1 3.6  Chloride 96 - 106 mmol/L 102 100(L) -  CO2 20 - 29 mmol/L _0 Calcium 8.7 - 10.3 mg/dL 9.1 9.0 9.3  Total Protein 6.4 - 8.3 g/dL - - 7.4  Total Bilirubin 0.20 - 1.20 mg/dL - - 0.64  Alkaline Phos 40 - 150 U/L - - 87  AST 5 - 34 U/L - - 37(H)  ALT 0 - 55 U/L - - 61(H)    Lab Results  Component Value Date   WBC 5.0  05/13/2019   HGB 12.5 05/13/2019   HCT 38.0 05/13/2019   MCV 85 05/13/2019   PLT 215 05/13/2019   NEUTROABS 2.2 02/13/2017    ASSESSMENT & PLAN:  Breast cancer of upper-outer quadrant of right female breast ([place]) Rt Lumpectomy 11/25/14: IDC 1.7 cm Grade 2 with DCIS , 0/2 LN, ER 100, PR 0%, Her 2 Neg T1CN0 (Stage 1A) Oncotype DX 18, 11% risk of recurrence Adjuvant radiation therapy 12/23/2014 to 01/28/2015  Current treatment: Adjuvant antiestrogen therapy with anastrozole 1 mg daily 5 years started 03/02/2015  Anastrozole toxicities: Denies any hot flashes or arthralgias or myalgias.  Survivorship: Encouraged her to continue to exercise and stay active.  Surveillance for breast cancer:  1. Breast exam5/01/2020: Benign scar tissue 2. mammograms [facility]:  10/29/2018: Benign breast density category B 3.Bone density  10/29/2018: T score 0.2: Normal   Return to clinic in1 yearfor follow-up     No orders of the defined types were placed in this encounter.  The patient has a good understanding of the overall plan. she agrees with it. she will call with any problems that may develop before the next visit here.  Total time spent: 20 mins including face to face time and time spent for planning, charting and coordination of care  Nicholas Lose, MD 06/24/2019  I, Cloyde Reams Dorshimer, am acting as scribe for Dr. Nicholas Lose.  I have reviewed the above documentation for accuracy and completeness, and I agree with the above.

## 2019-06-24 ENCOUNTER — Other Ambulatory Visit: Payer: Self-pay | Admitting: Hematology and Oncology

## 2019-06-24 ENCOUNTER — Encounter: Payer: Self-pay | Admitting: *Deleted

## 2019-06-24 ENCOUNTER — Ambulatory Visit: Payer: 59 | Admitting: Physician Assistant

## 2019-06-24 ENCOUNTER — Inpatient Hospital Stay: Payer: 59 | Attending: Hematology and Oncology | Admitting: Hematology and Oncology

## 2019-06-24 ENCOUNTER — Other Ambulatory Visit: Payer: Self-pay

## 2019-06-24 VITALS — BP 128/78 | HR 68 | Ht 65.0 in | Wt 213.0 lb

## 2019-06-24 DIAGNOSIS — Z79899 Other long term (current) drug therapy: Secondary | ICD-10-CM | POA: Diagnosis not present

## 2019-06-24 DIAGNOSIS — I4819 Other persistent atrial fibrillation: Secondary | ICD-10-CM | POA: Diagnosis not present

## 2019-06-24 DIAGNOSIS — Z888 Allergy status to other drugs, medicaments and biological substances status: Secondary | ICD-10-CM | POA: Insufficient documentation

## 2019-06-24 DIAGNOSIS — Z79811 Long term (current) use of aromatase inhibitors: Secondary | ICD-10-CM | POA: Diagnosis not present

## 2019-06-24 DIAGNOSIS — Z7901 Long term (current) use of anticoagulants: Secondary | ICD-10-CM | POA: Insufficient documentation

## 2019-06-24 DIAGNOSIS — Z170 Estrogen receptor positive status [ER+]: Secondary | ICD-10-CM | POA: Diagnosis not present

## 2019-06-24 DIAGNOSIS — C50411 Malignant neoplasm of upper-outer quadrant of right female breast: Secondary | ICD-10-CM | POA: Insufficient documentation

## 2019-06-24 DIAGNOSIS — I10 Essential (primary) hypertension: Secondary | ICD-10-CM

## 2019-06-24 MED ORDER — METOPROLOL TARTRATE 50 MG PO TABS: 50.0000 mg | ORAL_TABLET | Freq: Two times a day (BID) | ORAL | Status: DC

## 2019-06-24 MED ORDER — ANASTROZOLE 1 MG PO TABS: 1.0000 mg | ORAL_TABLET | Freq: Every day | ORAL | 3 refills | Status: DC

## 2019-06-24 NOTE — Assessment & Plan Note (Signed)
Rt Lumpectomy 11/25/14: IDC 1.7 cm Grade 2 with DCIS , 0/2 LN, ER 100, PR 0%, Her 2 Neg T1CN0 (Stage 1A) Oncotype DX 18, 11% risk of recurrence Adjuvant radiation therapy 12/23/2014 to 01/28/2015  Current treatment: Adjuvant antiestrogen therapy with anastrozole 1 mg daily 5 years started 03/02/2015  Anastrozole toxicities: Denies any hot flashes or arthralgias or myalgias.  Survivorship: Encouraged her to continue to exercise and stay active.  Surveillance for breast cancer:  1. Breast exam5/01/2020: Benign scar tissue 2. mammograms [facility]:  10/29/2018: Benign breast density category B 3.Bone density 10/29/2018: T score 0.2: Normal   Return to clinic in1 yearfor follow-up

## 2019-06-24 NOTE — Progress Notes (Signed)
Cardiology Office Note Date:  06/24/2019  Patient ID:  Jessica, Mendoza [birth year], MRN [id number] PCP:  Blair Heys, MD  Electrophysiologist:  Dr. Graciela Husbands     Chief Complaint:  needs DCCV  History of Present Illness: Jessica Mendoza is a 64 y.o. female with history of breast cancer (lumpectomy, radioactive seed placement, chemo), HTN, DM, hypothyroidism, OSA (she reports after losing about 30lbs, retested and negative), HLD, and AFib. Family hx of HCM (her echos have been w/o LVH)   She saw A. Seiler Jan 2020, at that visit she was in rate controlled AF and unaware.  Feeling well, exercising regularly.  No plans for rhythm control given no symptoms  She last saw Dr. Graciela Husbands March 2021, at that time described as symptomatic with exercise intolerance (though this comment is mentioned on all notes), planned to pursue rhythm control mentioning young age as part of decision as well, planned to pursue updated and DCCV.  She continues to do well.  No notable symptoms with her AFib.   No CP cardiac awareness, palpitations.  She no near syncope or syncope.  She continues to exercise regularly via [employer] and [place] program,  Ends up to be 5 days a week.  At peak exercise she is winded, but no exertional incapacities.  She is very compliant with her her medicines, no missed doses of her Xarelto in the last 3 weeks if ever.  No bleeding or signs of bleeding  AFib hx 2014  AAD Hx None to date   Past Medical History:  Diagnosis Date  . Afib (HCC)   . Arthritis    WRISTS AND NECK  . Benign positional vertigo   . Bloating   . Breast cancer (HCC) 2016   ER+/PR-/Her2-  . Colon polyps 12/2005  . Diabetes mellitus, type 2 (HCC) 06/2010  . Essential hypertension, benign   . Family history of hypertrophic cardiomyopathy   . Family history of ovarian cancer   . Family history of pancreatic cancer   . Focal nodular hyperplasia of liver    bx 2002  . Glaucoma   . Goiter 10/2006    . Hot flashes   . Hypothyroidism   . Migraines    menstrual  . Obesity   . OSA (obstructive sleep apnea)   . Other and unspecified hyperlipidemia   . Sleep apnea     Past Surgical History:  Procedure Laterality Date  . BREAST LUMPECTOMY     right breast- benign  . BREAST LUMPECTOMY WITH RADIOACTIVE SEED AND SENTINEL LYMPH NODE BIOPSY Right 11/25/2014   Procedure: RIGHT BREAST LUMPECTOMY WITH RADIOACTIVE SEED AND RIGHT AXILLARY SENTINEL LYMPH NODE BIOPSY;  Surgeon: Glenna Fellows, MD;  Location:  [facility]ERY [facility];  Service: General;  Laterality: Right;  . CARDIOVERSION N/A 07/11/2012   Procedure: CARDIOVERSION;  Surgeon: Laurey Morale, MD;  Location: [facility] ENDOSCOPY;  Service: Cardiovascular;  Laterality: N/A;  . LIVER BIOPSY  2002    Current Outpatient Medications  Medication Sig Dispense Refill  . amLODipine (NORVASC) 5 MG tablet TAKE 1 TABLET BY MOUTH  DAILY 90 tablet 3  . anastrozole (ARIMIDEX) 1 MG tablet Take 1 tablet (1 mg total) by mouth daily. 90 tablet 3  . Cholecalciferol (VITAMIN D-3) 1000 UNITS CAPS Take 1 capsule by mouth 3 (three) times a week.    . Coenzyme Q10 (CO Q 10) 100 MG CAPS Take by mouth daily.     . Collagen Hydrolysate POWD 1 scoop by Does not  apply route daily.  0  . estradiol (ESTRACE VAGINAL) 0.1 MG/GM vaginal cream Place 1 Applicatorful vaginally at bedtime. 42.5 g 12  . levothyroxine (SYNTHROID, LEVOTHROID) 25 MCG tablet Take 25 mcg by mouth daily. Dr Horald Pollen sees pt for thyroid    . metFORMIN (GLUCOPHAGE-XR) 500 MG 24 hr tablet Take 1 tablet (500 mg total) by mouth daily with breakfast. NO MORE REFILLS WITHOUT OFFICE VISIT - 2ND NOTICE (Patient taking differently: Take 500 mg by mouth daily with breakfast. Dr. Manus Gunning fills this for pt) 15 tablet 0  . metoprolol tartrate (LOPRESSOR) 50 MG tablet Take 1 tablet (50 mg total) by mouth 2 (two) times daily.    . NON FORMULARY 5,000 mg daily. Biotin    . NON FORMULARY 50 mg daily. Zinc    .  ONETOUCH VERIO test strip CHECK BS BID  7  . OXYBUTYNIN CHLORIDE PO Take 10 mg by mouth daily.    . potassium chloride SA (KLOR-CON) 20 MEQ tablet Take 20 mEq by mouth daily.    Carlena Hurl 20 MG TABS tablet TAKE 1 TABLET (20 MG TOTAL) BY MOUTH DAILY WITH SUPPER. 30 tablet 5  . Zinc Acetate, Oral, (ZINC ACETATE PO) Take by mouth.     Current Facility-Administered Medications  Medication Dose Route Frequency Provider Last Rate Last Admin  . 0.9 %  sodium chloride infusion  500 mL Intravenous Continuous Iva Boop, MD        Allergies:   Simvastatin and Statins   Social History:  The patient  reports that she quit smoking about 31 years ago. Her smoking use included cigarettes. She has a 12.50 pack-year smoking history. She has never used smokeless tobacco. She reports current alcohol use. She reports that she does not use drugs.   Family History:  The patient's family history includes Alcohol abuse in her father; Breast cancer (age of onset: 30) in her cousin; Diabetes in her paternal grandmother; Heart attack in her mother; Hyperlipidemia in her brother; Hypertension in her brother, mother, and paternal grandmother; Liver cancer (age of onset: 38) in her paternal aunt; Ovarian cancer (age of onset: 12) in her maternal aunt; Ovarian cancer (age of onset: 66) in her cousin; Pancreatic cancer (age of onset: 70) in her maternal grandfather.  ROS:  Please see the history of present illness.    All other systems are reviewed and otherwise negative.   PHYSICAL EXAM:  VS:  BP 128/78   Pulse 68   Ht 5\' 5"  (1.651 m)   Wt 213 lb (96.6 kg)   BMI 35.45 kg/m  BMI: Body mass index is 35.45 kg/m. Well nourished, well developed, in no acute distress  HEENT: normocephalic, atraumatic  Neck: no JVD, carotid bruits or masses Cardiac:  irreg-irreg ; no significant murmurs, no rubs, or gallops Lungs:  CTA b/l, no wheezing, rhonchi or rales  Abd: soft, nontender MS: no deformity or atrophy Ext: trace  edema b/l Skin: warm and dry, no rash Neuro:  No gross deficits appreciated Psych: euthymic mood, full affect     EKG:  Done today and reviewed by myself shows  Afib 68bpm   05/13/2019; TTE IMPRESSIONS  1. Left ventricular ejection fraction, by estimation, is 60 to 65%. The  left ventricle has normal function. The left ventricle has no regional  wall motion abnormalities. Left ventricular diastolic function could not  be evaluated.  2. Right ventricular systolic function is normal. The right ventricular  size is normal. There is moderately elevated  pulmonary artery systolic  pressure. The estimated right ventricular systolic pressure is 42.6 mmHg.  3. Left atrial size was moderately dilated.  4. Right atrial size was mildly dilated.  5. The mitral valve is normal in structure. Mild mitral valve  regurgitation. No evidence of mitral stenosis.  6. Tricuspid valve regurgitation is mild to moderate.  7. The aortic valve is normal in structure. Aortic valve regurgitation is  not visualized. No aortic stenosis is present.  8. The inferior vena cava is dilated in size with >50% respiratory  variability, suggesting right atrial pressure of 8 mmHg.   Recent Labs: 05/13/2019: BUN 13; Creatinine, Ser 0.95; Hemoglobin 12.5; Platelets 215; Potassium 3.9; Sodium 140  No results found for requested labs within last 8760 hours.   CrCl cannot be calculated (Patient's most recent lab result is older than the maximum 21 days allowed.).   Wt Readings from Last 3 Encounters:  06/24/19 213 lb (96.6 kg)  06/24/19 215 lb 3.2 oz (97.6 kg)  04/20/19 209 lb (94.8 kg)     Other studies reviewed: Additional studies/records reviewed today include: summarized above  ASSESSMENT AND PLAN:  1. Persistent AFib     CHA2DS2Vasc is 3, on Xarelto,  appropriately dosed     No clear symptoms  We discussed DCCV, she has had one 2014, revisited the procedure, potential risks and benefits, she is  agreeable to proceed We discussed post DCCV if successful make note if she had any notable improvement in overall well being, energy, so on If not would not expect that we would be overly aggressive in management the future if/when recurrent.  Discussed importance of not missing her xarelto   2. HTN     Looks good   Disposition: F/u with [place] about 6 weeks post DCCV    Current medicines are reviewed at length with the patient today.  The patient did not have any concerns regarding medicines.  Norma Fredrickson, PA-C 06/24/2019 1:48 PM     [facility] [address] [place] [address] [phone] (office)  [phone] (fax)

## 2019-06-24 NOTE — Patient Instructions (Addendum)
Medication Instructions:   Your physician recommends that you continue on your current medications as directed. Please refer to the Current Medication list given to you today.  *If you need a refill on your cardiac medications before your next appointment, please call your pharmacy*   Lab Work:  [place]   If you have labs (blood work) drawn today and your tests are completely normal, you will receive your results only by: Marland Kitchen MyChart Message (if you have MyChart) OR . A paper copy in the mail If you have any lab test that is abnormal or we need to change your treatment, we will call you to review the results.   Testing/Procedures: Your physician has recommended that you have a Cardioversion (DCCV). Electrical Cardioversion uses a jolt of electricity to your heart either through paddles or wired patches attached to your chest. This is a controlled, usually prescheduled, procedure. Defibrillation is done under light anesthesia in the hospital, and you usually go home the day of the procedure. This is done to get your heart back into a normal rhythm. You are not awake for the procedure. Please see the instruction sheet given to you today.   Follow-Up: At [facility], you and your health needs are our priority.  As part of our continuing mission to provide you with exceptional heart care, we have created designated Provider Care Teams.  These Care Teams include your primary Cardiologist (physician) and Advanced Practice Providers (APPs -  Physician Assistants and Nurse Practitioners) who all work together to provide you with the care you need, when you need it.  We recommend signing up for the patient portal called "MyChart".  Sign up information is provided on this After Visit Summary.  MyChart is used to connect with patients for Virtual Visits (Telemedicine).  Patients are able to view lab/test results, encounter notes, upcoming appointments, etc.  Non-urgent messages can be sent to  your provider as well.   To learn more about what you can do with MyChart, go to [email or web address].    Your next appointment:  6 weeks post Cardioversion  The format for your next appointment:   In Person  Provider:   You may see Dr. Caryl Comes or one of the following Advanced Practice Providers on your designated Care Team:    Chanetta Marshall, NP  Tommye Standard, PA-C  Legrand Como "Oda Kilts, [place]    Other Instructions

## 2019-06-25 ENCOUNTER — Telehealth: Payer: Self-pay | Admitting: Hematology and Oncology

## 2019-06-25 LAB — BASIC METABOLIC PANEL
BUN/Creatinine Ratio: 14 (ref 12–28)
BUN: 12 mg/dL (ref 8–27)
CO2: 28 mmol/L (ref 20–29)
Calcium: 9.2 mg/dL (ref 8.7–10.3)
Chloride: 104 mmol/L (ref 96–106)
Creatinine, Ser: 0.87 mg/dL (ref 0.57–1.00)
GFR calc Af Amer: 81 mL/min/{1.73_m2} (ref >59)
GFR calc non Af Amer: 71 mL/min/{1.73_m2} (ref >59)
Glucose: 77 mg/dL (ref 65–99)
Potassium: 4 mmol/L (ref 3.5–5.2)
Sodium: 143 mmol/L (ref 134–144)

## 2019-06-25 LAB — CBC
Hematocrit: 41 % (ref 34.0–46.6)
Hemoglobin: 13.4 g/dL (ref 11.1–15.9)
MCH: 27.5 pg (ref 26.6–33.0)
MCHC: 32.7 g/dL (ref 31.5–35.7)
MCV: 84 fL (ref 79–97)
Platelets: 237 10*3/uL (ref 150–450)
RBC: 4.87 x10E6/uL (ref 3.77–5.28)
RDW: 13.9 % (ref 11.7–15.4)
WBC: 5.3 10*3/uL (ref 3.4–10.8)

## 2019-06-25 NOTE — Telephone Encounter (Signed)
Scheduled per 05/12 los, patient has been called and notified.

## 2019-07-06 ENCOUNTER — Other Ambulatory Visit (HOSPITAL_COMMUNITY)
Admission: RE | Admit: 2019-07-06 | Discharge: 2019-07-06 | Disposition: A | Discharge status: Home / Self Care | Payer: 59 | Source: Ambulatory Visit | Attending: Cardiology | Admitting: Cardiology

## 2019-07-06 ENCOUNTER — Other Ambulatory Visit (HOSPITAL_COMMUNITY): Payer: 59

## 2019-07-06 DIAGNOSIS — Z20822 Contact with and (suspected) exposure to covid-19: Secondary | ICD-10-CM | POA: Insufficient documentation

## 2019-07-06 DIAGNOSIS — Z01812 Encounter for preprocedural laboratory examination: Secondary | ICD-10-CM | POA: Insufficient documentation

## 2019-07-06 LAB — SARS CORONAVIRUS 2 (TAT 6-24 HRS): SARS Coronavirus 2: NEGATIVE

## 2019-07-07 ENCOUNTER — Telehealth: Payer: Self-pay | Admitting: Internal Medicine

## 2019-07-07 NOTE — Telephone Encounter (Signed)
New Message    Pt is calling and is wondering if her husband is allowed to stay with her while at the hospital for her cardioversion   Please advise

## 2019-07-09 ENCOUNTER — Encounter (HOSPITAL_COMMUNITY): Payer: Self-pay | Admitting: Cardiology

## 2019-07-09 ENCOUNTER — Encounter (HOSPITAL_COMMUNITY)
Admission: RE | Disposition: A | Discharge status: Home / Self Care | Payer: Self-pay | Source: Home / Self Care | Attending: Cardiology

## 2019-07-09 ENCOUNTER — Ambulatory Visit (HOSPITAL_COMMUNITY)
Admission: RE | Admit: 2019-07-09 | Discharge: 2019-07-09 | Disposition: A | Discharge status: Home / Self Care | Payer: 59 | Attending: Cardiology | Admitting: Cardiology

## 2019-07-09 ENCOUNTER — Ambulatory Visit (HOSPITAL_COMMUNITY): Payer: 59 | Admitting: Certified Registered Nurse Anesthetist

## 2019-07-09 DIAGNOSIS — Z9221 Personal history of antineoplastic chemotherapy: Secondary | ICD-10-CM | POA: Diagnosis not present

## 2019-07-09 DIAGNOSIS — E669 Obesity, unspecified: Secondary | ICD-10-CM | POA: Insufficient documentation

## 2019-07-09 DIAGNOSIS — G4733 Obstructive sleep apnea (adult) (pediatric): Secondary | ICD-10-CM | POA: Insufficient documentation

## 2019-07-09 DIAGNOSIS — Z7901 Long term (current) use of anticoagulants: Secondary | ICD-10-CM | POA: Diagnosis not present

## 2019-07-09 DIAGNOSIS — Z6834 Body mass index (BMI) 34.0-34.9, adult: Secondary | ICD-10-CM | POA: Insufficient documentation

## 2019-07-09 DIAGNOSIS — E119 Type 2 diabetes mellitus without complications: Secondary | ICD-10-CM | POA: Insufficient documentation

## 2019-07-09 DIAGNOSIS — E785 Hyperlipidemia, unspecified: Secondary | ICD-10-CM | POA: Diagnosis not present

## 2019-07-09 DIAGNOSIS — Z79899 Other long term (current) drug therapy: Secondary | ICD-10-CM | POA: Diagnosis not present

## 2019-07-09 DIAGNOSIS — E039 Hypothyroidism, unspecified: Secondary | ICD-10-CM | POA: Diagnosis not present

## 2019-07-09 DIAGNOSIS — I10 Essential (primary) hypertension: Secondary | ICD-10-CM | POA: Diagnosis not present

## 2019-07-09 DIAGNOSIS — Z87891 Personal history of nicotine dependence: Secondary | ICD-10-CM | POA: Insufficient documentation

## 2019-07-09 DIAGNOSIS — I4819 Other persistent atrial fibrillation: Secondary | ICD-10-CM | POA: Diagnosis present

## 2019-07-09 DIAGNOSIS — Z79890 Hormone replacement therapy (postmenopausal): Secondary | ICD-10-CM | POA: Diagnosis not present

## 2019-07-09 DIAGNOSIS — Z853 Personal history of malignant neoplasm of breast: Secondary | ICD-10-CM | POA: Insufficient documentation

## 2019-07-09 HISTORY — PX: CARDIOVERSION: SHX1299

## 2019-07-09 LAB — POCT I-STAT, CHEM 8
BUN: 18 mg/dL (ref 8–23)
Calcium, Ion: 1.16 mmol/L (ref 1.15–1.40)
Chloride: 104 mmol/L (ref 98–111)
Creatinine, Ser: 1 mg/dL (ref 0.44–1.00)
Glucose, Bld: 80 mg/dL (ref 70–99)
HCT: 41 % (ref 36.0–46.0)
Hemoglobin: 13.9 g/dL (ref 12.0–15.0)
Potassium: 4.5 mmol/L (ref 3.5–5.1)
Sodium: 140 mmol/L (ref 135–145)
TCO2: 30 mmol/L (ref 22–32)

## 2019-07-09 LAB — GLUCOSE, CAPILLARY
Glucose-Capillary: 118 mg/dL — ABNORMAL HIGH (ref 70–99)
Glucose-Capillary: 65 mg/dL — ABNORMAL LOW (ref 70–99)

## 2019-07-09 SURGERY — CARDIOVERSION
Anesthesia: General

## 2019-07-09 MED ORDER — LIDOCAINE 2% (20 MG/ML) 5 ML SYRINGE
INTRAMUSCULAR | Status: DC | PRN
  Administered 2019-07-09: 40 mg via INTRAVENOUS

## 2019-07-09 MED ORDER — PROPOFOL 10 MG/ML IV BOLUS
INTRAVENOUS | Status: DC | PRN
  Administered 2019-07-09: 60 mg via INTRAVENOUS

## 2019-07-09 MED ORDER — SODIUM CHLORIDE 0.9 % IV SOLN
INTRAVENOUS | Status: DC
  Administered 2019-07-09: 500 mL via INTRAVENOUS

## 2019-07-09 MED ORDER — DEXTROSE 50 % IV SOLN
25.0000 mL | Freq: Once | INTRAVENOUS | Status: AC
  Administered 2019-07-09: 25 mL via INTRAVENOUS

## 2019-07-09 MED ORDER — DEXTROSE 50 % IV SOLN
INTRAVENOUS | Status: AC
  Filled 2019-07-09: qty 50

## 2019-07-09 NOTE — Anesthesia Postprocedure Evaluation (Signed)
Anesthesia Post Note  Patient: Jessica Mendoza  Procedure(s) Performed: CARDIOVERSION (N/A )     Patient location during evaluation: PACU Anesthesia Type: General Level of consciousness: awake and alert Pain management: pain level controlled Vital Signs Assessment: post-procedure vital signs reviewed and stable Respiratory status: spontaneous breathing, nonlabored ventilation, respiratory function stable and patient connected to nasal cannula oxygen Cardiovascular status: blood pressure returned to baseline and stable Postop Assessment: no apparent nausea or vomiting Anesthetic complications: no    Last Vitals:  Vitals:   07/09/19 1015 07/09/19 1025  BP: 134/73 (!) 145/82  Pulse: (!) 53 (!) 55  Resp: 19 19  Temp:    SpO2: 100% 100%    Last Pain:  Vitals:   07/09/19 0903  TempSrc: Oral  PainSc: 0-No pain                 Effie Berkshire

## 2019-07-09 NOTE — Anesthesia Procedure Notes (Signed)
Procedure Name: General with mask airway Date/Time: 07/09/2019 9:51 AM Performed by: Janene Harvey, CRNA Pre-anesthesia Checklist: Patient identified, Emergency Drugs available, Suction available and Patient being monitored Patient Re-evaluated:Patient Re-evaluated prior to induction Oxygen Delivery Method: Ambu bag Placement Confirmation: positive ETCO2 Dental Injury: Teeth and Oropharynx as per pre-operative assessment

## 2019-07-09 NOTE — Interval H&P Note (Signed)
History and Physical Interval Note:  07/09/2019 8:51 AM  Jessica Mendoza  has presented today for surgery, with the diagnosis of A-FIB.  The various methods of treatment have been discussed with the patient and family. After consideration of risks, benefits and other options for treatment, the patient has consented to  Procedure(s): CARDIOVERSION (N/A) as a surgical intervention.  The patient's history has been reviewed, patient examined, no change in status, stable for surgery.  I have reviewed the patient's chart and labs.  Questions were answered to the patient's satisfaction.     Kirk Ruths

## 2019-07-09 NOTE — Transfer of Care (Signed)
Immediate Anesthesia Transfer of Care Note  Patient: Jessica Mendoza  Procedure(s) Performed: CARDIOVERSION (N/A )  Patient Location: Endoscopy Unit  Anesthesia Type:General  Level of Consciousness: drowsy  Airway & Oxygen Therapy: Patient Spontanous Breathing and Patient connected to nasal cannula oxygen  Post-op Assessment: Report given to RN and Post -op Vital signs reviewed and stable  Post vital signs: Reviewed  Last Vitals:  Vitals Value Taken Time  BP    Temp    Pulse 61 07/09/19 1000  Resp 22 07/09/19 1000  SpO2 98 % 07/09/19 1000  Vitals shown include unvalidated device data.  Last Pain:  Vitals:   07/09/19 0903  TempSrc: Oral  PainSc: 0-No pain         Complications: No apparent anesthesia complications

## 2019-07-09 NOTE — Procedures (Signed)
Electrical Cardioversion Procedure Note ERNIE TUCCIO KN:[id number] Nov 05, 1955  Procedure: Electrical Cardioversion Indications:  Atrial Fibrillation  Procedure Details Consent: Risks of procedure as well as the alternatives and risks of each were explained to the (patient/caregiver).  Consent for procedure obtained. Time Out: Verified patient identification, verified procedure, site/side was marked, verified correct patient position, special equipment/implants available, medications/allergies/relevent history reviewed, required imaging and test results available.  Performed  Patient placed on cardiac monitor, pulse oximetry, supplemental oxygen as necessary.  Sedation given: Pt sedated by anesthesia with lidocaine 40 mg and diprovan 60 mg IV. Pacer pads placed anterior and posterior chest.  Cardioverted 1 time(s).  Cardioverted at [place].  Evaluation Findings: Post procedure EKG shows: NSR Complications: None Patient did tolerate procedure well.   Kirk Ruths 07/09/2019, 8:53 AM

## 2019-07-09 NOTE — H&P (Signed)
Progress Notes by Baldwin Jamaica, PA-C at 06/24/2019 1:00 PM Author: Baldwin Jamaica, PA-C Author Type: Physician Assistant Certified Filed: 5/00/3704 1:54 PM  Note Status: Signed Cosign: Cosign Not Required Encounter Date: 06/24/2019  Editor: Charlynn Grimes (Physician Assistant Certified)       Cardiology Office Note Date:  06/24/2019  Patient ID:  Jessica, Mendoza May 23, 1955, MRN [id number] PCP:  Gaynelle Arabian, MD     Electrophysiologist:  Dr. Caryl Comes     Chief Complaint:  needs DCCV  History of Present Illness: Jessica Mendoza is a 64 y.o. female with history of breast cancer (lumpectomy, radioactive seed placement, chemo), HTN, DM, hypothyroidism, OSA (she reports after losing about 30lbs, retested and negative), HLD, and AFib. Family hx of HCM (her echos have been w/o LVH)   She saw A. Larrie Kass, at that visit she was in rate controlled AF and unaware.  Feeling well, exercising regularly.  No plans for rhythm control given no symptoms  She last saw Dr. Caryl Comes March 2021, at that time described as symptomatic with exercise intolerance (though this comment is mentioned on all notes), planned to pursue rhythm control mentioning young age as part of decision as well, planned to pursue updated and DCCV.  She continues to do well.  No notable symptoms with her AFib.   No CP cardiac awareness, palpitations.  She no near syncope or syncope.  She continues to exercise regularly via [employer] and [employer] program,  Ends up to be 5 days a week.  At peak exercise she is winded, but no exertional incapacities.  She is very compliant with her her medicines, no missed doses of her Xarelto in the last 3 weeks if ever.  No bleeding or signs of bleeding  AFib hx 2014  AAD Hx None to date       Past Medical History:  Diagnosis Date  . Afib ([place])   . Arthritis    WRISTS AND NECK  . Benign positional vertigo   . Bloating   . Breast cancer ([place])  2016   ER+/PR-/Her2-  . Colon polyps 12/2005  . Diabetes mellitus, type 2 ([place]) 06/2010  . Essential hypertension, benign   . Family history of hypertrophic cardiomyopathy   . Family history of ovarian cancer   . Family history of pancreatic cancer   . Focal nodular hyperplasia of liver    bx 2002  . Glaucoma   . Goiter 10/2006  . Hot flashes   . Hypothyroidism   . Migraines    menstrual  . Obesity   . OSA (obstructive sleep apnea)   . Other and unspecified hyperlipidemia   . Sleep apnea          Past Surgical History:  Procedure Laterality Date  . BREAST LUMPECTOMY     right breast- benign  . BREAST LUMPECTOMY WITH RADIOACTIVE SEED AND SENTINEL LYMPH NODE BIOPSY Right 11/25/2014   Procedure: RIGHT BREAST LUMPECTOMY WITH RADIOACTIVE SEED AND RIGHT AXILLARY SENTINEL LYMPH NODE BIOPSY;  Surgeon: Excell Seltzer, MD;  Location: [place];  Service: General;  Laterality: Right;  . CARDIOVERSION N/A 07/11/2012   Procedure: CARDIOVERSION;  Surgeon: Larey Dresser, MD;  Location: [place];  Service: Cardiovascular;  Laterality: N/A;  . LIVER BIOPSY  2002          Current Outpatient Medications  Medication Sig Dispense Refill  . amLODipine (NORVASC) 5 MG tablet TAKE 1 TABLET BY MOUTH  DAILY 90 tablet  3  . anastrozole (ARIMIDEX) 1 MG tablet Take 1 tablet (1 mg total) by mouth daily. 90 tablet 3  . Cholecalciferol (VITAMIN D-3) 1000 UNITS CAPS Take 1 capsule by mouth 3 (three) times a week.    . Coenzyme Q10 (CO Q 10) 100 MG CAPS Take by mouth daily.     . Collagen Hydrolysate POWD 1 scoop by Does not apply route daily.  0  . estradiol (ESTRACE VAGINAL) 0.1 MG/GM vaginal cream Place 1 Applicatorful vaginally at bedtime. 42.5 g 12  . levothyroxine (SYNTHROID, LEVOTHROID) 25 MCG tablet Take 25 mcg by mouth daily. Dr Suzette Battiest sees pt for thyroid    . metFORMIN (GLUCOPHAGE-XR) 500 MG 24 hr tablet Take 1 tablet (500 mg total) by mouth  daily with breakfast. NO MORE REFILLS WITHOUT OFFICE VISIT - 2ND NOTICE (Patient taking differently: Take 500 mg by mouth daily with breakfast. Dr. Marisue Humble fills this for pt) 15 tablet 0  . metoprolol tartrate (LOPRESSOR) 50 MG tablet Take 1 tablet (50 mg total) by mouth 2 (two) times daily.    . NON FORMULARY 5,000 mg daily. Biotin    . NON FORMULARY 50 mg daily. Zinc    . ONETOUCH VERIO test strip CHECK BS BID  7  . OXYBUTYNIN CHLORIDE PO Take 10 mg by mouth daily.    . potassium chloride SA (KLOR-CON) 20 MEQ tablet Take 20 mEq by mouth daily.    Alveda Reasons 20 MG TABS tablet TAKE 1 TABLET (20 MG TOTAL) BY MOUTH DAILY WITH SUPPER. 30 tablet 5  . Zinc Acetate, Oral, (ZINC ACETATE PO) Take by mouth.              Current Facility-Administered Medications  Medication Dose Route Frequency Provider Last Rate Last Admin  . 0.9 %  sodium chloride infusion  500 mL Intravenous Continuous Gatha Mayer, MD        Allergies:   Simvastatin and Statins   Social History:  The patient  reports that she quit smoking about 31 years ago. Her smoking use included cigarettes. She has a 12.50 pack-year smoking history. She has never used smokeless tobacco. She reports current alcohol use. She reports that she does not use drugs.   Family History:  The patient's family history includes Alcohol abuse in her father; Breast cancer (age of onset: 86) in her cousin; Diabetes in her paternal grandmother; Heart attack in her mother; Hyperlipidemia in her brother; Hypertension in her brother, mother, and paternal grandmother; Liver cancer (age of onset: 14) in her paternal aunt; Ovarian cancer (age of onset: 14) in her maternal aunt; Ovarian cancer (age of onset: 70) in her cousin; Pancreatic cancer (age of onset: 86) in her maternal grandfather.  ROS:  Please see the history of present illness.    All other systems are reviewed and otherwise negative.   PHYSICAL EXAM:  VS:  BP 128/78   Pulse 68    Ht '5\' 5"'  (1.651 m)   Wt 213 lb (96.6 kg)   BMI 35.45 kg/m  BMI: Body mass index is 35.45 kg/m. Well nourished, well developed, in no acute distress  HEENT: normocephalic, atraumatic  Neck: no JVD, carotid bruits or masses Cardiac:  irreg-irreg ; no significant murmurs, no rubs, or gallops Lungs:  CTA b/l, no wheezing, rhonchi or rales  Abd: soft, nontender MS: no deformity or atrophy Ext: trace edema b/l Skin: warm and dry, no rash Neuro:  No gross deficits appreciated Psych: euthymic mood, full affect  EKG:  Done today and reviewed by myself shows  Afib 68bpm   05/13/2019; TTE IMPRESSIONS  1. Left ventricular ejection fraction, by estimation, is 60 to 65%. The  left ventricle has normal function. The left ventricle has no regional  wall motion abnormalities. Left ventricular diastolic function could not  be evaluated.  2. Right ventricular systolic function is normal. The right ventricular  size is normal. There is moderately elevated pulmonary artery systolic  pressure. The estimated right ventricular systolic pressure is 10.9 mmHg.  3. Left atrial size was moderately dilated.  4. Right atrial size was mildly dilated.  5. The mitral valve is normal in structure. Mild mitral valve  regurgitation. No evidence of mitral stenosis.  6. Tricuspid valve regurgitation is mild to moderate.  7. The aortic valve is normal in structure. Aortic valve regurgitation is  not visualized. No aortic stenosis is present.  8. The inferior vena cava is dilated in size with >50% respiratory  variability, suggesting right atrial pressure of 8 mmHg.   Recent Labs: 05/13/2019: BUN 13; Creatinine, Ser 0.95; Hemoglobin 12.5; Platelets 215; Potassium 3.9; Sodium 140  No results found for requested labs within last 8760 hours.   CrCl cannot be calculated (Patient's most recent lab result is older than the maximum 21 days allowed.).      Wt Readings from Last 3 Encounters:    06/24/19 213 lb (96.6 kg)  06/24/19 215 lb 3.2 oz (97.6 kg)  04/20/19 209 lb (94.8 kg)     Other studies reviewed: Additional studies/records reviewed today include: summarized above  ASSESSMENT AND PLAN:  1. Persistent AFib     CHA2DS2Vasc is 3, on Xarelto,  appropriately dosed     No clear symptoms  We discussed DCCV, she has had one 2014, revisited the procedure, potential risks and benefits, she is agreeable to proceed We discussed post DCCV if successful make note if she had any notable improvement in overall well being, energy, so on If not would not expect that we would be overly aggressive in management the future if/when recurrent.  Discussed importance of not missing her xarelto   2. HTN     Looks good   Disposition: F/u with [place] about 6 weeks post DCCV    Current medicines are reviewed at length with the patient today.  The patient did not have any concerns regarding medicines.  Venetia Night, PA-C 06/24/2019 1:48 PM     [address] [place] [address] [phone] (office)  [phone] (fax)     For DCCV; compliant with xarelto; no changes Kirk Ruths

## 2019-07-09 NOTE — Anesthesia Preprocedure Evaluation (Addendum)
Anesthesia Evaluation  Patient identified by MRN, date of birth, ID band Patient awake    Reviewed: Allergy & Precautions, NPO status , Patient's Chart, lab work & pertinent test results  Airway Mallampati: II  TM Distance: >3 FB Neck ROM: Full    Dental  (+) Teeth Intact, Dental Advisory Given   Pulmonary sleep apnea , former smoker,    breath sounds clear to auscultation       Cardiovascular hypertension, Pt. on medications + dysrhythmias Atrial Fibrillation  Rhythm:Irregular Rate:Normal     Neuro/Psych  Headaches, negative psych ROS   GI/Hepatic negative GI ROS,   Endo/Other  diabetes, Type 2, Oral Hypoglycemic AgentsHypothyroidism   Renal/GU      Musculoskeletal  (+) Arthritis ,   Abdominal Normal abdominal exam  (+)   Peds  Hematology negative hematology ROS (+)   Anesthesia Other Findings   Reproductive/Obstetrics negative OB ROS                            Echo:  1. Left ventricular ejection fraction, by estimation, is 60 to 65%. The  left ventricle has normal function. The left ventricle has no regional  wall motion abnormalities. Left ventricular diastolic function could not  be evaluated.  2. Right ventricular systolic function is normal. The right ventricular  size is normal. There is moderately elevated pulmonary artery systolic  pressure. The estimated right ventricular systolic pressure is [id number] mmHg.  3. Left atrial size was moderately dilated.  4. Right atrial size was mildly dilated.  5. The mitral valve is normal in structure. Mild mitral valve  regurgitation. No evidence of mitral stenosis.  6. Tricuspid valve regurgitation is mild to moderate.  7. The aortic valve is normal in structure. Aortic valve regurgitation is  not visualized. No aortic stenosis is present.  8. The inferior vena cava is dilated in size with >50% respiratory  variability, suggesting  right atrial pressure of 8 mmHg.   Anesthesia Physical Anesthesia Plan  ASA: III  Anesthesia Plan: General   Post-op Pain Management:    Induction: Intravenous  PONV Risk Score and Plan: 0 and Propofol infusion  Airway Management Planned: Natural Airway and Simple Face Mask  Additional Equipment: None  Intra-op Plan:   Post-operative Plan:   Informed Consent: I have reviewed the patients History and Physical, chart, labs and discussed the procedure including the risks, benefits and alternatives for the proposed anesthesia with the patient or authorized representative who has indicated his/her understanding and acceptance.       Plan Discussed with: CRNA  Anesthesia Plan Comments:        Anesthesia Quick Evaluation

## 2019-08-17 NOTE — Progress Notes (Signed)
Cardiology Office Note Date:  08/19/2019  Patient ID:  Jessica Mendoza [birth year], MRN [id number] PCP:  Gaynelle Arabian, MD  Electrophysiologist:  Dr. Caryl Comes     Chief Complaint:  needs DCCV  History of Present Illness: Jessica Mendoza is a 64 y.o. female with history of breast cancer (lumpectomy, radioactive seed placement, chemo), HTN, DM, hypothyroidism, OSA (she reports after losing about 30lbs, retested and negative), HLD, and AFib. Family hx of HCM (her echos have been w/o LVH)   She saw A. Larrie Kass, at that visit she was in rate controlled AF and unaware.  Feeling well, exercising regularly.  No plans for rhythm control given no symptoms  She last saw Dr. Caryl Comes March 2021, at that time described as symptomatic with exercise intolerance (though this comment is mentioned on all notes), planned to pursue rhythm control mentioning young age as part of decision as well, planned to pursue updated and DCCV.  06/24/2019 She continues to do well.  No notable symptoms with her AFib.   No CP cardiac awareness, palpitations.  She no near syncope or syncope.  She continues to exercise regularly via [employer] and [employer] program,  Ends up to be 5 days a week.  At peak exercise she is winded, but no exertional incapacities. She is very compliant with her her medicines, no missed doses of her Xarelto in the last 3 weeks if ever. No bleeding or signs of bleeding We discussed DCCV and rhythm control attempt she was agreeable, asked to try and identify, if able to restore SR if she felt improved/diffierent in any way.  DCCV 07/09/19 DCCV was succesful  TODAY She is doing very well.  She does feel like her exertional capacity is improved, feels stronger during her workouts and does not get as winded. She missed one dose of her xarelto 2/2 to some car trouble and unable to get back to town one evening.  She denies any symptoms, feels quite well.   AFib hx 2014  AAD Hx None to  date   Past Medical History:  Diagnosis Date  . Afib ([place])   . Arthritis    WRISTS AND NECK  . Benign positional vertigo   . Bloating   . Breast cancer ([place]) 2016   ER+/PR-/Her2-  . Colon polyps 12/2005  . Diabetes mellitus, type 2 ([place]) 06/2010  . Essential hypertension, benign   . Family history of hypertrophic cardiomyopathy   . Family history of ovarian cancer   . Family history of pancreatic cancer   . Focal nodular hyperplasia of liver    bx 2002  . Glaucoma   . Goiter 10/2006  . Hot flashes   . Hypothyroidism   . Migraines    menstrual  . Obesity   . OSA (obstructive sleep apnea)   . Other and unspecified hyperlipidemia   . Sleep apnea     Past Surgical History:  Procedure Laterality Date  . BREAST LUMPECTOMY     right breast- benign  . BREAST LUMPECTOMY WITH RADIOACTIVE SEED AND SENTINEL LYMPH NODE BIOPSY Right 11/25/2014   Procedure: RIGHT BREAST LUMPECTOMY WITH RADIOACTIVE SEED AND RIGHT AXILLARY SENTINEL LYMPH NODE BIOPSY;  Surgeon: Excell Seltzer, MD;  Location: [facility];  Service: General;  Laterality: Right;  . CARDIOVERSION N/A 07/11/2012   Procedure: CARDIOVERSION;  Surgeon: Larey Dresser, MD;  Location: [facility] ENDOSCOPY;  Service: Cardiovascular;  Laterality: N/A;  . CARDIOVERSION N/A 07/09/2019   Procedure: CARDIOVERSION;  Surgeon:  Lelon Perla, MD;  Location: [facility] ENDOSCOPY;  Service: Cardiovascular;  Laterality: N/A;  . LIVER BIOPSY  2002    Current Outpatient Medications  Medication Sig Dispense Refill  . Alpha Lipoic Acid 200 MG CAPS Take 200 mg by mouth daily before breakfast.    . amLODipine (NORVASC) 5 MG tablet TAKE 1 TABLET BY MOUTH  DAILY (Patient taking differently: Take 5 mg by mouth at bedtime. ) 90 tablet 3  . anastrozole (ARIMIDEX) 1 MG tablet Take 1 tablet (1 mg total) by mouth daily. (Patient taking differently: Take 1 mg by mouth at bedtime. ) 90 tablet 3  . Biotin 5000 MCG CAPS Take 5,000 mcg by mouth at  bedtime.    . Cholecalciferol (VITAMIN D-3) 1000 UNITS CAPS Take 1,000 Units by mouth at bedtime.     . Coenzyme Q10 (CO Q 10) 100 MG CAPS Take 100 mg by mouth at bedtime.     . Collagen Hydrolysate POWD 1 scoop by Does not apply route daily. (Patient taking differently: Take 1 scoop by mouth 3 (three) times a week. In the morning with warm lemon water)  0  . estradiol (ESTRACE VAGINAL) 0.1 MG/GM vaginal cream Place 1 Applicatorful vaginally at bedtime. (Patient taking differently: Place 1 Applicatorful vaginally at bedtime as needed (discomfort/irritation/dryness.). ) 42.5 g 12  . levothyroxine (SYNTHROID, LEVOTHROID) 25 MCG tablet Take 25 mcg by mouth daily before breakfast. Dr Suzette Battiest sees pt for thyroid    . metFORMIN (GLUCOPHAGE-XR) 500 MG 24 hr tablet Take 1 tablet (500 mg total) by mouth daily with breakfast. NO MORE REFILLS WITHOUT OFFICE VISIT - 2ND NOTICE (Patient taking differently: Take 500 mg by mouth every evening. Dr. Marisue Humble fills this for pt) 15 tablet 0  . metoprolol tartrate (LOPRESSOR) 50 MG tablet Take 1 tablet (50 mg total) by mouth 2 (two) times daily.    Glory Rosebush VERIO test strip CHECK BS BID  7  . oxybutynin (DITROPAN XL) 15 MG 24 hr tablet Take 15 mg by mouth at bedtime.    . potassium chloride SA (KLOR-CON) 20 MEQ tablet Take 20 mEq by mouth daily.    . Red Yeast Rice 600 MG CAPS Take 600 mg by mouth at bedtime.    Alveda Reasons 20 MG TABS tablet TAKE 1 TABLET (20 MG TOTAL) BY MOUTH DAILY WITH SUPPER. (Patient taking differently: Take 20 mg by mouth at bedtime. ) 30 tablet 5  . Zinc 50 MG TABS Take 50 mg by mouth at bedtime.     Current Facility-Administered Medications  Medication Dose Route Frequency Provider Last Rate Last Admin  . 0.9 %  sodium chloride infusion  500 mL Intravenous Continuous Gatha Mayer, MD        Allergies:   Simvastatin and Statins   Social History:  The patient  reports that she quit smoking about 31 years ago. Her smoking use included  cigarettes. She has a 12.50 pack-year smoking history. She has never used smokeless tobacco. She reports current alcohol use. She reports that she does not use drugs.   Family History:  The patient's family history includes Alcohol abuse in her father; Breast cancer (age of onset: 47) in her cousin; Diabetes in her paternal grandmother; Heart attack in her mother; Hyperlipidemia in her brother; Hypertension in her brother, mother, and paternal grandmother; Liver cancer (age of onset: 45) in her paternal aunt; Ovarian cancer (age of onset: 37) in her maternal aunt; Ovarian cancer (age of onset: 95) in her  cousin; Pancreatic cancer (age of onset: 36) in her maternal grandfather.  ROS:  Please see the history of present illness.    All other systems are reviewed and otherwise negative.   PHYSICAL EXAM:  VS:  BP 118/68   Pulse 73   Ht _0  (1.651 m)   Wt 212 lb (96.2 kg)   BMI 35.28 kg/m  BMI: Body mass index is 35.28 kg/m. Well nourished, well developed, in no acute distress  HEENT: normocephalic, atraumatic  Neck: no JVD, carotid bruits or masses Cardiac:  RRR ; no significant murmurs, no rubs, or gallops Lungs:  CTA b/l, no wheezing, rhonchi or rales  Abd: soft, nontender, obese MS: no deformity or atrophy Ext: trace edema b/l Skin: warm and dry, no rash Neuro:  No gross deficits appreciated Psych: euthymic mood, full affect     EKG:  Done today and reviewed by myself shows  SR, 73bpm, normal EKG   05/13/2019; TTE IMPRESSIONS  1. Left ventricular ejection fraction, by estimation, is 60 to 65%. The  left ventricle has normal function. The left ventricle has no regional  wall motion abnormalities. Left ventricular diastolic function could not  be evaluated.  2. Right ventricular systolic function is normal. The right ventricular  size is normal. There is moderately elevated pulmonary artery systolic  pressure. The estimated right ventricular systolic pressure is 40.3 mmHg.   3. Left atrial size was moderately dilated.  4. Right atrial size was mildly dilated.  5. The mitral valve is normal in structure. Mild mitral valve  regurgitation. No evidence of mitral stenosis.  6. Tricuspid valve regurgitation is mild to moderate.  7. The aortic valve is normal in structure. Aortic valve regurgitation is  not visualized. No aortic stenosis is present.  8. The inferior vena cava is dilated in size with >50% respiratory  variability, suggesting right atrial pressure of 8 mmHg.   Recent Labs: 06/24/2019: Platelets 237 07/09/2019: BUN 18; Creatinine, Ser 1.00; Hemoglobin 13.9; Potassium 4.5; Sodium 140  No results found for requested labs within last 8760 hours.   CrCl cannot be calculated (Patient's most recent lab result is older than the maximum 21 days allowed.).   Wt Readings from Last 3 Encounters:  08/19/19 212 lb (96.2 kg)  07/09/19 206 lb (93.4 kg)  06/24/19 213 lb (96.6 kg)     Other studies reviewed: Additional studies/records reviewed today include: summarized above  ASSESSMENT AND PLAN:  1. Persistent AFib     CHA2DS2Vasc is 3, on Xarelto,  appropriately dosed     Maintaining SR post DCCV  2. HTN     Looks good   Disposition: will have her see the Afib clinic in 46mo to establish care there as well, will be an excellent resource for her.    Current medicines are reviewed at length with the patient today.  The patient did not have any concerns regarding medicines.  SVenetia Night PA-C 08/19/2019 1:34 PM     [place] [phone])  [phone](fax)

## 2019-08-19 ENCOUNTER — Ambulatory Visit: Payer: 59 | Admitting: Physician Assistant

## 2019-08-19 ENCOUNTER — Other Ambulatory Visit: Payer: Self-pay

## 2019-08-19 VITALS — BP 118/68 | HR 73 | Ht 65.0 in | Wt 212.0 lb

## 2019-08-19 DIAGNOSIS — I10 Essential (primary) hypertension: Secondary | ICD-10-CM

## 2019-08-19 DIAGNOSIS — I4819 Other persistent atrial fibrillation: Secondary | ICD-10-CM

## 2019-08-19 NOTE — Patient Instructions (Signed)
Medication Instructions:  Your physician recommends that you continue on your current medications as directed. Please refer to the Current Medication list given to you today.  *If you need a refill on your cardiac medications before your next appointment, please call your pharmacy*   Lab Work: [place]  If you have labs (blood work) drawn today and your tests are completely normal, you will receive your results only by: Marland Kitchen MyChart Message (if you have MyChart) OR . A paper copy in the mail If you have any lab test that is abnormal or we need to change your treatment, we will call you to review the results.   Testing/Procedures: NONE ORDERED  TODAY   Follow-Up: At [facility], you and your health needs are our priority.  As part of our continuing mission to provide you with exceptional heart care, we have created designated Provider Care Teams.  These Care Teams include your primary Cardiologist (physician) and Advanced Practice Providers (APPs -  Physician Assistants and Nurse Practitioners) who all work together to provide you with the care you need, when you need it.  We recommend signing up for the patient portal called "MyChart".  Sign up information is provided on this After Visit Summary.  MyChart is used to connect with patients for Virtual Visits (Telemedicine).  Patients are able to view lab/test results, encounter notes, upcoming appointments, etc.  Non-urgent messages can be sent to your provider as well.   To learn more about what you can do with MyChart, go to [email or web address].    Your next appointment:   3 month(s)  The format for your next appointment:   In Person  Provider:   AFIB CLINIC    Other Instructions

## 2019-09-22 ENCOUNTER — Telehealth: Payer: Self-pay | Admitting: Hematology and Oncology

## 2019-09-22 NOTE — Telephone Encounter (Signed)
Rescheduled appointment per 8/5 provider message. Patient is aware of updated appointment time.

## 2019-11-04 ENCOUNTER — Encounter: Payer: Self-pay | Admitting: Hematology and Oncology

## 2019-11-25 ENCOUNTER — Ambulatory Visit (HOSPITAL_COMMUNITY): Payer: 59 | Admitting: Nurse Practitioner

## 2019-12-02 ENCOUNTER — Encounter (HOSPITAL_COMMUNITY): Payer: Self-pay | Admitting: Nurse Practitioner

## 2019-12-02 ENCOUNTER — Ambulatory Visit (HOSPITAL_COMMUNITY)
Admission: RE | Admit: 2019-12-02 | Discharge: 2019-12-02 | Disposition: A | Discharge status: Home / Self Care | Payer: Medicare Other | Source: Ambulatory Visit | Attending: Nurse Practitioner | Admitting: Nurse Practitioner

## 2019-12-02 ENCOUNTER — Other Ambulatory Visit: Payer: Self-pay

## 2019-12-02 VITALS — BP 152/90 | HR 71 | Ht 65.0 in | Wt 218.8 lb

## 2019-12-02 DIAGNOSIS — E785 Hyperlipidemia, unspecified: Secondary | ICD-10-CM | POA: Insufficient documentation

## 2019-12-02 DIAGNOSIS — I10 Essential (primary) hypertension: Secondary | ICD-10-CM | POA: Diagnosis not present

## 2019-12-02 DIAGNOSIS — Z79890 Hormone replacement therapy (postmenopausal): Secondary | ICD-10-CM | POA: Diagnosis not present

## 2019-12-02 DIAGNOSIS — E039 Hypothyroidism, unspecified: Secondary | ICD-10-CM | POA: Insufficient documentation

## 2019-12-02 DIAGNOSIS — Z8249 Family history of ischemic heart disease and other diseases of the circulatory system: Secondary | ICD-10-CM | POA: Insufficient documentation

## 2019-12-02 DIAGNOSIS — E669 Obesity, unspecified: Secondary | ICD-10-CM | POA: Diagnosis not present

## 2019-12-02 DIAGNOSIS — Z7984 Long term (current) use of oral hypoglycemic drugs: Secondary | ICD-10-CM | POA: Insufficient documentation

## 2019-12-02 DIAGNOSIS — E119 Type 2 diabetes mellitus without complications: Secondary | ICD-10-CM | POA: Insufficient documentation

## 2019-12-02 DIAGNOSIS — G4733 Obstructive sleep apnea (adult) (pediatric): Secondary | ICD-10-CM | POA: Insufficient documentation

## 2019-12-02 DIAGNOSIS — Z7901 Long term (current) use of anticoagulants: Secondary | ICD-10-CM | POA: Diagnosis not present

## 2019-12-02 DIAGNOSIS — Z79899 Other long term (current) drug therapy: Secondary | ICD-10-CM | POA: Diagnosis not present

## 2019-12-02 DIAGNOSIS — D6869 Other thrombophilia: Secondary | ICD-10-CM

## 2019-12-02 DIAGNOSIS — I480 Paroxysmal atrial fibrillation: Secondary | ICD-10-CM | POA: Insufficient documentation

## 2019-12-02 DIAGNOSIS — Z6836 Body mass index (BMI) 36.0-36.9, adult: Secondary | ICD-10-CM | POA: Diagnosis not present

## 2019-12-02 NOTE — Progress Notes (Addendum)
Primary Care Physician: Gaynelle Arabian, MD Referring Physician: Georgeanna Harrison    Jessica Mendoza is a 64 y.o. female with a h/o paroxysmal afib being seen in the afib clinic. She has done well since her last cardioversion in May of this year. Denies any afib awareness. She has gained around 6 lbs but plans to increas her exercise and modify her diet. She snores but denies apnea. Continues on xarelto 20 mg qd without any bleeding issues. CHA2DS2VASc score of 5.   Today, she denies symptoms of palpitations, chest pain, shortness of breath, orthopnea, PND, lower extremity edema, dizziness, presyncope, syncope, or neurologic sequela. The patient is tolerating medications without difficulties and is otherwise without complaint today.   Past Medical History:  Diagnosis Date  . Afib ([place])   . Arthritis    WRISTS AND NECK  . Benign positional vertigo   . Bloating   . Breast cancer ([place]) 2016   ER+/PR-/Her2-  . Colon polyps 12/2005  . Diabetes mellitus, type 2 ([place]) 06/2010  . Essential hypertension, benign   . Family history of hypertrophic cardiomyopathy   . Family history of ovarian cancer   . Family history of pancreatic cancer   . Focal nodular hyperplasia of liver    bx 2002  . Glaucoma   . Goiter 10/2006  . Hot flashes   . Hypothyroidism   . Migraines    menstrual  . Obesity   . OSA (obstructive sleep apnea)   . Other and unspecified hyperlipidemia   . Sleep apnea    Past Surgical History:  Procedure Laterality Date  . BREAST LUMPECTOMY     right breast- benign  . BREAST LUMPECTOMY WITH RADIOACTIVE SEED AND SENTINEL LYMPH NODE BIOPSY Right 11/25/2014   Procedure: RIGHT BREAST LUMPECTOMY WITH RADIOACTIVE SEED AND RIGHT AXILLARY SENTINEL LYMPH NODE BIOPSY;  Surgeon: Excell Seltzer, MD;  Location: [facility];  Service: General;  Laterality: Right;  . CARDIOVERSION N/A 07/11/2012   Procedure: CARDIOVERSION;  Surgeon: Larey Dresser, MD;  Location: [facility];  Service: Cardiovascular;  Laterality: N/A;  . CARDIOVERSION N/A 07/09/2019   Procedure: CARDIOVERSION;  Surgeon: Lelon Perla, MD;  Location: [facility] ENDOSCOPY;  Service: Cardiovascular;  Laterality: N/A;  . LIVER BIOPSY  2002    Current Outpatient Medications  Medication Sig Dispense Refill  . Alpha Lipoic Acid 200 MG CAPS Take 200 mg by mouth daily before breakfast.    . amLODipine (NORVASC) 5 MG tablet TAKE 1 TABLET BY MOUTH  DAILY (Patient taking differently: Take 5 mg by mouth at bedtime. ) 90 tablet 3  . anastrozole (ARIMIDEX) 1 MG tablet Take 1 tablet (1 mg total) by mouth daily. (Patient taking differently: Take 1 mg by mouth at bedtime. ) 90 tablet 3  . Biotin 5000 MCG CAPS Take 5,000 mcg by mouth at bedtime.    . Cholecalciferol (VITAMIN D-3) 1000 UNITS CAPS Take 1,000 Units by mouth at bedtime.     . Coenzyme Q10 (CO Q 10) 100 MG CAPS Take 100 mg by mouth at bedtime.     . Collagen Hydrolysate POWD 1 scoop by Does not apply route daily. (Patient taking differently: Take 1 scoop by mouth 3 (three) times a week. In the morning with warm lemon water)  0  . levothyroxine (SYNTHROID, LEVOTHROID) 25 MCG tablet Take 25 mcg by mouth daily before breakfast. Dr Suzette Battiest sees pt for thyroid    . metFORMIN (GLUCOPHAGE-XR) 500 MG 24 hr tablet Take 1 tablet (  500 mg total) by mouth daily with breakfast. NO MORE REFILLS WITHOUT OFFICE VISIT - 2ND NOTICE (Patient taking differently: Take 500 mg by mouth every evening. Dr. Marisue Humble fills this for pt) 15 tablet 0  . metoprolol tartrate (LOPRESSOR) 50 MG tablet Take 1 tablet (50 mg total) by mouth 2 (two) times daily.    Marland Kitchen oxybutynin (DITROPAN XL) 15 MG 24 hr tablet Take 15 mg by mouth at bedtime.    . potassium chloride SA (KLOR-CON) 20 MEQ tablet Take 20 mEq by mouth daily.    . Red Yeast Rice 600 MG CAPS Take 600 mg by mouth at bedtime.    Alveda Reasons 20 MG TABS tablet TAKE 1 TABLET (20 MG TOTAL) BY MOUTH DAILY WITH SUPPER. (Patient taking  differently: Take 20 mg by mouth at bedtime. ) 30 tablet 5  . Zinc 50 MG TABS Take 50 mg by mouth at bedtime.    Glory Rosebush VERIO test strip CHECK BS BID  7   Current Facility-Administered Medications  Medication Dose Route Frequency Provider Last Rate Last Admin  . 0.9 %  sodium chloride infusion  500 mL Intravenous Continuous Gatha Mayer, MD        Allergies  Allergen Reactions  . Simvastatin     Myalgias  ?? lipitor   . Statins Other (See Comments)    Social History   Socioeconomic History  . Marital status: Married    Spouse name: Cephas  . Number of children: 1  . Years of education: Not on file  . Highest education level: Not on file  Occupational History  . Occupation: Social worker - part-time  Tobacco Use  . Smoking status: Former Smoker    Packs/day: 0.50    Years: 25.00    Pack years: 12.50    Types: Cigarettes    Quit date: 02/13/1988    Years since quitting: 31.8  . Smokeless tobacco: Never Used  Substance and Sexual Activity  . Alcohol use: Yes    Comment: occas  . Drug use: No  . Sexual activity: Yes    Birth control/protection: None  Other Topics Concern  . Not on file  Social History Narrative  . Not on file   Social Determinants of Health   Financial Resource Strain:   . Difficulty of Paying Living Expenses: Not on file  Food Insecurity:   . Worried About Charity fundraiser in the Last Year: Not on file  . Ran Out of Food in the Last Year: Not on file  Transportation Needs:   . Lack of Transportation (Medical): Not on file  . Lack of Transportation (Non-Medical): Not on file  Physical Activity:   . Days of Exercise per Week: Not on file  . Minutes of Exercise per Session: Not on file  Stress:   . Feeling of Stress : Not on file  Social Connections:   . Frequency of Communication with Friends and Family: Not on file  . Frequency of Social Gatherings with Friends and Family: Not on file  . Attends Religious Services: Not on  file  . Active Member of Clubs or Organizations: Not on file  . Attends Archivist Meetings: Not on file  . Marital Status: Not on file  Intimate Partner Violence:   . Fear of Current or Ex-Partner: Not on file  . Emotionally Abused: Not on file  . Physically Abused: Not on file  . Sexually Abused: Not on file    Family History  Problem Relation Age of Onset  . Hypertension Mother   . Heart attack Mother   . Alcohol abuse Father   . Hypertension Brother   . Hyperlipidemia Brother   . Ovarian cancer Maternal Aunt 56  . Pancreatic cancer Maternal Grandfather 47  . Breast cancer Cousin 41       Mother's paternal first cousin, had breast cancer again at 80  . Ovarian cancer Cousin 11       "groin cancer"  . Hypertension Paternal Grandmother   . Diabetes Paternal Grandmother   . Liver cancer Paternal Aunt 59       ETOH related  . Colon cancer Neg Hx   . Stomach cancer Neg Hx   . Rectal cancer Neg Hx   . Esophageal cancer Neg Hx     ROS- All systems are reviewed and negative except as per the HPI above  Physical Exam: Vitals:   12/02/19 0839  BP: (!) 152/90  Pulse: 71  Weight: 99.2 kg  Height: '5\' 5"'  (1.651 m)   Wt Readings from Last 3 Encounters:  12/02/19 99.2 kg  08/19/19 96.2 kg  07/09/19 93.4 kg    Labs: Lab Results  Component Value Date   NA 140 07/09/2019   K 4.5 07/09/2019   CL 104 07/09/2019   CO2 28 06/24/2019   GLUCOSE 80 07/09/2019   BUN 18 07/09/2019   CREATININE 1.00 07/09/2019   CALCIUM 9.2 06/24/2019   Lab Results  Component Value Date   INR 1.4 (H) 07/08/2012   Lab Results  Component Value Date   CHOL 180 04/07/2013   HDL 62 04/07/2013   LDLCALC 82 04/07/2013   TRIG 179 (H) 04/07/2013     GEN- The patient is well appearing, alert and oriented x 3 today.   Head- normocephalic, atraumatic Eyes-  Sclera clear, conjunctiva pink Ears- hearing intact Oropharynx- clear Neck- supple, no JVP Lymph- no cervical  lymphadenopathy Lungs- Clear to ausculation bilaterally, normal work of breathing Heart- Regular rate and rhythm, no murmurs, rubs or gallops, PMI not laterally displaced GI- soft, NT, ND, + BS Extremities- no clubbing, cyanosis, or edema MS- no significant deformity or atrophy Skin- no rash or lesion Psych- euthymic mood, full affect Neuro- strength and sensation are intact  EKG-NSR, normal ekg, pr int 174 ms, qrs int 70 ms, qtc 428 ms  Echo- 04/2019-1. Left ventricular ejection fraction, by estimation, is 60 to 65%. The  left ventricle has normal function. The left ventricle has no regional  wall motion abnormalities. Left ventricular diastolic function could not  be evaluated.  2. Right ventricular systolic function is normal. The right ventricular  size is normal. There is moderately elevated pulmonary artery systolic  pressure. The estimated right ventricular systolic pressure is 36.6 mmHg.  3. Left atrial size was moderately dilated.  4. Right atrial size was mildly dilated.  5. The mitral valve is normal in structure. Mild mitral valve  regurgitation. No evidence of mitral stenosis.  6. Tricuspid valve regurgitation is mild to moderate.  7. The aortic valve is normal in structure. Aortic valve regurgitation is  not visualized. No aortic stenosis is present.  8. The inferior vena cava is dilated in size with >50% respiratory  variability, suggesting right atrial pressure of 8 mmHg.   Assessment and Plan: 1. Paroxysmal afib Doing well staying in SR since last cardioversion  Continue metoprolol 50 mg bid Weigh loss encouraged   2. CHA2DS2VASc score of 5 Continue  xarelto 20 mg  qd  3. HTN Elevated this am Has not taken BP pills this am   F/u in afib clinic 6 months   Butch Penny C. Shulamit Donofrio, [facility] [address] [place], [place] [address] [phone]

## 2019-12-28 ENCOUNTER — Other Ambulatory Visit: Payer: Self-pay | Admitting: Internal Medicine

## 2019-12-28 DIAGNOSIS — Z8249 Family history of ischemic heart disease and other diseases of the circulatory system: Secondary | ICD-10-CM

## 2019-12-28 DIAGNOSIS — I480 Paroxysmal atrial fibrillation: Secondary | ICD-10-CM

## 2019-12-28 NOTE — Telephone Encounter (Signed)
Prescription refill request for Xarelto received.   Last office visit: Jessica Mendoza 12/02/2019 Weight: 99.2 kg Age: 64 yo Scr: 1.0 07/10/2019 CrCl: 87 ml/min   Prescription refill sent.

## 2020-04-18 NOTE — Progress Notes (Signed)
Cardiology Office Note Date:  04/18/2020  Patient ID:  Jessica Mendoza, Trnka 11-Dec-1955, MRN [id number] PCP:  Gaynelle Arabian, MD  Electrophysiologist:  Dr. Caryl Comes     Chief Complaint:  6 mo  History of Present Illness: Jessica Mendoza is a 65 y.o. female with history of breast cancer (lumpectomy, radioactive seed placement, chemo), HTN, DM, hypothyroidism, OSA (she reports after losing about 30lbs, retested and negative), HLD, and AFib. Family hx of HCM (her echos have been w/o LVH)   She saw A. Larrie Kass, at that visit she was in rate controlled AF and unaware.  Feeling well, exercising regularly.  No plans for rhythm control given no symptoms  She last saw Dr. Caryl Comes March 2021, at that time described as symptomatic with exercise intolerance (though this comment is mentioned on all notes), planned to pursue rhythm control mentioning young age as part of decision as well, planned to pursue updated and DCCV.  06/24/2019 She continues to do well.  No notable symptoms with her AFib.   No CP cardiac awareness, palpitations.  She no near syncope or syncope.  She continues to exercise regularly via [employer] and [employer] program,  Ends up to be 5 days a week.  At peak exercise she is winded, but no exertional incapacities. She is very compliant with her her medicines, no missed doses of her Xarelto in the last 3 weeks if ever. No bleeding or signs of bleeding We discussed DCCV and rhythm control attempt she was agreeable, asked to try and identify, if able to restore SR if she felt improved/diffierent in any way.  DCCV 07/09/19 DCCV was succesful  I saw her 08/19/2019  She is doing very well.  She does feel like her exertional capacity is improved, feels stronger during her workouts and does not get as winded. She missed one dose of her xarelto 2/2 to some car trouble and unable to get back to town one evening.  She denies any symptoms, feels quite well.  She saw d. Kayleen Memos, NP Oct 2021 to  establish with the clinic, maintaining SR, no changes were made, encouraged weight loss, rec 6 mo visit  TODAY She continues to do very well. She does not think she has had any more AF She is at the gym 3d/week, active with good exertional capacity Participating in a wellness program through [employer] as well. No CP, SOB, DOE No dizzy spells, near syncope or syncope. She will occasionally have some bleeding when she briushes her teeth, her dentist told her was the crown, no bleeding or signs of bleeding otherwise   She had labs today with her PMD   AFib hx 2014  AAD Hx None to date   Past Medical History:  Diagnosis Date  . Afib ([facility])   . Arthritis    WRISTS AND NECK  . Benign positional vertigo   . Bloating   . Breast cancer ([place]) 2016   ER+/PR-/Her2-  . Colon polyps 12/2005  . Diabetes mellitus, type 2 ([place]) 06/2010  . Essential hypertension, benign   . Family history of hypertrophic cardiomyopathy   . Family history of ovarian cancer   . Family history of pancreatic cancer   . Focal nodular hyperplasia of liver    bx 2002  . Glaucoma   . Goiter 10/2006  . Hot flashes   . Hypothyroidism   . Migraines    menstrual  . Obesity   . OSA (obstructive sleep apnea)   . Other  and unspecified hyperlipidemia   . Sleep apnea     Past Surgical History:  Procedure Laterality Date  . BREAST LUMPECTOMY     right breast- benign  . BREAST LUMPECTOMY WITH RADIOACTIVE SEED AND SENTINEL LYMPH NODE BIOPSY Right 11/25/2014   Procedure: RIGHT BREAST LUMPECTOMY WITH RADIOACTIVE SEED AND RIGHT AXILLARY SENTINEL LYMPH NODE BIOPSY;  Surgeon: Excell Seltzer, MD;  Location: [place];  Service: General;  Laterality: Right;  . CARDIOVERSION N/A 07/11/2012   Procedure: CARDIOVERSION;  Surgeon: Larey Dresser, MD;  Location: [place];  Service: Cardiovascular;  Laterality: N/A;  . CARDIOVERSION N/A 07/09/2019   Procedure: CARDIOVERSION;  Surgeon: Lelon Perla,  MD;  Location[facility];  Service: Cardiovascular;  Laterality: N/A;  . LIVER BIOPSY  2002    Current Outpatient Medications  Medication Sig Dispense Refill  . Alpha Lipoic Acid 200 MG CAPS Take 200 mg by mouth daily before breakfast.    . amLODipine (NORVASC) 5 MG tablet TAKE 1 TABLET BY MOUTH  DAILY (Patient taking differently: Take 5 mg by mouth at bedtime. ) 90 tablet 3  . anastrozole (ARIMIDEX) 1 MG tablet Take 1 tablet (1 mg total) by mouth daily. (Patient taking differently: Take 1 mg by mouth at bedtime. ) 90 tablet 3  . Biotin 5000 MCG CAPS Take 5,000 mcg by mouth at bedtime.    . Cholecalciferol (VITAMIN D-3) 1000 UNITS CAPS Take 1,000 Units by mouth at bedtime.     . Coenzyme Q10 (CO Q 10) 100 MG CAPS Take 100 mg by mouth at bedtime.     . Collagen Hydrolysate POWD 1 scoop by Does not apply route daily. (Patient taking differently: Take 1 scoop by mouth 3 (three) times a week. In the morning with warm lemon water)  0  . levothyroxine (SYNTHROID, LEVOTHROID) 25 MCG tablet Take 25 mcg by mouth daily before breakfast. Dr Suzette Battiest sees pt for thyroid    . metFORMIN (GLUCOPHAGE-XR) 500 MG 24 hr tablet Take 1 tablet (500 mg total) by mouth daily with breakfast. NO MORE REFILLS WITHOUT OFFICE VISIT - 2ND NOTICE (Patient taking differently: Take 500 mg by mouth every evening. Dr. Marisue Humble fills this for pt) 15 tablet 0  . metoprolol tartrate (LOPRESSOR) 50 MG tablet Take 1 tablet (50 mg total) by mouth 2 (two) times daily.    Glory Rosebush VERIO test strip CHECK BS BID  7  . oxybutynin (DITROPAN XL) 15 MG 24 hr tablet Take 15 mg by mouth at bedtime.    . potassium chloride SA (KLOR-CON) 20 MEQ tablet Take 20 mEq by mouth daily.    . Red Yeast Rice 600 MG CAPS Take 600 mg by mouth at bedtime.    Alveda Reasons 20 MG TABS tablet TAKE 1 TABLET (20 MG TOTAL) BY MOUTH DAILY WITH SUPPER. 30 tablet 5  . Zinc 50 MG TABS Take 50 mg by mouth at bedtime.     Current Facility-Administered Medications   Medication Dose Route Frequency Provider Last Rate Last Admin  . 0.9 %  sodium chloride infusion  500 mL Intravenous Continuous Gatha Mayer, MD        Allergies:   Simvastatin and Statins   Social History:  The patient  reports that she quit smoking about 32 years ago. Her smoking use included cigarettes. She has a 12.50 pack-year smoking history. She has never used smokeless tobacco. She reports current alcohol use. She reports that she does not use drugs.   Family  History:  The patient's family history includes Alcohol abuse in her father; Breast cancer (age of onset: 19) in her cousin; Diabetes in her paternal grandmother; Heart attack in her mother; Hyperlipidemia in her brother; Hypertension in her brother, mother, and paternal grandmother; Liver cancer (age of onset: 71) in her paternal aunt; Ovarian cancer (age of onset: 55) in her maternal aunt; Ovarian cancer (age of onset: 41) in her cousin; Pancreatic cancer (age of onset: 65) in her maternal grandfather.  ROS:  Please see the history of present illness.    All other systems are reviewed and otherwise negative.   PHYSICAL EXAM:  VS:  There were no vitals taken for this visit. BMI: There is no height or weight on file to calculate BMI. Well nourished, well developed, in no acute distress  HEENT: normocephalic, atraumatic  Neck: no JVD, carotid bruits or masses Cardiac:  RRR ; no significant murmurs, no rubs, or gallops Lungs:  CTA b/l, no wheezing, rhonchi or rales  Abd: soft, nontender, obese MS: no deformity or atrophy Ext: trace edema b/l Skin: warm and dry, no rash Neuro:  No gross deficits appreciated Psych: euthymic mood, full affect   EKG:  Not done today   05/13/2019; TTE IMPRESSIONS  1. Left ventricular ejection fraction, by estimation, is 60 to 65%. The  left ventricle has normal function. The left ventricle has no regional  wall motion abnormalities. Left ventricular diastolic function could not  be  evaluated.  2. Right ventricular systolic function is normal. The right ventricular  size is normal. There is moderately elevated pulmonary artery systolic  pressure. The estimated right ventricular systolic pressure is 85.0 mmHg.  3. Left atrial size was moderately dilated.  4. Right atrial size was mildly dilated.  5. The mitral valve is normal in structure. Mild mitral valve  regurgitation. No evidence of mitral stenosis.  6. Tricuspid valve regurgitation is mild to moderate.  7. The aortic valve is normal in structure. Aortic valve regurgitation is  not visualized. No aortic stenosis is present.  8. The inferior vena cava is dilated in size with >50% respiratory  variability, suggesting right atrial pressure of 8 mmHg.   Recent Labs: 06/24/2019: Platelets 237 07/09/2019: BUN 18; Creatinine, Ser 1.00; Hemoglobin 13.9; Potassium 4.5; Sodium 140  No results found for requested labs within last 8760 hours.   CrCl cannot be calculated (Patient's most recent lab result is older than the maximum 21 days allowed.).   Wt Readings from Last 3 Encounters:  12/02/19 218 lb 12.8 oz (99.2 kg)  08/19/19 212 lb (96.2 kg)  07/09/19 206 lb (93.4 kg)     Other studies reviewed: Additional studies/records reviewed today include: summarized above  ASSESSMENT AND PLAN:  1. Persistent AFib     CHA2DS2Vasc is 4, on Xarelto,  appropriately dosed      Maintaining SR  2. HTN     Looks good   Disposition:  She sees her PMD every 41mo we can see her back in a year, sooner if needed.    Current medicines are reviewed at length with the patient today.  The patient did not have any concerns regarding medicines.  SVenetia Night PA-C 04/18/2020 5:32 PM     [place] [phone](office)  [phone](fax)

## 2020-04-20 ENCOUNTER — Other Ambulatory Visit: Payer: Self-pay

## 2020-04-20 ENCOUNTER — Ambulatory Visit (INDEPENDENT_AMBULATORY_CARE_PROVIDER_SITE_OTHER): Payer: Medicare Other | Admitting: Physician Assistant

## 2020-04-20 ENCOUNTER — Encounter: Payer: Self-pay | Admitting: Physician Assistant

## 2020-04-20 VITALS — BP 110/66 | HR 82 | Ht 65.0 in | Wt 218.8 lb

## 2020-04-20 DIAGNOSIS — I10 Essential (primary) hypertension: Secondary | ICD-10-CM | POA: Diagnosis not present

## 2020-04-20 DIAGNOSIS — I4819 Other persistent atrial fibrillation: Secondary | ICD-10-CM | POA: Diagnosis not present

## 2020-04-20 NOTE — Patient Instructions (Signed)
Medication Instructions:   Your physician recommends that you continue on your current medications as directed. Please refer to the Current Medication list given to you today.  *If you need a refill on your cardiac medications before your next appointment, please call your pharmacy*   Lab Work: NONE ORDERED  TODAY   If you have labs (blood work) drawn today and your tests are completely normal, you will receive your results only by: . MyChart Message (if you have MyChart) OR . A paper copy in the mail If you have any lab test that is abnormal or we need to change your treatment, we will call you to review the results.   Testing/Procedures: NONE ORDERED  TODAY    Follow-Up: At [facility], you and your health needs are our priority.  As part of our continuing mission to provide you with exceptional heart care, we have created designated Provider Care Teams.  These Care Teams include your primary Cardiologist (physician) and Advanced Practice Providers (APPs -  Physician Assistants and Nurse Practitioners) who all work together to provide you with the care you need, when you need it.  We recommend signing up for the patient portal called "MyChart".  Sign up information is provided on this After Visit Summary.  MyChart is used to connect with patients for Virtual Visits (Telemedicine).  Patients are able to view lab/test results, encounter notes, upcoming appointments, etc.  Non-urgent messages can be sent to your provider as well.   To learn more about what you can do with MyChart, go to [email or web address].    Your next appointment:   1 year(s)  The format for your next appointment:   In Person  Provider:   Renee Ursuy, PA-C   Other Instructions   

## 2020-05-16 ENCOUNTER — Other Ambulatory Visit: Payer: Self-pay | Admitting: *Deleted

## 2020-05-16 DIAGNOSIS — Z8249 Family history of ischemic heart disease and other diseases of the circulatory system: Secondary | ICD-10-CM

## 2020-05-16 DIAGNOSIS — I480 Paroxysmal atrial fibrillation: Secondary | ICD-10-CM

## 2020-05-16 MED ORDER — RIVAROXABAN 20 MG PO TABS: 20.0000 mg | ORAL_TABLET | Freq: Every day | ORAL | 2 refills | Status: DC

## 2020-05-16 NOTE — Telephone Encounter (Signed)
Xarelto 20mg  paper refill request received. Pt is 65 years old, weight-99.2kg, Crea-0.81 via [place] on 3/9/202 at [facility], last seen by Tommye Standard on 04/20/2020, Diagnosis-Afib, CrCl-108.44ml/min; Dose is appropriate based on dosing criteria. Will send in refill to requested pharmacy.

## 2020-06-01 IMAGING — US US THYROID
1 series · 13 of 25 positions shown · non-contrast
Comparison: Most recent prior thyroid ultrasound 11/09/2015; most
remote thyroid ultrasound 10/19/2011

CLINICAL DATA: Goiter. 61-year-old female with a multinodular
thyroid goiter. Patient is previously undergone biopsy of right,
isthmic and left thyroid nodules on 03/11/2014.

EXAM:
THYROID ULTRASOUND
TECHNIQUE: Ultrasound examination of the thyroid gland and adjacent soft
tissues was performed.

[Series 1: us thyroid · 0.08mm/px · 13 of 64 slices shown]
[im 1/64]
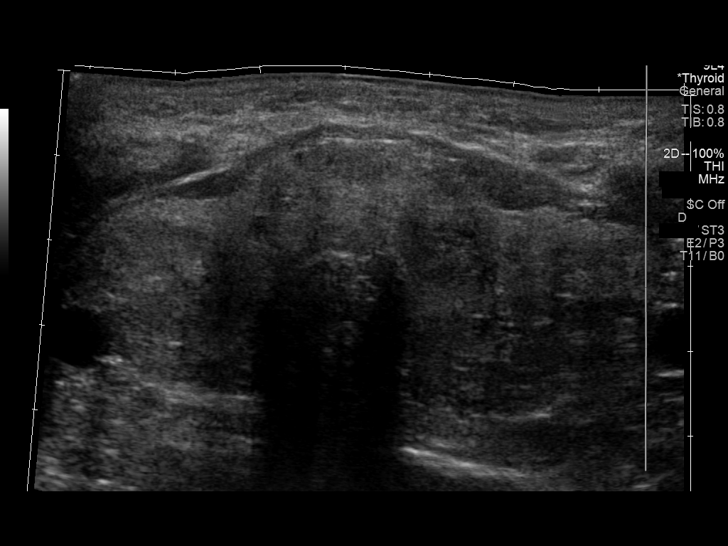
[im 6/64]
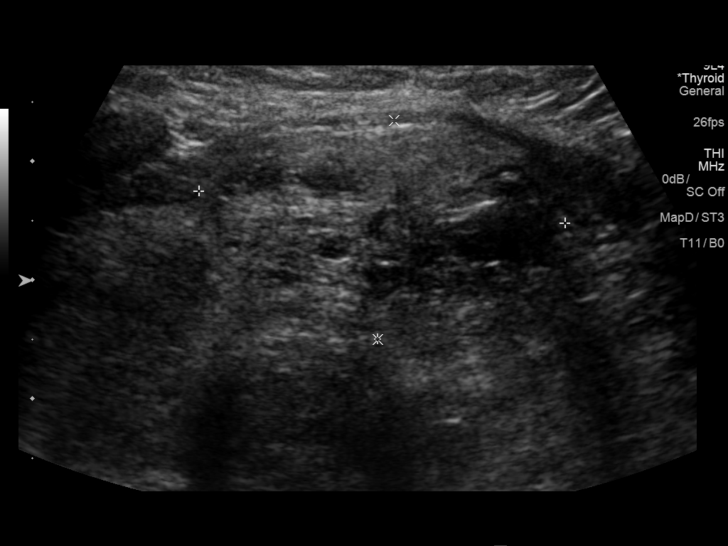
[im 11/64]
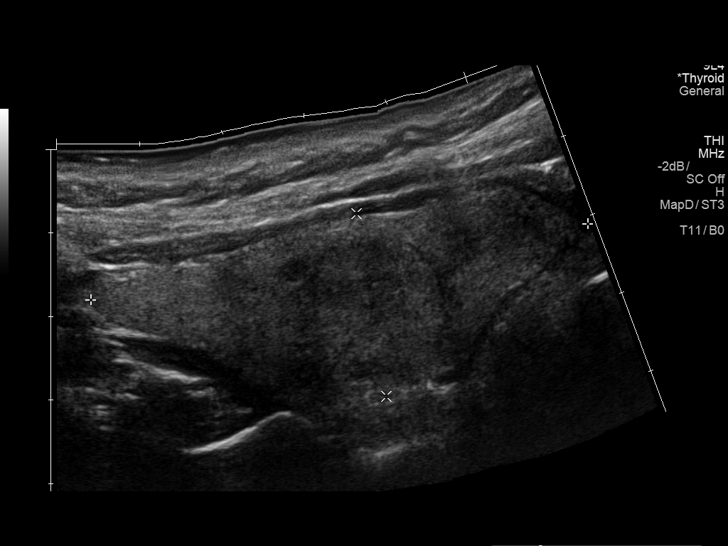
[im 16/64]
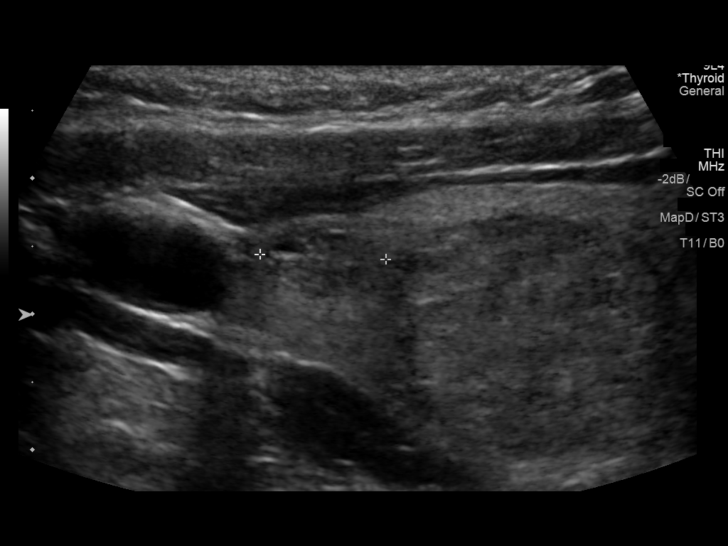
[im 22/64]
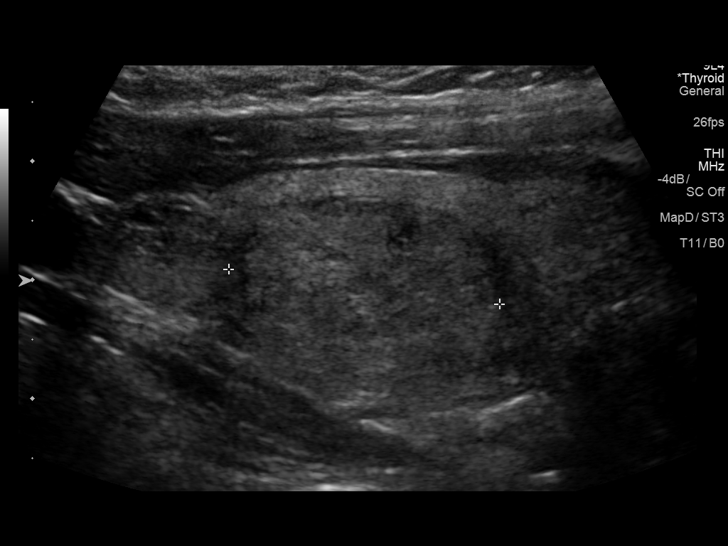
[im 27/64]
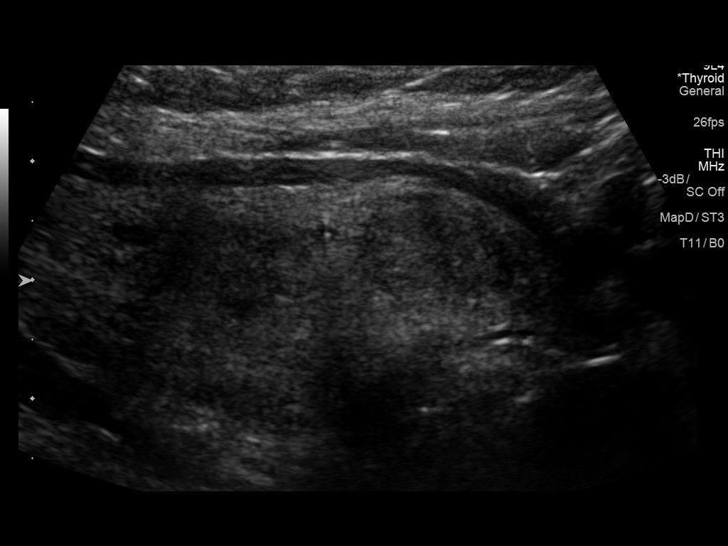
[im 32/64]
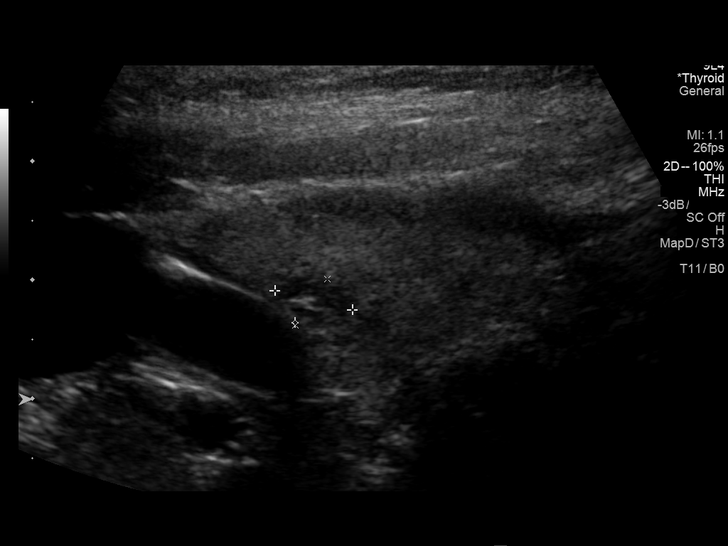
[im 37/64]
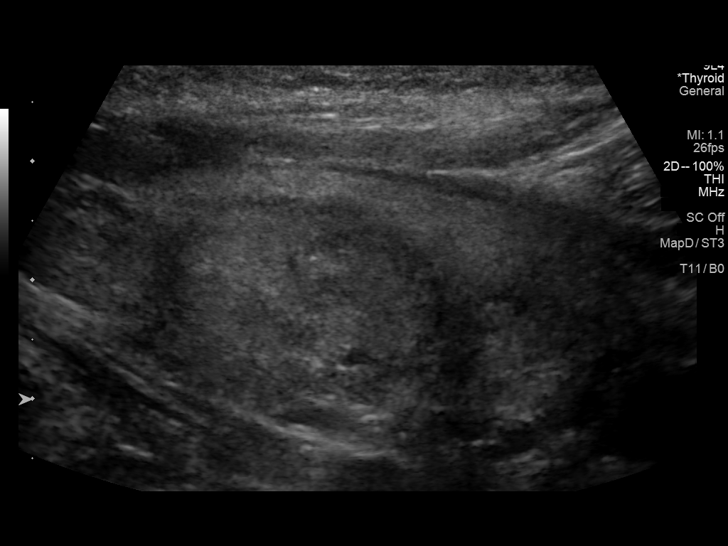
[im 43/64]
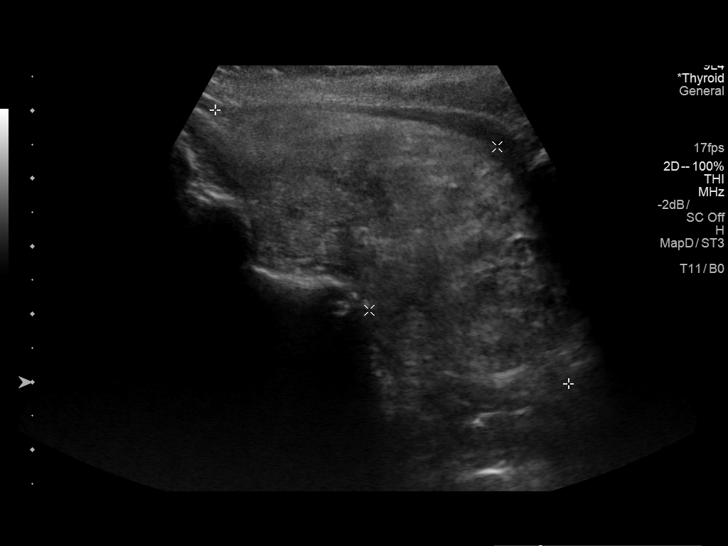
[im 48/64]
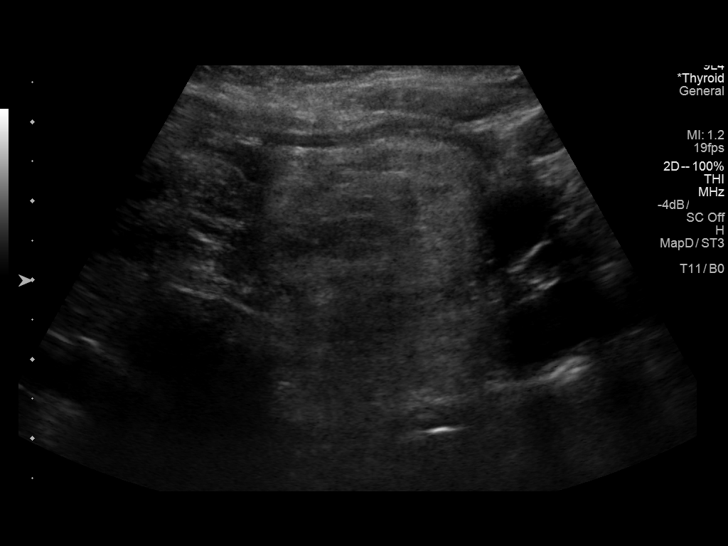
[im 53/64]
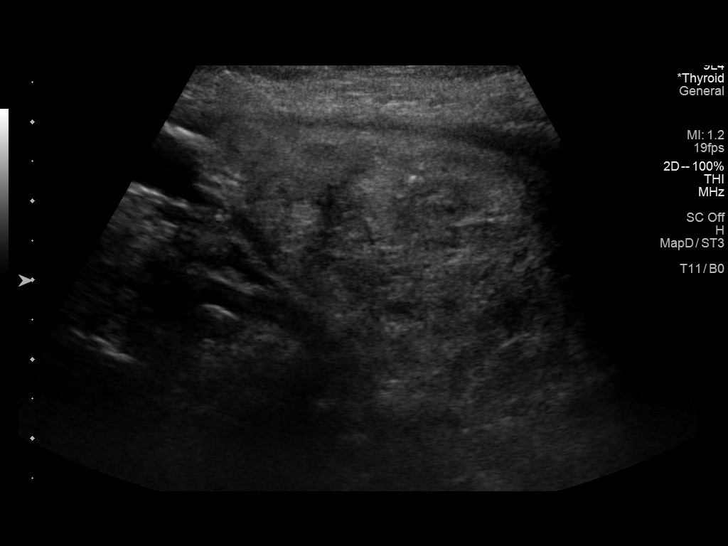
[im 58/64]
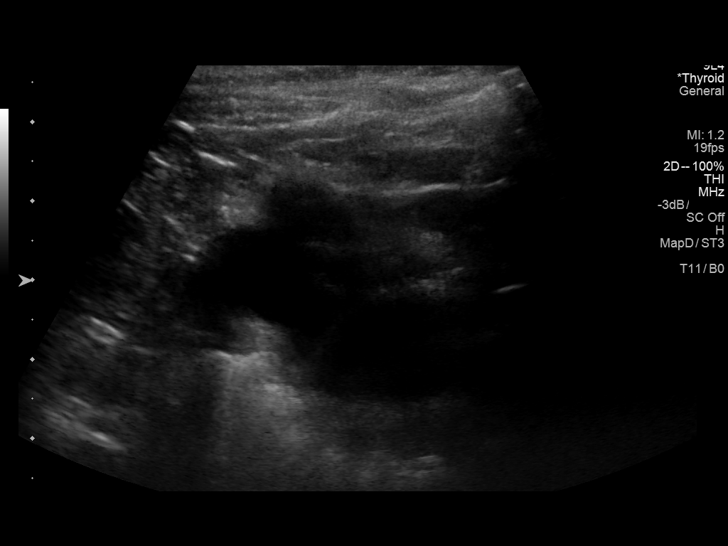
[im 64/64]
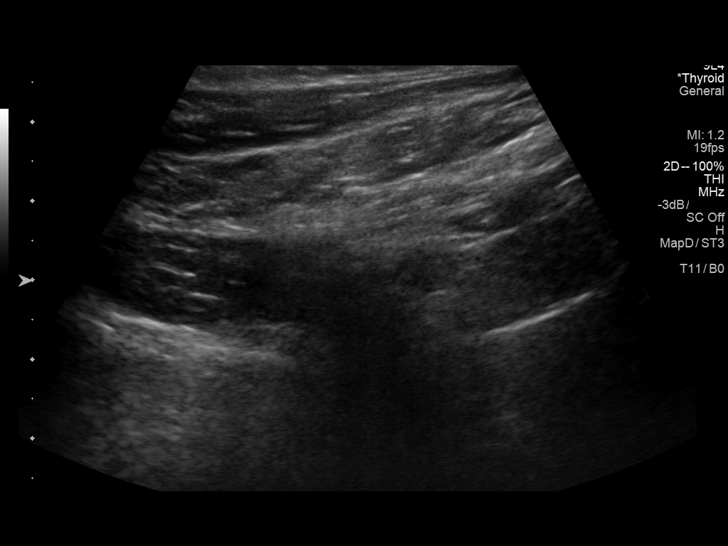

[13 of 25 positions shown; findings below may reference images not displayed]

FINDINGS: Parenchymal Echotexture: Markedly heterogenous

Isthmus: 1.3 cm

Right lobe: 6.0 x 2.2 x 2.6 cm

Left lobe: 6.6 x 3.1 x 3.3 cm

_________________________________________________________

Estimated total number of nodules >/= 1 cm: 4

Number of spongiform nodules >/=  2 cm not described below (TR1): 0

Number of mixed cystic and solid nodules >/= 1.5 cm not described
below (TR2): 0

_________________________________________________________

Nodule # 4:

Prior biopsy: No

Location: Right; Mid

Maximum size: 1.3 cm; Other 2 dimensions: 1.2 x 0.7 cm, previously,
1.3 x 1.3 x 0.8 cm on 11/09/2015 and 1.1 x 1.1 x 0.7 cm on
10/19/2011

Composition: solid/almost completely solid (2)

Echogenicity: hypoechoic (2)

Shape: not taller-than-wide (0)

Margins: smooth (0)

Echogenic foci: none (0)

ACR TI-RADS total points: 4.

ACR TI-RADS risk category:  TR4 (4-6 points).

Significant change in size (>/= 20% in two dimensions and minimal
increase of 2 mm): No

Change in features: No

Change in ACR TI-RADS risk category: No

ACR TI-RADS recommendations:

Greater than 5 year stability of this TI-RADS category 4 nodule
consistent with benignity.

_________________________________________________________

Nodule # 3: The previously biopsied nodule in the right inferior
thyroid gland measures 2.3 x 2.0 x 1.7 cm, unchanged compared to
x 1.3 by 1.5 cm in 4452.

Nodule # 1: The previously biopsied nodule in the thyroid isthmus
measures 3.1 x 2.9 x 1.9 cm, unchanged compared to 3.1 x 2.8 x
cm on 11/09/2015.

Nodule # 6: The previously biopsied nodule occupying the majority of
the left inferior gland is also essentially unchanged at 5.3 x 3.1 x
3.1 cm.

Additional subcentimeter thyroid nodules (labeled # 2 and # 5) are
noted incidentally in do not meet criteria for further evaluation.
IMPRESSION: 1. Diffusely heterogeneous, enlarged multinodular thyroid gland most
consistent with multinodular goiter.
2. No significant interval change in the 3 previously biopsied
thyroid nodules (labeled # 1, # 3 and # 6).
3. Greater than 5 year stability of a 1.3 cm TI-RADS category 4
nodule (labeled # 4) in the medial aspect of the right mid gland
dating back to 10/19/2011 consistent with benignity. No further
follow-up required.

The above is in keeping with the ACR TI-RADS recommendations - [HOSPITAL] 7838;[DATE].

## 2020-06-03 ENCOUNTER — Other Ambulatory Visit: Payer: Self-pay | Admitting: Hematology and Oncology

## 2020-06-21 NOTE — Assessment & Plan Note (Signed)
Rt Lumpectomy 11/25/14: IDC 1.7 cm Grade 2 with DCIS , 0/2 LN, ER 100, PR 0%, Her 2 Neg T1CN0 (Stage 1A) Oncotype DX 18, 11% risk of recurrence Adjuvant radiation therapy 12/23/2014 to 01/28/2015  Current treatment: Adjuvant antiestrogen therapy with anastrozole 1 mg daily 5 years started 03/02/2015  Anastrozole toxicities:Denies any hot flashes or arthralgias or myalgias.  Survivorship: Encouraged her to continue to exercise and stay active.  Surveillance for breast cancer:  1. Breast exam5/12/2020: Benign scar tissue 2. mammograms at [facility]: 11/04/2019: Benign breast density category B 3.Bone density 10/29/2018: T score 0.2: Normal   Return to clinic in1 yearfor follow-up

## 2020-06-21 NOTE — Progress Notes (Signed)
Patient Care Team: Gaynelle Arabian, MD as PCP - General (Family Medicine) Jacelyn Pi, MD (Endocrinology) Inda Castle, MD (Inactive) (Gastroenterology) Vania Rea, MD (Obstetrics and Gynecology) Excell Seltzer, MD (Inactive) as Consulting Physician (General Surgery) Nicholas Lose, MD as Consulting Physician (Hematology and Oncology) Eppie Gibson, MD as Attending Physician (Radiation Oncology) Mauro Kaufmann, RN as Registered Nurse Rockwell Germany, RN as Registered Nurse Holley Bouche, NP (Inactive) as Nurse Practitioner (Nurse Practitioner) Sylvan Cheese, NP as Nurse Practitioner (Nurse Practitioner)  DIAGNOSIS:    ICD-10-CM   1. Post-menopausal  Z78.0 DG Bone Density  2. Malignant neoplasm of upper-outer quadrant of right breast in female, estrogen receptor positive ([place])  C50.411    Z17.0     SUMMARY OF ONCOLOGIC HISTORY: Oncology History  Breast cancer of upper-outer quadrant of right female breast ([place])  09/08/2014 Mammogram   Right breast: irregular mass with calcifications at 11:00, posterior depth.   09/15/2014 Breast US   Right breast: 1.6 cm irregular mass with indistinct margin at 11:00, posterior depth, 6 CFN. Related microcalcifications. Hypoechoic, correlates with mammographic findings.   09/16/2014 Initial Biopsy   Right breast biopsy: Invasive ductal carcinoma, grade 2, ER+ 100%, PR- 0%, HER-2 negative, Ki-67 10%   09/16/2014 Clinical Stage   Stage IA: T1c N0   09/23/2014 Procedure   Breast/Ovarian panel ([facility]) reveals no clinically signficant variant at ATM, BARD1, BRCA1, BRCA2, BRIP1, CDH1, CHEK2, EPCAM, FANCC, MLH1, MSH2, MSH6, NBN, PALB2, PMS2, PTEN, RAD51C, RAD51D, TP53, and XRCC2.   11/25/2014 Definitive Surgery   Right lumpectomy: IDC 1.7 cm Grade 2 with DCIS, 0/2 LN, ER+ 100, PR- 0%, HER 2 Neg    11/25/2014 Pathologic Stage   Stage IA: T1c N0   11/25/2014 Oncotype testing   Oncotype DX 18, 11% ROR   12/23/2014 -  01/28/2015 Radiation Therapy   Adjuvant RT: Right breast / 50 Gy in 25 fractions   03/02/2015 -  Anti-estrogen oral therapy   Anastrozole 1 mg daily 5 years   03/31/2015 Survivorship   Survivorship visit completed and copy of care plan given to patient     CHIEF COMPLIANT: Follow-up of right breast cancer on anastrozole therapy  INTERVAL HISTORY: Jessica Mendoza is a 65 y.o. with above-mentioned history of right breast cancer treated with lumpectomy, radiation, and who is currently on antiestrogen therapy with anastrozole.Mammogram on 11/04/19 showed no evidence of malignancy bilaterally. She presents to the clinic today for annual follow-up.   She reports no new problems or concerns.  She is tolerating anastrozole extremely well.  ALLERGIES:  is allergic to simvastatin and statins.  MEDICATIONS:  Current Outpatient Medications  Medication Sig Dispense Refill  . Alpha Lipoic Acid 200 MG CAPS Take 200 mg by mouth daily before breakfast.    . amLODipine (NORVASC) 5 MG tablet TAKE 1 TABLET BY MOUTH  DAILY 90 tablet 3  . anastrozole (ARIMIDEX) 1 MG tablet TAKE 1 TABLET BY MOUTH  DAILY 90 tablet 3  . Biotin 5000 MCG CAPS Take 5,000 mcg by mouth at bedtime.    . Cholecalciferol (VITAMIN D-3) 1000 UNITS CAPS Take 1,000 Units by mouth at bedtime.     . Coenzyme Q10 (CO Q 10) 100 MG CAPS Take 100 mg by mouth at bedtime.     . Collagen Hydrolysate POWD 1 scoop by Does not apply route daily.  0  . levothyroxine (SYNTHROID, LEVOTHROID) 25 MCG tablet Take 25 mcg by mouth daily before breakfast. Dr Suzette Battiest sees pt for  thyroid    . metFORMIN (GLUCOPHAGE-XR) 500 MG 24 hr tablet Take 1 tablet (500 mg total) by mouth daily with breakfast. NO MORE REFILLS WITHOUT OFFICE VISIT - 2ND NOTICE 15 tablet 0  . metoprolol tartrate (LOPRESSOR) 50 MG tablet Take 50 mg by mouth daily.    Glory Rosebush VERIO test strip CHECK BS BID  7  . oxybutynin (DITROPAN XL) 15 MG 24 hr tablet Take 15 mg by mouth at bedtime.    .  potassium chloride SA (KLOR-CON) 20 MEQ tablet Take 20 mEq by mouth daily.    . Red Yeast Rice 600 MG CAPS Take 600 mg by mouth at bedtime.    . rivaroxaban (XARELTO) 20 MG TABS tablet Take 1 tablet (20 mg total) by mouth daily with supper. 90 tablet 2  . Zinc 50 MG TABS Take 50 mg by mouth at bedtime.     Current Facility-Administered Medications  Medication Dose Route Frequency Provider Last Rate Last Admin  . 0.9 %  sodium chloride infusion  500 mL Intravenous Continuous Gatha Mayer, MD        PHYSICAL EXAMINATION: ECOG PERFORMANCE STATUS: 1 - Symptomatic but completely ambulatory  Vitals:   06/22/20 0843  BP: (!) 140/58  Pulse: 73  Resp: 18  Temp: (!) 97.5 F (36.4 C)  SpO2: 95%   Filed Weights   06/22/20 0843  Weight: 220 lb 3.2 oz (99.9 kg)    BREAST: No palpable masses or nodules in either right or left breasts. No palpable axillary supraclavicular or infraclavicular adenopathy no breast tenderness or nipple discharge. (exam performed in the presence of a chaperone)  LABORATORY DATA:  I have reviewed the data as listed CMP Latest Ref Rng & Units 07/09/2019 06/24/2019 05/13/2019  Glucose 70 - 99 mg/dL 80 77 91  BUN 8 - 23 mg/dL '18 12 13  ' Creatinine 0.44 - 1.00 mg/dL 1.00 0.87 0.95  Sodium 135 - 145 mmol/L 140 143 140  Potassium 3.5 - 5.1 mmol/L 4.5 4.0 3.9  Chloride 98 - 111 mmol/L 104 104 102  CO2 20 - 29 mmol/L - 28 24  Calcium 8.7 - 10.3 mg/dL - 9.2 9.1  Total Protein 6.4 - 8.3 g/dL - - -  Total Bilirubin 0.20 - 1.20 mg/dL - - -  Alkaline Phos 40 - 150 U/L - - -  AST 5 - 34 U/L - - -  ALT 0 - 55 U/L - - -    Lab Results  Component Value Date   WBC 5.3 06/24/2019   HGB 13.9 07/09/2019   HCT 41.0 07/09/2019   MCV 84 06/24/2019   PLT 237 06/24/2019   NEUTROABS 2.2 02/13/2017    ASSESSMENT & PLAN:  Breast cancer of upper-outer quadrant of right female breast ([place]) Rt Lumpectomy 11/25/14: IDC 1.7 cm Grade 2 with DCIS , 0/2 LN, ER 100, PR 0%, Her 2 Neg  T1CN0 (Stage 1A) Oncotype DX 18, 11% risk of recurrence Adjuvant radiation therapy 12/23/2014 to 01/28/2015  Current treatment: Adjuvant antiestrogen therapy with anastrozole 1 mg daily 5 years started 03/02/2015  Anastrozole toxicities:Denies any hot flashes or arthralgias or myalgias.  Survivorship: Encouraged her to continue to exercise and stay active.  She has gained few pounds this past year and I have encouraged her to exercise more regularly.  Surveillance for breast cancer:  1. Breast exam5/12/2020: Benign scar tissue 2. mammograms at [facility]: 11/04/2019: Benign breast density category B 3.Bone density 10/29/2018: T score 0.2: Normal We will order  another bone density test to be done in September this year.  Return to clinic in1 yearfor follow-up    Orders Placed This Encounter  Procedures  . DG Bone Density    Standing Status:   Future    Standing Expiration Date:   06/22/2021    Order Specific Question:   Reason for Exam (SYMPTOM  OR DIAGNOSIS REQUIRED)    Answer:   Post menopausal    Order Specific Question:   Preferred imaging location?    Answer:   [place]    Order Specific Question:   Release to patient    Answer:   Immediate   The patient has a good understanding of the overall plan. she agrees with it. she will call with any problems that may develop before the next visit here.  Total time spent: 20 mins including face to face time and time spent for planning, charting and coordination of care  Rulon Eisenmenger, MD, MPH 06/22/2020  I, Molly Dorshimer, am acting as scribe for Dr. Nicholas Lose.  I have reviewed the above documentation for accuracy and completeness, and I agree with the above.

## 2020-06-22 ENCOUNTER — Ambulatory Visit: Payer: 59 | Admitting: Hematology and Oncology

## 2020-06-22 ENCOUNTER — Other Ambulatory Visit: Payer: Self-pay

## 2020-06-22 ENCOUNTER — Inpatient Hospital Stay: Payer: Medicare Other | Attending: Hematology and Oncology | Admitting: Hematology and Oncology

## 2020-06-22 ENCOUNTER — Telehealth: Payer: Self-pay | Admitting: Hematology and Oncology

## 2020-06-22 VITALS — BP 140/58 | HR 73 | Temp 97.5°F | Resp 18 | Ht 65.0 in | Wt 220.2 lb

## 2020-06-22 DIAGNOSIS — C50411 Malignant neoplasm of upper-outer quadrant of right female breast: Secondary | ICD-10-CM | POA: Diagnosis not present

## 2020-06-22 DIAGNOSIS — Z170 Estrogen receptor positive status [ER+]: Secondary | ICD-10-CM

## 2020-06-22 DIAGNOSIS — Z79899 Other long term (current) drug therapy: Secondary | ICD-10-CM | POA: Diagnosis not present

## 2020-06-22 DIAGNOSIS — Z780 Asymptomatic menopausal state: Secondary | ICD-10-CM

## 2020-06-22 NOTE — Telephone Encounter (Signed)
Scheduled per los. Confirmed appt. Declined printout  

## 2020-07-14 ENCOUNTER — Other Ambulatory Visit: Payer: Self-pay | Admitting: *Deleted

## 2020-07-14 DIAGNOSIS — Z780 Asymptomatic menopausal state: Secondary | ICD-10-CM

## 2020-07-14 NOTE — Progress Notes (Signed)
Pt called stating bone density has been ordered for the wrong location.  Pt requesting to receive bone density at [facility] and not GI.  MD notified, verbal orders received and RN successfully faxed orders to [facility].

## 2020-11-09 ENCOUNTER — Telehealth: Payer: Self-pay | Admitting: *Deleted

## 2020-11-09 ENCOUNTER — Encounter: Payer: Self-pay | Admitting: Hematology and Oncology

## 2020-11-09 NOTE — Telephone Encounter (Signed)
Per MD request RN attempt x1 to contact pt regarding normal bone density report showing T score of 0.5.  No answer, LVM for pt to return call to the office.

## 2021-01-22 ENCOUNTER — Other Ambulatory Visit: Payer: Self-pay | Admitting: Internal Medicine

## 2021-01-22 DIAGNOSIS — I480 Paroxysmal atrial fibrillation: Secondary | ICD-10-CM

## 2021-01-22 DIAGNOSIS — Z8249 Family history of ischemic heart disease and other diseases of the circulatory system: Secondary | ICD-10-CM

## 2021-01-23 NOTE — Telephone Encounter (Signed)
Xarelto 20mg  refill request received. Pt is 66 years old, weight-99.9kg, Crea-0.88 on 10/19/2020 via [facility], last seen by Tommye Standard on 04/20/2020, Diagnosis-Afib, CrCl-99.23ml/min; Dose is appropriate based on dosing criteria. Will send in refill to requested pharmacy.

## 2021-02-17 ENCOUNTER — Other Ambulatory Visit: Payer: Self-pay

## 2021-02-17 ENCOUNTER — Telehealth (INDEPENDENT_AMBULATORY_CARE_PROVIDER_SITE_OTHER): Payer: Medicare Other | Admitting: Internal Medicine

## 2021-02-17 ENCOUNTER — Encounter: Payer: Self-pay | Admitting: Internal Medicine

## 2021-02-17 VITALS — BP 121/74 | HR 68 | Wt 215.0 lb

## 2021-02-17 DIAGNOSIS — I480 Paroxysmal atrial fibrillation: Secondary | ICD-10-CM

## 2021-02-17 DIAGNOSIS — E785 Hyperlipidemia, unspecified: Secondary | ICD-10-CM | POA: Diagnosis not present

## 2021-02-17 DIAGNOSIS — I4891 Unspecified atrial fibrillation: Secondary | ICD-10-CM | POA: Diagnosis not present

## 2021-02-17 MED ORDER — ZYPITAMAG 2 MG PO TABS: 1.0000 | ORAL_TABLET | Freq: Every day | ORAL | 11 refills | Status: DC

## 2021-02-17 NOTE — Patient Instructions (Addendum)
Medication Instructions:  START Pitavastatin Magnesium (ZYPITAMAG) 2 MG -- prescription has been sent to [facility]  *If you need a refill on your cardiac medications before your next appointment, please call your pharmacy*   Lab Work: FASTING lipid panel I 3-4 months  -- complete about 1 week before your next appointment with Dr. Debara Pickett   If you have labs (blood work) drawn today and your tests are completely normal, you will receive your results only by: [place] (if you have MyChart) OR A paper copy in the mail If you have any lab test that is abnormal or we need to change your treatment, we will call you to review the results.   Testing/Procedures: NONE   Follow-Up: At [facility], you and your health needs are our priority.  As part of our continuing mission to provide you with exceptional heart care, we have created designated Provider Care Teams.  These Care Teams include your primary Cardiologist (physician) and Advanced Practice Providers (APPs -  Physician Assistants and Nurse Practitioners) who all work together to provide you with the care you need, when you need it.  We recommend signing up for the patient portal called "MyChart".  Sign up information is provided on this After Visit Summary.  MyChart is used to connect with patients for Virtual Visits (Telemedicine).  Patients are able to view lab/test results, encounter notes, upcoming appointments, etc.  Non-urgent messages can be sent to your provider as well.   To learn more about what you can do with MyChart, go to [email or web address].    Your next appointment:   3-4 months with Dr. Debara Pickett -- lipid clinic

## 2021-02-17 NOTE — Progress Notes (Signed)
Virtual Visit via Video Note   This visit type was conducted due to national recommendations for restrictions regarding the COVID-19 Pandemic (e.g. social distancing) in an effort to limit this patient's exposure and mitigate transmission in our community.  Due to her co-morbid illnesses, this patient is at least at moderate risk for complications without adequate follow up.  This format is felt to be most appropriate for this patient at this time.  All issues noted in this document were discussed and addressed.  A limited physical exam was performed with this format.  Please refer to the patient's chart for her consent to telehealth for [facility].      Date:  02/17/2021   ID:  Jessica Mendoza, DOB 07-Apr-1955, MRN [id number] The patient was identified using 2 identifiers.  Evaluation Performed:  New Patient Evaluation  Patient Location:  [place] [address]  Provider location:   [address], [place], [place] [address]  PCP:  Gaynelle Arabian, MD  Cardiologist:  None Electrophysiologist:  None   Chief Complaint: Manage dyslipidemia  History of Present Illness:    Jessica Mendoza is a 66 y.o. female who presents via audio/video conferencing for a telehealth visit today.  This is a 66 year old female kindly referred by Dr. Marisue Humble for evaluation management of dyslipidemia.  Past medical history significant for breast cancer, type 2 diabetes, struct of sleep apnea and paroxysmal atrial fibrillation managed by Dr. Caryl Comes.  No known history of coronary disease.  Unfortunately she has been statin intolerant.  Most recently her lipids showed total cholesterol 251 and LDL 164 only on red yeast rice.  She has been statin intolerant in the past having had myalgias.  She was able to tolerate Livalo, but her insurance stopped paying for the medication.  She thinks she was taking 2 mg either daily or every other day.  The patient does not have symptoms concerning  for COVID-19 infection (fever, chills, cough, or new SHORTNESS OF BREATH).    Prior CV studies:   The following studies were reviewed today:  Chart reviewed, lab work  PMHx:  Past Medical History:  Diagnosis Date   Afib ([place])    Arthritis    WRISTS AND NECK   Benign positional vertigo    Bloating    Breast cancer ([place]) 2016   ER+/PR-/Her2-   Colon polyps 12/2005   Diabetes mellitus, type 2 ([place]) 06/2010   Essential hypertension, benign    Family history of hypertrophic cardiomyopathy    Family history of ovarian cancer    Family history of pancreatic cancer    Focal nodular hyperplasia of liver    bx 2002   Glaucoma    Goiter 10/2006   Hot flashes    Hypothyroidism    Migraines    menstrual   Obesity    OSA (obstructive sleep apnea)    Other and unspecified hyperlipidemia    Sleep apnea     Past Surgical History:  Procedure Laterality Date   BREAST LUMPECTOMY     right breast- benign   BREAST LUMPECTOMY WITH RADIOACTIVE SEED AND SENTINEL LYMPH NODE BIOPSY Right 11/25/2014   Procedure: RIGHT BREAST LUMPECTOMY WITH RADIOACTIVE SEED AND RIGHT AXILLARY SENTINEL LYMPH NODE BIOPSY;  Surgeon: Excell Seltzer, MD;  Location: [facility];  Service: General;  Laterality: Right;   CARDIOVERSION N/A 07/11/2012   Procedure: CARDIOVERSION;  Surgeon: Larey Dresser, MD;  Location: [facility];  Service: Cardiovascular;  Laterality: N/A;  CARDIOVERSION N/A 07/09/2019   Procedure: CARDIOVERSION;  Surgeon: Lelon Perla, MD;  Location: [facility] ENDOSCOPY;  Service: Cardiovascular;  Laterality: N/A;   LIVER BIOPSY  2002    FAMHx:  Family History  Problem Relation Age of Onset   Hypertension Mother    Heart attack Mother    Alcohol abuse Father    Hypertension Brother    Hyperlipidemia Brother    Ovarian cancer Maternal Aunt 56   Pancreatic cancer Maternal Grandfather 54   Breast cancer Cousin 61       Mother's paternal first cousin, had breast cancer again at  51   Ovarian cancer Cousin 67       "groin cancer"   Hypertension Paternal Grandmother    Diabetes Paternal Grandmother    Liver cancer Paternal Aunt 82       ETOH related   Colon cancer Neg Hx    Stomach cancer Neg Hx    Rectal cancer Neg Hx    Esophageal cancer Neg Hx     SOCHx:   reports that she quit smoking about 33 years ago. Her smoking use included cigarettes. She has a 12.50 pack-year smoking history. She has never used smokeless tobacco. She reports current alcohol use. She reports that she does not use drugs.  ALLERGIES:  Allergies  Allergen Reactions   Fenofibrate Other (See Comments)    myalgias   Simvastatin     Myalgias  ?? lipitor    Statins Other (See Comments)    MEDS:  Current Meds  Medication Sig   Alpha Lipoic Acid 200 MG CAPS Take 200 mg by mouth daily before breakfast.   amLODipine (NORVASC) 5 MG tablet TAKE 1 TABLET BY MOUTH  DAILY   anastrozole (ARIMIDEX) 1 MG tablet TAKE 1 TABLET BY MOUTH  DAILY   Biotin 5000 MCG CAPS Take 5,000 mcg by mouth at bedtime.   Cholecalciferol (VITAMIN D-3) 1000 UNITS CAPS Take 1,000 Units by mouth at bedtime.    Coenzyme Q10 (CO Q 10) 100 MG CAPS Take 100 mg by mouth at bedtime.    COLLAGEN PO Take by mouth daily.   levothyroxine (SYNTHROID, LEVOTHROID) 25 MCG tablet Take 25 mcg by mouth daily before breakfast.   metFORMIN (GLUCOPHAGE-XR) 500 MG 24 hr tablet Take 1 tablet (500 mg total) by mouth daily with breakfast. NO MORE REFILLS WITHOUT OFFICE VISIT - 2ND NOTICE   metoprolol tartrate (LOPRESSOR) 50 MG tablet Take 50 mg by mouth daily.   ONETOUCH VERIO test strip CHECK BS BID   oxybutynin (DITROPAN-XL) 10 MG 24 hr tablet Take 10 mg by mouth at bedtime.   Pitavastatin Magnesium (ZYPITAMAG) 2 MG TABS Take 1 tablet by mouth daily.   potassium chloride SA (KLOR-CON) 20 MEQ tablet Take 20 mEq by mouth daily.   Red Yeast Rice 600 MG CAPS Take 600 mg by mouth at bedtime.   rivaroxaban (XARELTO) 20 MG TABS tablet TAKE 1  TABLET BY MOUTH  DAILY WITH SUPPER   Zinc 50 MG TABS Take 50 mg by mouth at bedtime.   Current Facility-Administered Medications for the 02/17/21 encounter (Video Visit) with Pixie Casino, MD  Medication   0.9 %  sodium chloride infusion     ROS: Pertinent items noted in HPI and remainder of comprehensive ROS otherwise negative.  Labs/Other Tests and Data Reviewed:    Recent Labs: No results found for requested labs within last 8760 hours.   Recent Lipid Panel Lab Results  Component Value Date/Time  CHOL 180 04/07/2013 10:58 AM   TRIG 179 (H) 04/07/2013 10:58 AM   HDL 62 04/07/2013 10:58 AM   CHOLHDL 2.9 04/07/2013 10:58 AM   LDLCALC 82 04/07/2013 10:58 AM    Wt Readings from Last 3 Encounters:  02/17/21 215 lb (97.5 kg)  06/22/20 220 lb 3.2 oz (99.9 kg)  04/20/20 218 lb 12.8 oz (99.2 kg)     Exam:    Vital Signs:  BP 121/74    Pulse 68    Wt 215 lb (97.5 kg)    BMI 35.78 kg/m    General appearance: alert, no distress, and moderately obese Lungs: No visual respiratory difficulty Abdomen: Obese Extremities: extremities normal, atraumatic, no cyanosis or edema Skin: Skin color, texture, turgor normal. No rashes or lesions Neurologic: Grossly normal Psych: Pleasant  ASSESSMENT & PLAN:    Mixed dyslipidemia, goal LDL less than 70 Type 2 diabetes Moderate obesity PAF Obstructive sleep apnea History of breast cancer History of statin intolerance-myalgias  Mrs. Arruda has a mixed dyslipidemia but remains well above her target LDL.  She is tolerating reduced rice but has had minimal improvement with that.  She has type 2 diabetes and concomitant risk factors including sleep apnea, atrial fibrillation and moderate obesity.  I would target her LDL to less than 70 for more aggressive therapy.  In the past she had tolerated Livalo but this was stopped due to her insurance not paying for it.  I advised her there was an alternative generic option called Zypitamag which  we could possibly get direct from mail-order at a lower cost.  She was interested in that we will prescribe 2 mg daily.  Plan repeat lipids in about 3 months.  She was advised to stop her red yeast rice which cannot be taken with the statin.  If she remains above target, we could consider adding ezetimibe.  Plan follow-up with me in 3 to 4 months.  Thanks as always for the kind referral.  COVID-19 Education: The signs and symptoms of COVID-19 were discussed with the patient and how to seek care for testing (follow up with PCP or arrange E-visit).  The importance of social distancing was discussed today.  Patient Risk:   After full review of this patients clinical status, I feel that they are at least moderate risk at this time.  Time:   Today, I have spent 25 minutes with the patient with telehealth technology discussing dyslipidemia.     Medication Adjustments/Labs and Tests Ordered: Current medicines are reviewed at length with the patient today.  Concerns regarding medicines are outlined above.   Tests Ordered: Orders Placed This Encounter  Procedures   Lipid panel    Medication Changes: Meds ordered this encounter  Medications   Pitavastatin Magnesium (ZYPITAMAG) 2 MG TABS    Sig: Take 1 tablet by mouth daily.    Dispense:  30 tablet    Refill:  11    Disposition:  in 3 month(s)  Pixie Casino, MD, [facility], [place] Director of the Advanced Lipid Disorders &  Cardiovascular Risk Reduction Clinic Diplomate of the American Board of Clinical Lipidology Attending Cardiologist  Direct Dial: [phone]   Fax: [phone]  Website:  [email or web address]  Pixie Casino, MD  02/17/2021 8:18 AM

## 2021-03-22 ENCOUNTER — Other Ambulatory Visit: Payer: Self-pay

## 2021-03-22 ENCOUNTER — Other Ambulatory Visit: Payer: Self-pay | Admitting: Internal Medicine

## 2021-03-22 ENCOUNTER — Ambulatory Visit
Admission: RE | Admit: 2021-03-22 | Discharge: 2021-03-22 | Disposition: A | Discharge status: Home / Self Care | Payer: Medicare Other | Source: Ambulatory Visit | Attending: Internal Medicine | Admitting: Internal Medicine

## 2021-03-22 ENCOUNTER — Other Ambulatory Visit: Payer: Self-pay | Admitting: Thoracic Surgery (Cardiothoracic Vascular Surgery)

## 2021-03-22 DIAGNOSIS — M25512 Pain in left shoulder: Secondary | ICD-10-CM

## 2021-04-21 ENCOUNTER — Other Ambulatory Visit: Payer: Self-pay | Admitting: Hematology and Oncology

## 2021-05-31 LAB — LIPID PANEL
Chol/HDL Ratio: 2.5 ratio (ref 0.0–4.4)
Cholesterol, Total: 155 mg/dL (ref 100–199)
HDL: 61 mg/dL (ref >39)
LDL Chol Calc (NIH): 73 mg/dL (ref 0–99)
Triglycerides: 120 mg/dL (ref 0–149)
VLDL Cholesterol Cal: 21 mg/dL (ref 5–40)

## 2021-06-07 NOTE — Progress Notes (Signed)
? ?Patient Care Team: ?Thomes Dinning, MD as PCP - General (Internal Medicine) ?Jacelyn Pi, MD (Endocrinology) ?Inda Castle, MD (Inactive) (Gastroenterology) ?Vania Rea, MD (Obstetrics and Gynecology) ?Excell Seltzer, MD (Inactive) as Consulting Physician (General Surgery) ?Nicholas Lose, MD as Consulting Physician (Hematology and Oncology) ?Eppie Gibson, MD as Attending Physician (Radiation Oncology) ?Mauro Kaufmann, RN as Registered Nurse ?Rockwell Germany, RN as Registered Nurse ?Holley Bouche, NP (Inactive) as Nurse Practitioner (Nurse Practitioner) ?Sylvan Cheese, NP as Nurse Practitioner (Nurse Practitioner) ? ?DIAGNOSIS:  ?Encounter Diagnosis  ?Name Primary?  ? Malignant neoplasm of upper-outer quadrant of right breast in female, estrogen receptor positive ([place])   ? ? ?SUMMARY OF ONCOLOGIC HISTORY: ?Oncology History  ?Breast cancer of upper-outer quadrant of right female breast ([place])  ?09/08/2014 Mammogram  ? Right breast: irregular mass with calcifications at 11:00, posterior depth. ? ?  ?09/15/2014 Breast US  ? Right breast: 1.6 cm irregular mass with indistinct margin at 11:00, posterior depth, 6 CFN. Related microcalcifications. Hypoechoic, correlates with mammographic findings. ? ?  ?09/16/2014 Initial Biopsy  ? Right breast biopsy: Invasive ductal carcinoma, grade 2, ER+ 100%, PR- 0%, HER-2 negative, Ki-67 10% ? ?  ?09/16/2014 Clinical Stage  ? Stage IA: T1c N0 ? ?  ?09/23/2014 Procedure  ? Breast/Ovarian panel ([facility]) reveals no clinically signficant variant at ATM, BARD1, BRCA1, BRCA2, BRIP1, CDH1, CHEK2, EPCAM, FANCC, MLH1, MSH2, MSH6, NBN, PALB2, PMS2, PTEN, RAD51C, RAD51D, TP53, and XRCC2.  ? ?  ?11/25/2014 Definitive Surgery  ? Right lumpectomy: IDC 1.7 cm Grade 2 with DCIS, 0/2 LN, ER+ 100, PR- 0%, HER 2 Neg  ? ?  ?11/25/2014 Pathologic Stage  ? Stage IA: T1c N0 ? ?  ?11/25/2014 Oncotype testing  ? Oncotype DX 18, 11% ROR ? ?  ?12/23/2014 - 01/28/2015 Radiation  Therapy  ? Adjuvant RT: Right breast / 50 Gy in 25 fractions ? ?  ?03/02/2015 -  Anti-estrogen oral therapy  ? Anastrozole 1 mg daily ?5 years ? ?  ?03/31/2015 Survivorship  ? Survivorship visit completed and copy of care plan given to patient ? ?  ? ? ?CHIEF COMPLIANT:  Follow-up of right breast cancer on anastrozole therapy ? ?INTERVAL HISTORY: Jessica Mendoza is a ?66 y.o. with above-mentioned history of right breast cancer treated with lumpectomy, radiation, and who is currently on antiestrogen therapy with anastrozole. She states she is tolerating the anastrozole. Denies hot flashes states that she does feel a little bit of joint stiffness. ? ? ?ALLERGIES:  is allergic to crestor [rosuvastatin], fenofibrate, simvastatin, and statins. ? ?MEDICATIONS:  ?Current Outpatient Medications  ?Medication Sig Dispense Refill  ? Alpha Lipoic Acid 200 MG CAPS Take 200 mg by mouth daily before breakfast.    ? amLODipine (NORVASC) 5 MG tablet TAKE 1 TABLET BY MOUTH  DAILY 90 tablet 3  ? anastrozole (ARIMIDEX) 1 MG tablet TAKE 1 TABLET BY MOUTH  DAILY 90 tablet 3  ? Biotin 5000 MCG CAPS Take 5,000 mcg by mouth at bedtime.    ? celecoxib (CELEBREX) 200 MG capsule Take by mouth as needed.    ? Cholecalciferol (VITAMIN D-3) 1000 UNITS CAPS Take 1,000 Units by mouth at bedtime.     ? Coenzyme Q10 (CO Q 10) 100 MG CAPS Take 100 mg by mouth at bedtime.     ? COLLAGEN PO Take by mouth daily.    ? levothyroxine (SYNTHROID, LEVOTHROID) 25 MCG tablet Take 25 mcg by mouth daily before breakfast.    ?  metFORMIN (GLUCOPHAGE-XR) 500 MG 24 hr tablet Take 1 tablet (500 mg total) by mouth daily with breakfast. NO MORE REFILLS WITHOUT OFFICE VISIT - 2ND NOTICE 15 tablet 0  ? metoprolol tartrate (LOPRESSOR) 50 MG tablet Take 50 mg by mouth daily.    ? ONETOUCH VERIO test strip CHECK BS BID  7  ? oxybutynin (DITROPAN-XL) 10 MG 24 hr tablet Take 10 mg by mouth at bedtime.    ? Pitavastatin Magnesium (ZYPITAMAG) 2 MG TABS Take 1 tablet by mouth  daily. 30 tablet 11  ? rivaroxaban (XARELTO) 20 MG TABS tablet TAKE 1 TABLET BY MOUTH  DAILY WITH SUPPER 90 tablet 1  ? ?Current Facility-Administered Medications  ?Medication Dose Route Frequency Provider Last Rate Last Admin  ? 0.9 %  sodium chloride infusion  500 mL Intravenous Continuous Gatha Mayer, MD      ? ? ?PHYSICAL EXAMINATION: ?ECOG PERFORMANCE STATUS: 1 - Symptomatic but completely ambulatory ? ?Vitals:  ? 06/21/21 1115  ?BP: (!) 133/55  ?Pulse: 82  ?Resp: 18  ?Temp: 97.7 ?F (36.5 ?C)  ?SpO2: 99%  ? ?Filed Weights  ? 06/21/21 1115  ?Weight: 215 lb 11.2 oz (97.8 kg)  ? ? ?BREAST: No palpable masses or nodules in either right or left breasts. No palpable axillary supraclavicular or infraclavicular adenopathy no breast tenderness or nipple discharge. (exam performed in the presence of a chaperone) ? ?LABORATORY DATA:  ?I have reviewed the data as listed ? ?  Latest Ref Rng & Units 07/09/2019  ?  9:39 AM 06/24/2019  ?  2:31 PM 05/13/2019  ?  8:31 AM  ?CMP  ?Glucose 70 - 99 mg/dL 80   77   91    ?BUN 8 - 23 mg/dL '18   12   13    ' ?Creatinine 0.44 - 1.00 mg/dL 1.00   0.87   0.95    ?Sodium 135 - 145 mmol/L 140   143   140    ?Potassium 3.5 - 5.1 mmol/L 4.5   4.0   3.9    ?Chloride 98 - 111 mmol/L 104   104   102    ?CO2 20 - 29 mmol/L  28   24    ?Calcium 8.7 - 10.3 mg/dL  9.2   9.1    ? ? ?Lab Results  ?Component Value Date  ? WBC 5.3 06/24/2019  ? HGB 13.9 07/09/2019  ? HCT 41.0 07/09/2019  ? MCV 84 06/24/2019  ? PLT 237 06/24/2019  ? NEUTROABS 2.2 02/13/2017  ? ? ?ASSESSMENT & PLAN:  ?Breast cancer of upper-outer quadrant of right female breast ([place]) ?Rt  Lumpectomy 11/25/14: IDC 1.7 cm Grade 2 with DCIS , 0/2 LN, ER 100, PR 0%, Her 2 Neg T1CN0 (Stage 1A) Oncotype DX 18, 11% risk of recurrence ?Adjuvant radiation therapy 12/23/2014 to 01/28/2015 ?  ?Current treatment: Adjuvant antiestrogen therapy with anastrozole 1 mg daily ?7 years started 03/02/2015 ?  ?Anastrozole toxicities: Denies any hot flashes or  arthralgias or myalgias. ?  ?Survivorship: Encouraged her to continue to exercise and stay active.    ?  ?Surveillance for breast cancer:  ?1. Breast exam 06/21/2021: Benign scar tissue ?2. mammograms at [facility]: 11/09/2020 benign breast density category B ?3.  Bone density 11/09/2020: T score 0.5 (used to be 0.2): Normal ? ?  ?Return to clinic in 1 year for follow-up ? ? ? ?No orders of the defined types were placed in this encounter. ? ?The patient has a good  understanding of the overall plan. she agrees with it. she will call with any problems that may develop before the next visit here. ?Total time spent: 30 mins including face to face time and time spent for planning, charting and co-ordination of care ? ? Harriette Ohara, MD ?06/21/21 ? ? ? I Gardiner Coins am scribing for Dr. Lindi Adie ? ?I have reviewed the above documentation for accuracy and completeness, and I agree with the above. ?  ?

## 2021-06-14 ENCOUNTER — Encounter: Payer: Self-pay | Admitting: Internal Medicine

## 2021-06-14 ENCOUNTER — Ambulatory Visit (INDEPENDENT_AMBULATORY_CARE_PROVIDER_SITE_OTHER): Payer: Medicare Other | Admitting: Internal Medicine

## 2021-06-14 VITALS — BP 122/66 | HR 66 | Ht 65.0 in | Wt 216.2 lb

## 2021-06-14 DIAGNOSIS — I480 Paroxysmal atrial fibrillation: Secondary | ICD-10-CM | POA: Diagnosis not present

## 2021-06-14 DIAGNOSIS — E119 Type 2 diabetes mellitus without complications: Secondary | ICD-10-CM | POA: Diagnosis not present

## 2021-06-14 DIAGNOSIS — E785 Hyperlipidemia, unspecified: Secondary | ICD-10-CM

## 2021-06-14 DIAGNOSIS — I10 Essential (primary) hypertension: Secondary | ICD-10-CM | POA: Diagnosis not present

## 2021-06-14 NOTE — Progress Notes (Signed)
? ? ?LIPID CLINIC CONSULT NOTE ? ?Chief Complaint:  ?Follow-up dyslipidemia ? ?Primary Care Physician: ?Thomes Dinning, MD ? ?Primary Cardiologist:  ?None ? ?HPI:  ?Jessica Mendoza is a 66 y.o. female kindly referred by Dr. Marisue Humble for evaluation management of dyslipidemia.  Past medical history significant for breast cancer, type 2 diabetes, struct of sleep apnea and paroxysmal atrial fibrillation managed by Dr. Caryl Comes.  No known history of coronary disease.  Unfortunately she has been statin intolerant.  Most recently her lipids showed total cholesterol 251 and LDL 164 only on red yeast rice.  She has been statin intolerant in the past having had myalgias.  She was able to tolerate Livalo, but her insurance stopped paying for the medication.  She thinks she was taking 2 mg either daily or every other day. ? ?06/14/2021 ? ?Jessica Mendoza returns today for follow-up.  She is doing very well on symptomatic.  She gets this from [facility] and has tolerated it without any issues.  Cholesterol has come down nicely with total now 155, triglycerides 120, HDL 61 and LDL 73.  She is very pleased with the medicine overall. ? ?PMHx:  ?Past Medical History:  ?Diagnosis Date  ? Afib ([place])   ? Arthritis   ? WRISTS AND NECK  ? Benign positional vertigo   ? Bloating   ? Breast cancer ([place]) 2016  ? ER+/PR-/Her2-  ? Colon polyps 12/2005  ? Diabetes mellitus, type 2 ([place]) 06/2010  ? Essential hypertension, benign   ? Family history of hypertrophic cardiomyopathy   ? Family history of ovarian cancer   ? Family history of pancreatic cancer   ? Focal nodular hyperplasia of liver   ? bx 2002  ? Glaucoma   ? Goiter 10/2006  ? Hot flashes   ? Hypothyroidism   ? Migraines   ? menstrual  ? Obesity   ? OSA (obstructive sleep apnea)   ? Other and unspecified hyperlipidemia   ? Sleep apnea   ? ? ?Past Surgical History:  ?Procedure Laterality Date  ? BREAST LUMPECTOMY    ? right breast- benign  ? BREAST LUMPECTOMY WITH RADIOACTIVE SEED AND  SENTINEL LYMPH NODE BIOPSY Right 11/25/2014  ? Procedure: RIGHT BREAST LUMPECTOMY WITH RADIOACTIVE SEED AND RIGHT AXILLARY SENTINEL LYMPH NODE BIOPSY;  Surgeon: Excell Seltzer, MD;  Location: [place];  Service: General;  Laterality: Right;  ? CARDIOVERSION N/A 07/11/2012  ? Procedure: CARDIOVERSION;  Surgeon: Larey Dresser, MD;  Location: [facility];  Service: Cardiovascular;  Laterality: N/A;  ? CARDIOVERSION N/A 07/09/2019  ? Procedure: CARDIOVERSION;  Surgeon: Lelon Perla, MD;  Location: [facility];  Service: Cardiovascular;  Laterality: N/A;  ? LIVER BIOPSY  2002  ? ? ?FAMHx:  ?Family History  ?Problem Relation Age of Onset  ? Hypertension Mother   ? Heart attack Mother   ? Alcohol abuse Father   ? Hypertension Brother   ? Hyperlipidemia Brother   ? Ovarian cancer Maternal Aunt 56  ? Pancreatic cancer Maternal Grandfather 16  ? Breast cancer Cousin 27  ?     Mother's paternal first cousin, had breast cancer again at 76  ? Ovarian cancer Cousin 22  ?     "groin cancer"  ? Hypertension Paternal Grandmother   ? Diabetes Paternal Grandmother   ? Liver cancer Paternal Aunt 60  ?     ETOH related  ? Colon cancer Neg Hx   ? Stomach cancer Neg Hx   ? Rectal  cancer Neg Hx   ? Esophageal cancer Neg Hx   ? ? ?SOCHx:  ? reports that she quit smoking about 33 years ago. Her smoking use included cigarettes. She has a 12.50 pack-year smoking history. She has never used smokeless tobacco. She reports current alcohol use. She reports that she does not use drugs. ? ?ALLERGIES:  ?Allergies  ?Allergen Reactions  ? Crestor [Rosuvastatin]   ?  Other reaction(s): myalgias (2015)  ? Fenofibrate Other (See Comments)  ?  myalgias  ? Simvastatin   ?  Myalgias  ?? lipitor ?  ? Statins Other (See Comments)  ? ? ?ROS: ?Pertinent items noted in HPI and remainder of comprehensive ROS otherwise negative. ? ?HOME MEDS: ?Current Outpatient Medications on File Prior to Visit  ?Medication Sig Dispense Refill  ? Alpha  Lipoic Acid 200 MG CAPS Take 200 mg by mouth daily before breakfast.    ? amLODipine (NORVASC) 5 MG tablet TAKE 1 TABLET BY MOUTH  DAILY 90 tablet 3  ? anastrozole (ARIMIDEX) 1 MG tablet TAKE 1 TABLET BY MOUTH  DAILY 90 tablet 3  ? Biotin 5000 MCG CAPS Take 5,000 mcg by mouth at bedtime.    ? celecoxib (CELEBREX) 200 MG capsule Take by mouth as needed.    ? Cholecalciferol (VITAMIN D-3) 1000 UNITS CAPS Take 1,000 Units by mouth at bedtime.     ? Coenzyme Q10 (CO Q 10) 100 MG CAPS Take 100 mg by mouth at bedtime.     ? COLLAGEN PO Take by mouth daily.    ? levothyroxine (SYNTHROID, LEVOTHROID) 25 MCG tablet Take 25 mcg by mouth daily before breakfast.    ? metFORMIN (GLUCOPHAGE-XR) 500 MG 24 hr tablet Take 1 tablet (500 mg total) by mouth daily with breakfast. NO MORE REFILLS WITHOUT OFFICE VISIT - 2ND NOTICE 15 tablet 0  ? metoprolol tartrate (LOPRESSOR) 50 MG tablet Take 50 mg by mouth daily.    ? ONETOUCH VERIO test strip CHECK BS BID  7  ? oxybutynin (DITROPAN-XL) 10 MG 24 hr tablet Take 10 mg by mouth at bedtime.    ? Pitavastatin Magnesium (ZYPITAMAG) 2 MG TABS Take 1 tablet by mouth daily. 30 tablet 11  ? rivaroxaban (XARELTO) 20 MG TABS tablet TAKE 1 TABLET BY MOUTH  DAILY WITH SUPPER 90 tablet 1  ? potassium chloride SA (KLOR-CON) 20 MEQ tablet Take 20 mEq by mouth daily. (Patient not taking: Reported on 06/14/2021)    ? ?Current Facility-Administered Medications on File Prior to Visit  ?Medication Dose Route Frequency Provider Last Rate Last Admin  ? 0.9 %  sodium chloride infusion  500 mL Intravenous Continuous Gatha Mayer, MD      ? ? ?LABS/IMAGING: ?No results found for this or any previous visit (from the past 48 hour(s)). ?No results found. ? ?LIPID PANEL: ?   ?Component Value Date/Time  ? CHOL 155 05/31/2021 0821  ? TRIG 120 05/31/2021 0821  ? HDL 61 05/31/2021 0821  ? CHOLHDL 2.5 05/31/2021 0821  ? CHOLHDL 2.9 04/07/2013 1058  ? VLDL 36 04/07/2013 1058  ? [place] 73 05/31/2021 0821   ? ? ?WEIGHTS: ?Wt Readings from Last 3 Encounters:  ?06/14/21 216 lb 3.2 oz (98.1 kg)  ?02/17/21 215 lb (97.5 kg)  ?06/22/20 220 lb 3.2 oz (99.9 kg)  ? ? ?VITALS: ?BP 122/66   Pulse 66   Ht _0  (1.651 m)   Wt 216 lb 3.2 oz (98.1 kg)   SpO2 98%   BMI  35.98 kg/m?  ? ?EXAM: ?Deferred ? ?EKG: ?Deferred ? ?ASSESSMENT: ?Mixed dyslipidemia, goal LDL less than 70 ?Type 2 diabetes ?Moderate obesity ?PAF ?Obstructive sleep apnea ?History of breast cancer ?History of statin intolerance-myalgias ? ?PLAN: ?1.   Jessica Mendoza has done very well on Zypitamag with her LDL cholesterol very near target LDL less than 70.  I think with some more activity, weight loss and dietary changes she will reach those targets.  Overall she is pleased with the medicine is not having any side effects.  We will plan repeat lipids in about 6 months but otherwise continue on her current therapy. ? ?Pixie Casino, MD, [facility], FACP  ?[place]  ?Medical Director of the Advanced Lipid Disorders &  ?Cardiovascular Risk Reduction Clinic ?Diplomate of the [employer] of Clinical Lipidology ?Attending Cardiologist  ?Direct Dial: [phone]  Fax: [phone]  ?Website:  [email or web address] ? ?Nadean Corwin Shadavia Dampier ?06/14/2021, 1:25 PM ? ? ?

## 2021-06-14 NOTE — Patient Instructions (Signed)
Medication Instructions:  ?CONTINUE current medicaitons ? ?*If you need a refill on your cardiac medications before your next appointment, please call your pharmacy* ? ? ?Lab Work: ?FASTING lipid panel in 6 months  ? ?If you have labs (blood work) drawn today and your tests are completely normal, you will receive your results only by: ?MyChart Message (if you have MyChart) OR ?A paper copy in the mail ?If you have any lab test that is abnormal or we need to change your treatment, we will call you to review the results. ? ? ?Testing/Procedures: ?NONE ? ? ?Follow-Up: ?At [facility], you and your health needs are our priority.  As part of our continuing mission to provide you with exceptional heart care, we have created designated Provider Care Teams.  These Care Teams include your primary Cardiologist (physician) and Advanced Practice Providers (APPs -  Physician Assistants and Nurse Practitioners) who all work together to provide you with the care you need, when you need it. ? ?We recommend signing up for the patient portal called "MyChart".  Sign up information is provided on this After Visit Summary.  MyChart is used to connect with patients for Virtual Visits (Telemedicine).  Patients are able to view lab/test results, encounter notes, upcoming appointments, etc.  Non-urgent messages can be sent to your provider as well.   ?To learn more about what you can do with MyChart, go to [email or web address].   ? ?Your next appointment:   ?6 month(s) ? ?The format for your next appointment:   ?In Person ? ?Provider:   ?Dr. Lyman Bishop -- lipid clinic ?

## 2021-06-21 ENCOUNTER — Other Ambulatory Visit: Payer: Self-pay

## 2021-06-21 ENCOUNTER — Inpatient Hospital Stay: Payer: Medicare Other | Attending: Hematology and Oncology | Admitting: Hematology and Oncology

## 2021-06-21 DIAGNOSIS — Z79899 Other long term (current) drug therapy: Secondary | ICD-10-CM | POA: Insufficient documentation

## 2021-06-21 DIAGNOSIS — C50411 Malignant neoplasm of upper-outer quadrant of right female breast: Secondary | ICD-10-CM | POA: Insufficient documentation

## 2021-06-21 DIAGNOSIS — Z7901 Long term (current) use of anticoagulants: Secondary | ICD-10-CM | POA: Insufficient documentation

## 2021-06-21 DIAGNOSIS — Z170 Estrogen receptor positive status [ER+]: Secondary | ICD-10-CM | POA: Insufficient documentation

## 2021-06-21 DIAGNOSIS — M2560 Stiffness of unspecified joint, not elsewhere classified: Secondary | ICD-10-CM | POA: Diagnosis not present

## 2021-06-21 DIAGNOSIS — Z888 Allergy status to other drugs, medicaments and biological substances status: Secondary | ICD-10-CM | POA: Insufficient documentation

## 2021-06-21 DIAGNOSIS — Z79811 Long term (current) use of aromatase inhibitors: Secondary | ICD-10-CM | POA: Insufficient documentation

## 2021-06-21 NOTE — Assessment & Plan Note (Signed)
Rt? Lumpectomy 11/25/14: IDC 1.7 cm Grade 2 with DCIS , 0/2 LN, ER 100, PR 0%, Her 2 Neg T1CN0 (Stage 1A) Oncotype DX 18, 11% risk of recurrence ?Adjuvant radiation therapy 12/23/2014 to 01/28/2015 ?? ?Current treatment: Adjuvant antiestrogen therapy with anastrozole 1 mg daily ?5 years started 03/02/2015 ?? ?Anastrozole toxicities:?Denies any hot flashes or arthralgias or myalgias. ?? ?Survivorship: Encouraged her to continue to exercise and stay active.  She has gained few pounds this past year and I have encouraged her to exercise more regularly. ?? ?Surveillance for breast cancer:  ?1. Breast exam?06/21/2021: Benign scar tissue ?2. mammograms at [facility]:?11/09/2020?benign breast density category B ?3.??Bone density?11/09/2020: T score 0.5 (used to be 0.2): Normal ? ?? ?Return to clinic in?1 year?for follow-up ?

## 2021-07-31 ENCOUNTER — Other Ambulatory Visit: Payer: Self-pay | Admitting: Internal Medicine

## 2021-07-31 DIAGNOSIS — I480 Paroxysmal atrial fibrillation: Secondary | ICD-10-CM

## 2021-07-31 DIAGNOSIS — Z8249 Family history of ischemic heart disease and other diseases of the circulatory system: Secondary | ICD-10-CM

## 2021-07-31 NOTE — Telephone Encounter (Signed)
Xarelto '20mg'$  refill request received. Pt is 66 years old, weight-97.8kg, Crea-0.88 on 10/19/2020 via [place] at [facility], last seen by Dr. Debara Pickett on 06/14/2021, Diagnosis-Afib, CrCl-97.53m/min; Dose is appropriate based on dosing criteria. Will send in refill to requested pharmacy.

## 2021-10-17 DIAGNOSIS — Z0289 Encounter for other administrative examinations: Secondary | ICD-10-CM

## 2021-11-08 ENCOUNTER — Ambulatory Visit (INDEPENDENT_AMBULATORY_CARE_PROVIDER_SITE_OTHER): Payer: Medicare Other | Admitting: Family Medicine

## 2021-11-08 ENCOUNTER — Encounter (INDEPENDENT_AMBULATORY_CARE_PROVIDER_SITE_OTHER): Payer: Self-pay | Admitting: Family Medicine

## 2021-11-08 VITALS — BP 132/78 | HR 79 | Temp 98.9°F | Ht 65.0 in | Wt 214.0 lb

## 2021-11-08 DIAGNOSIS — I4891 Unspecified atrial fibrillation: Secondary | ICD-10-CM | POA: Diagnosis not present

## 2021-11-08 DIAGNOSIS — E119 Type 2 diabetes mellitus without complications: Secondary | ICD-10-CM

## 2021-11-08 DIAGNOSIS — R5383 Other fatigue: Secondary | ICD-10-CM | POA: Diagnosis not present

## 2021-11-08 DIAGNOSIS — E559 Vitamin D deficiency, unspecified: Secondary | ICD-10-CM | POA: Insufficient documentation

## 2021-11-08 DIAGNOSIS — E669 Obesity, unspecified: Secondary | ICD-10-CM | POA: Diagnosis not present

## 2021-11-08 DIAGNOSIS — I152 Hypertension secondary to endocrine disorders: Secondary | ICD-10-CM | POA: Insufficient documentation

## 2021-11-08 DIAGNOSIS — F32A Depression, unspecified: Secondary | ICD-10-CM | POA: Insufficient documentation

## 2021-11-08 DIAGNOSIS — E1159 Type 2 diabetes mellitus with other circulatory complications: Secondary | ICD-10-CM | POA: Insufficient documentation

## 2021-11-08 DIAGNOSIS — E66812 Obesity, class 2: Secondary | ICD-10-CM

## 2021-11-08 DIAGNOSIS — R0602 Shortness of breath: Secondary | ICD-10-CM | POA: Diagnosis not present

## 2021-11-08 DIAGNOSIS — I10 Essential (primary) hypertension: Secondary | ICD-10-CM

## 2021-11-08 DIAGNOSIS — F3289 Other specified depressive episodes: Secondary | ICD-10-CM

## 2021-11-08 DIAGNOSIS — Z8481 Family history of carrier of genetic disease: Secondary | ICD-10-CM

## 2021-11-08 DIAGNOSIS — Z7984 Long term (current) use of oral hypoglycemic drugs: Secondary | ICD-10-CM

## 2021-11-08 DIAGNOSIS — Z6835 Body mass index (BMI) 35.0-35.9, adult: Secondary | ICD-10-CM

## 2021-11-10 LAB — CBC WITH DIFFERENTIAL/PLATELET
Basophils Absolute: 0 10*3/uL (ref 0.0–0.2)
Basos: 0 %
EOS (ABSOLUTE): 0.1 10*3/uL (ref 0.0–0.4)
Eos: 2 %
Hematocrit: 37.9 % (ref 34.0–46.6)
Hemoglobin: 12.5 g/dL (ref 11.1–15.9)
Immature Grans (Abs): 0 10*3/uL (ref 0.0–0.1)
Immature Granulocytes: 0 %
Lymphocytes Absolute: 1.4 10*3/uL (ref 0.7–3.1)
Lymphs: 20 %
MCH: 26.8 pg (ref 26.6–33.0)
MCHC: 33 g/dL (ref 31.5–35.7)
MCV: 81 fL (ref 79–97)
Monocytes Absolute: 0.6 10*3/uL (ref 0.1–0.9)
Monocytes: 8 %
Neutrophils Absolute: 5 10*3/uL (ref 1.4–7.0)
Neutrophils: 70 %
Platelets: 234 10*3/uL (ref 150–450)
RBC: 4.67 x10E6/uL (ref 3.77–5.28)
RDW: 14.9 % (ref 11.7–15.4)
WBC: 7.2 10*3/uL (ref 3.4–10.8)

## 2021-11-10 LAB — COMPREHENSIVE METABOLIC PANEL
ALT: 19 IU/L (ref 0–32)
AST: 14 IU/L (ref 0–40)
Albumin/Globulin Ratio: 1.8 (ref 1.2–2.2)
Albumin: 4.8 g/dL (ref 3.9–4.9)
Alkaline Phosphatase: 114 IU/L (ref 44–121)
BUN/Creatinine Ratio: 10 — ABNORMAL LOW (ref 12–28)
BUN: 9 mg/dL (ref 8–27)
Bilirubin Total: 0.6 mg/dL (ref 0.0–1.2)
CO2: 26 mmol/L (ref 20–29)
Calcium: 9.5 mg/dL (ref 8.7–10.3)
Chloride: 98 mmol/L (ref 96–106)
Creatinine, Ser: 0.87 mg/dL (ref 0.57–1.00)
Globulin, Total: 2.7 g/dL (ref 1.5–4.5)
Glucose: 167 mg/dL — ABNORMAL HIGH (ref 70–99)
Potassium: 3.9 mmol/L (ref 3.5–5.2)
Sodium: 140 mmol/L (ref 134–144)
Total Protein: 7.5 g/dL (ref 6.0–8.5)
eGFR: 73 mL/min/{1.73_m2} (ref >59)

## 2021-11-10 LAB — VITAMIN B12: Vitamin B-12: >2000 pg/mL — ABNORMAL HIGH (ref 232–1245)

## 2021-11-10 LAB — INSULIN, RANDOM: INSULIN: 22.3 u[IU]/mL (ref 2.6–24.9)

## 2021-11-10 LAB — VITAMIN D 25 HYDROXY (VIT D DEFICIENCY, FRACTURES): Vit D, 25-Hydroxy: 33.8 ng/mL (ref 30.0–100.0)

## 2021-11-10 LAB — HEMOGLOBIN A1C
Est. average glucose Bld gHb Est-mCnc: 174 mg/dL
Hgb A1c MFr Bld: 7.7 % — ABNORMAL HIGH (ref 4.8–5.6)

## 2021-11-10 LAB — FOLATE: Folate: 5.5 ng/mL (ref >3.0)

## 2021-11-10 LAB — T4, FREE: Free T4: 1.08 ng/dL (ref 0.82–1.77)

## 2021-11-10 LAB — TSH: TSH: 1.11 u[IU]/mL (ref 0.450–4.500)

## 2021-11-13 NOTE — Progress Notes (Unsigned)
Chief Complaint:   OBESITY Jessica Mendoza (MR# [id number]) is a 66 y.o. female who presents for evaluation and treatment of obesity and related comorbidities. Current BMI is Body mass index is 35.61 kg/m. Jessica Mendoza has been struggling with her weight for many years and has been unsuccessful in either losing weight, maintaining weight loss, or reaching her healthy weight goal.  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  She gained weight after quitting smoking in her 30's.  She craves sweets, snacks on chocolate and cooks.  She skips breakfast, she has poor support at home.  Her husband drinks SSB's.  She denies big portions.  Failure to meal plans lead to poor choices.   Jessica Mendoza is currently in the action stage of change and ready to dedicate time achieving and maintaining a healthier weight. Jessica Mendoza is interested in becoming our patient and working on intensive lifestyle modifications including (but not limited to) diet and exercise for weight loss.  Jessica Mendoza's habits were reviewed today and are as follows: Her family eats meals together, she thinks her family will eat healthier with her, her desired weight loss is 56 lbs, she started gaining weight when she was about 67 years old, her heaviest weight ever was 223 pounds, she has significant food cravings issues, she snacks frequently in the evenings, she skips meals frequently, she is trying to follow a vegetarian diet, she is trying to follow a vegan diet, she is frequently drinking liquids with calories, she frequently makes poor food choices, she frequently eats larger portions than normal, and she struggles with emotional eating.  Depression Screen Jessica Mendoza's Food and Mood (modified PHQ-9) score was 7.     11/08/2021    8:16 AM  Depression screen PHQ 2/9  Decreased Interest 1  Down, Depressed, Hopeless 1  PHQ - 2 Score 2  Altered sleeping 0  Tired, decreased energy 1  Change in appetite 1  Feeling bad or failure about yourself  0   Trouble concentrating 2  Moving slowly or fidgety/restless 1  Suicidal thoughts 0  PHQ-9 Score 7  Difficult doing work/chores Not difficult at all   Subjective:   1. Other fatigue Jessica Mendoza admits to daytime somnolence and admits to waking up still tired.  Jessica Mendoza generally gets 6 or 7 hours of sleep per night, and states that she has generally restful sleep. Snoring is present. Apneic episodes are not present. Epworth Sleepiness Score is 10.   2. SOBOE (shortness of breath on exertion) Jessica Mendoza notes increasing shortness of breath with exercising and seems to be worsening over time with weight gain. She notes getting out of breath sooner with activity than she used to. This has not gotten worse recently. Jessica Mendoza denies shortness of breath at rest or orthopnea.  3. Type 2 diabetes mellitus without complication, without long-term current use of insulin ([place]) She is on metformin 500 mg XR, once a day with food. Had diarrhea on higher doses.   4. Essential hypertension Blood pressure is well controlled on metoprolol 50 mg daily and Norvasc 5 mg daily.   5. Atrial fibrillation, unspecified type ([place]) She is on Xarelto 20 mg daily.  S/P cardioversion 2021. Managed by Dr Debara Pickett.  She denies palpitations.  EKG: NSR today.   6. Other depression with emotional eating PHQ:9:7.  She is not on any medications for for mood.  Positive stress eating history.   7. Family history of BRCA gene mutation 2016- Diagnosed.  Seeing Dr Jessica Mendoza.  On Arimidex for  2 more years.   Assessment/Plan:   1. Other fatigue Jessica Mendoza does feel that her weight is causing her energy to be lower than it should be. Fatigue may be related to obesity, depression or many other causes. Labs will be ordered, and in the meanwhile, Jessica Mendoza will focus on self care including making healthy food choices, increasing physical activity and focusing on stress reduction.  - EKG 12-Lead - VITAMIN D 25 Hydroxy (Vit-D Deficiency, Fractures) - TSH -  T4, free - Folate - Comprehensive metabolic panel - Vitamin H47 - CBC with Differential/Platelet  2. SOBOE (shortness of breath on exertion) Jessica Mendoza does feel that she gets out of breath more easily that she used to when she exercises. Jessica Mendoza's shortness of breath appears to be obesity related and exercise induced. She has agreed to work on weight loss and gradually increase exercise to treat her exercise induced shortness of breath. Will continue to monitor closely.  3. Type 2 diabetes mellitus without complication, without long-term current use of insulin ([place]) Consider use of Mounjaro.  Reduce intake of added sugar.  Check labs today.  - Insulin, random - Hemoglobin A1c  4. Essential hypertension Continue current medications per cardiology.   5. Atrial fibrillation, unspecified type ([place]) Avoid use of phentermine.   6. Other depression with emotional eating Consider use of Wellbutrin.   7. Family history of BRCA gene mutation Begin work on reducing body fat % to reduce future incidence of weight, related cancers.   8. Obesity,current BMI 35.6 Jessica Mendoza is currently in the action stage of change and her goal is to continue with weight loss efforts. I recommend [name] begin the structured treatment plan as follows:  She has agreed to the Category 1 Plan. 100 calorie snack sheet given.   Exercise goals: All adults should avoid inactivity. Some physical activity is better than none, and adults who participate in any amount of physical activity gain some health benefits.   Behavioral modification strategies: increasing lean protein intake, decreasing simple carbohydrates, increasing vegetables, increasing water intake, decreasing eating out, no skipping meals, meal planning and cooking strategies, keeping healthy foods in the home, emotional eating strategies, and decreasing junk food.  She was informed of the importance of frequent follow-up visits to maximize her success with  intensive lifestyle modifications for her multiple health conditions. She was informed we would discuss her lab results at her next visit unless there is a critical issue that needs to be addressed sooner. Shylah agreed to keep her next visit at the agreed upon time to discuss these results.  Objective:   Blood pressure 132/78, pulse 79, temperature 98.9 F (37.2 C), height '5\' 5"'  (1.651 m), weight 214 lb (97.1 kg), SpO2 98 %. Body mass index is 35.61 kg/m.  EKG: Normal sinus rhythm, rate 76.  Indirect Calorimeter completed today shows a VO2 of 214 and a REE of 1469.  Her calculated basal metabolic rate is 6546 thus her basal metabolic rate is worse than expected.  General: Cooperative, alert, well developed, in no acute distress. HEENT: Conjunctivae and lids unremarkable. Cardiovascular: Regular rhythm.  Lungs: Normal work of breathing. Neurologic: No focal deficits.   Lab Results  Component Value Date   CREATININE 0.87 11/08/2021   BUN 9 11/08/2021   NA 140 11/08/2021   K 3.9 11/08/2021   CL 98 11/08/2021   CO2 26 11/08/2021   Lab Results  Component Value Date   ALT 19 11/08/2021   AST 14 11/08/2021   ALKPHOS 114 11/08/2021  BILITOT 0.6 11/08/2021   Lab Results  Component Value Date   HGBA1C 7.7 (H) 11/08/2021   HGBA1C 6.7 (H) 04/07/2013   HGBA1C 5.9 11/25/2012   HGBA1C 6.3 07/15/2012   HGBA1C 6.0 03/18/2012   Lab Results  Component Value Date   INSULIN 22.3 11/08/2021   Lab Results  Component Value Date   TSH 1.110 11/08/2021   Lab Results  Component Value Date   CHOL 155 05/31/2021   HDL 61 05/31/2021   LDLCALC 73 05/31/2021   TRIG 120 05/31/2021   CHOLHDL 2.5 05/31/2021   Lab Results  Component Value Date   WBC 7.2 11/08/2021   HGB 12.5 11/08/2021   HCT 37.9 11/08/2021   MCV 81 11/08/2021   PLT 234 11/08/2021   No results found for: "IRON", "TIBC", "FERRITIN" Obesity Behavioral Intervention:   Approximately 15 minutes were spent on the  discussion below.  ASK: We discussed the diagnosis of obesity with Naijah today and Samauri agreed to give [place] permission to discuss obesity behavioral modification therapy today.  ASSESS: Keyonia has the diagnosis of obesity and her BMI today is 35.6. Thyra is in the action stage of change.   ADVISE: Sevilla was educated on the multiple health risks of obesity as well as the benefit of weight loss to improve her health. She was advised of the need for long term treatment and the importance of lifestyle modifications to improve her current health and to decrease her risk of future health problems.  AGREE: Multiple dietary modification options and treatment options were discussed and Sahasra agreed to follow the recommendations documented in the above note.  ARRANGE: Laurenashley was educated on the importance of frequent visits to treat obesity as outlined per CMS and USPSTF guidelines and agreed to schedule her next follow up appointment today.  Attestation Statements:   Reviewed by clinician on day of visit: allergies, medications, problem list, medical history, surgical history, family history, social history, and previous encounter notes.  I, Davy Pique, am acting as Location manager for Loyal Gambler, DO.  I have reviewed the above documentation for accuracy and completeness, and I agree with the above. Dell Ponto, DO

## 2021-11-20 ENCOUNTER — Ambulatory Visit (INDEPENDENT_AMBULATORY_CARE_PROVIDER_SITE_OTHER): Payer: 59 | Admitting: Family Medicine

## 2021-11-22 ENCOUNTER — Encounter (INDEPENDENT_AMBULATORY_CARE_PROVIDER_SITE_OTHER): Payer: Self-pay | Admitting: Family Medicine

## 2021-11-22 ENCOUNTER — Ambulatory Visit (INDEPENDENT_AMBULATORY_CARE_PROVIDER_SITE_OTHER): Payer: Medicare Other | Admitting: Family Medicine

## 2021-11-22 VITALS — BP 142/84 | HR 72 | Temp 98.0°F | Ht 65.0 in | Wt 212.0 lb

## 2021-11-22 DIAGNOSIS — E119 Type 2 diabetes mellitus without complications: Secondary | ICD-10-CM

## 2021-11-22 DIAGNOSIS — E678 Other specified hyperalimentation: Secondary | ICD-10-CM | POA: Diagnosis not present

## 2021-11-22 DIAGNOSIS — E559 Vitamin D deficiency, unspecified: Secondary | ICD-10-CM | POA: Diagnosis not present

## 2021-11-22 DIAGNOSIS — E669 Obesity, unspecified: Secondary | ICD-10-CM | POA: Diagnosis not present

## 2021-11-22 DIAGNOSIS — Z6835 Body mass index (BMI) 35.0-35.9, adult: Secondary | ICD-10-CM

## 2021-11-22 DIAGNOSIS — E66812 Obesity, class 2: Secondary | ICD-10-CM

## 2021-11-22 DIAGNOSIS — Z7984 Long term (current) use of oral hypoglycemic drugs: Secondary | ICD-10-CM

## 2021-11-22 MED ORDER — TIRZEPATIDE 2.5 MG/0.5ML ~~LOC~~ SOAJ: 2.5000 mg | SUBCUTANEOUS | 0 refills | Status: DC

## 2021-11-22 MED ORDER — VITAMIN D (ERGOCALCIFEROL) 1.25 MG (50000 UNIT) PO CAPS: 50000.0000 [IU] | ORAL_CAPSULE | ORAL | 0 refills | Status: DC

## 2021-11-23 ENCOUNTER — Other Ambulatory Visit: Payer: Self-pay

## 2021-11-23 ENCOUNTER — Inpatient Hospital Stay: Payer: Medicare Other | Attending: Hematology and Oncology | Admitting: Hematology and Oncology

## 2021-11-23 VITALS — BP 142/75 | HR 63 | Temp 97.3°F | Resp 18 | Ht 65.0 in | Wt 213.0 lb

## 2021-11-23 DIAGNOSIS — Z79811 Long term (current) use of aromatase inhibitors: Secondary | ICD-10-CM | POA: Diagnosis not present

## 2021-11-23 DIAGNOSIS — Z79899 Other long term (current) drug therapy: Secondary | ICD-10-CM | POA: Insufficient documentation

## 2021-11-23 DIAGNOSIS — R222 Localized swelling, mass and lump, trunk: Secondary | ICD-10-CM | POA: Insufficient documentation

## 2021-11-23 DIAGNOSIS — Z888 Allergy status to other drugs, medicaments and biological substances status: Secondary | ICD-10-CM | POA: Diagnosis not present

## 2021-11-23 DIAGNOSIS — Z170 Estrogen receptor positive status [ER+]: Secondary | ICD-10-CM | POA: Diagnosis not present

## 2021-11-23 DIAGNOSIS — C50411 Malignant neoplasm of upper-outer quadrant of right female breast: Secondary | ICD-10-CM | POA: Insufficient documentation

## 2021-11-23 DIAGNOSIS — R2232 Localized swelling, mass and lump, left upper limb: Secondary | ICD-10-CM | POA: Insufficient documentation

## 2021-11-23 NOTE — Progress Notes (Signed)
Patient Care Team: Thomes Dinning, MD as PCP - General (Internal Medicine) Jacelyn Pi, MD (Endocrinology) Inda Castle, MD (Inactive) (Gastroenterology) Vania Rea, MD (Obstetrics and Gynecology) Excell Seltzer, MD (Inactive) as Consulting Physician (General Surgery) Nicholas Lose, MD as Consulting Physician (Hematology and Oncology) Eppie Gibson, MD as Attending Physician (Radiation Oncology) Mauro Kaufmann, RN as Registered Nurse Rockwell Germany, RN as Registered Nurse Holley Bouche, NP (Inactive) as Nurse Practitioner (Nurse Practitioner) Sylvan Cheese, NP as Nurse Practitioner (Nurse Practitioner)  DIAGNOSIS:  Encounter Diagnosis  Name Primary?   Malignant neoplasm of upper-outer quadrant of right breast in female, estrogen receptor positive ([place]) Yes    SUMMARY OF ONCOLOGIC HISTORY: Oncology History  Breast cancer of upper-outer quadrant of right female breast ([place])  09/08/2014 Mammogram   Right breast: irregular mass with calcifications at 11:00, posterior depth.   09/15/2014 Breast US   Right breast: 1.6 cm irregular mass with indistinct margin at 11:00, posterior depth, 6 CFN. Related microcalcifications. Hypoechoic, correlates with mammographic findings.   09/16/2014 Initial Biopsy   Right breast biopsy: Invasive ductal carcinoma, grade 2, ER+ 100%, PR- 0%, HER-2 negative, Ki-67 10%   09/16/2014 Clinical Stage   Stage IA: T1c N0   09/23/2014 Procedure   Breast/Ovarian panel ([facility]) reveals no clinically signficant variant at ATM, BARD1, BRCA1, BRCA2, BRIP1, CDH1, CHEK2, EPCAM, FANCC, MLH1, MSH2, MSH6, NBN, PALB2, PMS2, PTEN, RAD51C, RAD51D, TP53, and XRCC2.    11/25/2014 Definitive Surgery   Right lumpectomy: IDC 1.7 cm Grade 2 with DCIS, 0/2 LN, ER+ 100, PR- 0%, HER 2 Neg    11/25/2014 Pathologic Stage   Stage IA: T1c N0   11/25/2014 Oncotype testing   Oncotype DX 18, 11% ROR   12/23/2014 - 01/28/2015 Radiation Therapy    Adjuvant RT: Right breast / 50 Gy in 25 fractions   03/02/2015 -  Anti-estrogen oral therapy   Anastrozole 1 mg daily 5 years   03/31/2015 Survivorship   Survivorship visit completed and copy of care plan given to patient     CHIEF COMPLIANT: Follow-up of right breast cancer on anastrozole therapy    INTERVAL HISTORY: Jessica Mendoza is a 66 y.o. with above-mentioned history of right breast cancer treated with lumpectomy, radiation, and who is currently on antiestrogen therapy with anastrozole. She presents to the clinic for a follow-up. She reports to the clinic a knot under left arm.  She underwent a mammogram and ultrasound at [facility].  Apparently they were not able to fully evaluate this nodule.  They recommended that we obtain a CT chest.    ALLERGIES:  is allergic to crestor [rosuvastatin], fenofibrate, simvastatin, and statins.  MEDICATIONS:  Current Outpatient Medications  Medication Sig Dispense Refill   Alpha Lipoic Acid 200 MG CAPS Take 200 mg by mouth daily before breakfast.     amLODipine (NORVASC) 5 MG tablet TAKE 1 TABLET BY MOUTH  DAILY 90 tablet 3   anastrozole (ARIMIDEX) 1 MG tablet TAKE 1 TABLET BY MOUTH  DAILY 90 tablet 3   Biotin 5000 MCG CAPS Take 5,000 mcg by mouth at bedtime.     celecoxib (CELEBREX) 200 MG capsule Take by mouth as needed.     Cholecalciferol (VITAMIN D-3) 1000 UNITS CAPS Take 1,000 Units by mouth at bedtime.      Coenzyme Q10 (CO Q 10) 100 MG CAPS Take 100 mg by mouth at bedtime.      COLLAGEN PO Take by mouth daily.     diclofenac  Sodium (VOLTAREN) 1 % GEL SMARTSIG:1 sparingly Topical Every 8 Hours PRN     levothyroxine (SYNTHROID, LEVOTHROID) 25 MCG tablet Take 25 mcg by mouth daily before breakfast.     metFORMIN (GLUCOPHAGE-XR) 500 MG 24 hr tablet Take 1 tablet (500 mg total) by mouth daily with breakfast. NO MORE REFILLS WITHOUT OFFICE VISIT - 2ND NOTICE 15 tablet 0   metoprolol tartrate (LOPRESSOR) 50 MG tablet Take 50 mg by mouth daily.      naftifine (NAFTIN) 1 % cream SMARTSIG:sparingly Topical Twice Daily     ONETOUCH VERIO test strip CHECK BS BID  7   oxybutynin (DITROPAN-XL) 10 MG 24 hr tablet Take 10 mg by mouth at bedtime.     oxybutynin (DITROPAN-XL) 10 MG 24 hr tablet Take 1 tablet by mouth daily.     Pitavastatin Magnesium (ZYPITAMAG) 2 MG TABS Take 1 tablet by mouth daily. 30 tablet 11   potassium chloride SA (KLOR-CON M) 20 MEQ tablet      rivaroxaban (XARELTO) 20 MG TABS tablet TAKE 1 TABLET BY MOUTH DAILY  WITH SUPPER 90 tablet 1   tirzepatide (MOUNJARO) 2.5 MG/0.5ML Pen Inject 2.5 mg into the skin once a week. 2 mL 0   Vitamin D, Ergocalciferol, (DRISDOL) 1.25 MG (50000 UNIT) CAPS capsule Take 1 capsule (50,000 Units total) by mouth every 7 (seven) days. 5 capsule 0   No current facility-administered medications for this visit.    PHYSICAL EXAMINATION: ECOG PERFORMANCE STATUS: 1 - Symptomatic but completely ambulatory  Vitals:   11/23/21 1519  BP: (!) 142/75  Pulse: 63  Resp: 18  Temp: (!) 97.3 F (36.3 C)  SpO2: 100%   Filed Weights   11/23/21 1519  Weight: 213 lb (96.6 kg)    BREAST: No palpable masses or nodules in either right or left breasts. No palpable axillary supraclavicular or infraclavicular adenopathy no breast tenderness or nipple discharge. (exam performed in the presence of a chaperone)  LABORATORY DATA:  I have reviewed the data as listed    Latest Ref Rng & Units 11/08/2021    9:09 AM 07/09/2019    9:39 AM 06/24/2019    2:31 PM  CMP  Glucose 70 - 99 mg/dL 167  80  77   BUN 8 - 27 mg/dL '9  18  12   ' Creatinine 0.57 - 1.00 mg/dL 0.87  1.00  0.87   Sodium 134 - 144 mmol/L 140  140  143   Potassium 3.5 - 5.2 mmol/L 3.9  4.5  4.0   Chloride 96 - 106 mmol/L 98  104  104   CO2 20 - 29 mmol/L 26   28   Calcium 8.7 - 10.3 mg/dL 9.5   9.2   Total Protein 6.0 - 8.5 g/dL 7.5     Total Bilirubin 0.0 - 1.2 mg/dL 0.6     Alkaline Phos 44 - 121 IU/L 114     AST 0 - 40 IU/L 14     ALT 0  - 32 IU/L 19       Lab Results  Component Value Date   WBC 7.2 11/08/2021   HGB 12.5 11/08/2021   HCT 37.9 11/08/2021   MCV 81 11/08/2021   PLT 234 11/08/2021   NEUTROABS 5.0 11/08/2021    ASSESSMENT & PLAN:  Breast cancer of upper-outer quadrant of right female breast ([place]) Rt  Lumpectomy 11/25/14: IDC 1.7 cm Grade 2 with DCIS , 0/2 LN, ER 100, PR 0%, Her 2 Neg T1CN0 (  Stage 1A) Oncotype DX 18, 11% risk of recurrence Adjuvant radiation therapy 12/23/2014 to 01/28/2015   Current treatment: Adjuvant antiestrogen therapy with anastrozole 1 mg daily 7 years started 03/02/2015   Anastrozole toxicities: Denies any hot flashes or arthralgias or myalgias.   Survivorship: Encouraged her to continue to exercise and stay active.      Surveillance for breast cancer:  1. Breast exam 11/23/2021: Small nodule in the left chest wall area.  Could be lipoma versus something else.  Therefore we will obtain a CT chest for evaluation. 2. mammograms and ultrasound at [facility]: 11/22/2021: They have not been read yet. 3.  Bone density 11/09/2020: T score 0.5 (used to be 0.2): Normal   Telephone visit after the CT scan to discuss results Return to clinic in 1 year for follow-up   Orders Placed This Encounter  Procedures   CT Chest W Contrast    Standing Status:   Future    Standing Expiration Date:   11/23/2022    Order Specific Question:   If indicated for the ordered procedure, I authorize the administration of contrast media per Radiology protocol    Answer:   Yes    Order Specific Question:   Preferred imaging location?    Answer:   [facility]   The patient has a good understanding of the overall plan. she agrees with it. she will call with any problems that may develop before the next visit here. Total time spent: 30 mins including face to face time and time spent for planning, charting and co-ordination of care   Harriette Ohara, MD 11/23/21    I Gardiner Coins am scribing  for Dr. Lindi Adie  I have reviewed the above documentation for accuracy and completeness, and I agree with the above.

## 2021-11-23 NOTE — Assessment & Plan Note (Signed)
Rt Lumpectomy 11/25/14: IDC 1.7 cm Grade 2 with DCIS , 0/2 LN, ER 100, PR 0%, Her 2 Neg T1CN0 (Stage 1A) Oncotype DX 18, 11% risk of recurrence Adjuvant radiation therapy 12/23/2014 to 01/28/2015  Current treatment: Adjuvant antiestrogen therapy with anastrozole 1 mg daily 7 years started 03/02/2015  Anastrozole toxicities:Denies any hot flashes or arthralgias or myalgias.  Survivorship: Encouraged her to continue to exercise and stay active.   Surveillance for breast cancer:  1. Breast exam5/11/2021: Benign scar tissue 2. mammograms [facility] 11/22/2021: They have not been read yet. 3.Bone density9/28/2022: T score 0.5 (used to be 0.2): Normal   Return to clinic in1 yearfor follow-up

## 2021-11-29 ENCOUNTER — Encounter (HOSPITAL_COMMUNITY): Payer: Self-pay

## 2021-11-29 ENCOUNTER — Ambulatory Visit (HOSPITAL_COMMUNITY)
Admission: RE | Admit: 2021-11-29 | Discharge: 2021-11-29 | Disposition: A | Discharge status: Home / Self Care | Payer: Medicare Other | Source: Ambulatory Visit | Attending: Hematology and Oncology | Admitting: Hematology and Oncology

## 2021-11-29 DIAGNOSIS — C50411 Malignant neoplasm of upper-outer quadrant of right female breast: Secondary | ICD-10-CM | POA: Insufficient documentation

## 2021-11-29 DIAGNOSIS — Z170 Estrogen receptor positive status [ER+]: Secondary | ICD-10-CM | POA: Diagnosis present

## 2021-11-29 MED ORDER — IOHEXOL 300 MG/ML  SOLN
75.0000 mL | Freq: Once | INTRAMUSCULAR | Status: AC | PRN
  Administered 2021-11-29: 75 mL via INTRAVENOUS

## 2021-11-29 MED ORDER — SODIUM CHLORIDE (PF) 0.9 % IJ SOLN
INTRAMUSCULAR | Status: AC
  Filled 2021-11-29: qty 50

## 2021-11-30 ENCOUNTER — Encounter: Payer: Self-pay | Admitting: Hematology and Oncology

## 2021-11-30 NOTE — Progress Notes (Signed)
Chief Complaint:   OBESITY Jessica Mendoza is here to discuss her progress with her obesity treatment plan along with follow-up of her obesity related diagnoses. Jessica Mendoza is on the Category 1 Plan and states she is following her eating plan approximately 60% of the time. Jessica Mendoza states she is wlking 15 minutes 4 times per week.  Today's visit was #: 2 Starting weight: 214 lbs Starting date: 11/08/2021 Today's weight: 212 lbs Today's date: 11/22/2021 Total lbs lost to date: 2 lbs Total lbs lost since last in-office visit: 2 lbs  Interim History: She had a cold or allergies between visits.  Had soup and oranges.  Celebrated  her birthday but gave away extra cake.  Trying some of the foods on the meal plan.  Likes skinny girl products and likes the 100 calorie snacks.  Plans to increase home exercises.   Subjective:   1. Vitamin D deficiency Discussed labs with patient today. Vitamin D level 33.8, inconsistently taking Vitamin D supplement.   2. Type 2 diabetes mellitus without complication, without long-term current use of insulin ([place]) Discussed labs with patient today. She is on metformin 500 XR daily with food.  Has reduced sugar intake.  A1c 7.7 up from 6.7.  3. Excessive vitamin B12 intake Discussed labs with patient today. Taking OTC Vitamin B-12 5,000 mcg daily. B-12 level >2,000.  Assessment/Plan:   1. Vitamin D deficiency Low Vitamin D level contributes to fatigue and are associated with obesity, breast, and colon cancer. She agrees to continue to take prescription Vitamin D '@50'$ ,000 IU every week and will follow-up for routine testing of Vitamin D, at least 2-3 times per year to avoid over-replacement.  Refill - Vitamin D, Ergocalciferol, (DRISDOL) 1.25 MG (50000 UNIT) CAPS capsule; Take 1 capsule (50,000 Units total) by mouth every 7 (seven) days.  Dispense: 5 capsule; Refill: 0  2. Type 2 diabetes mellitus without complication, without long-term current use of insulin  ([place]) Patient denies a personal or family history of pancreatitis, medullary thyroid carcinoma or multiple endocrine neoplasia type II. Recommend reviewing pen training video online.  Start - tirzepatide [facility]) 2.5 MG/0.5ML Pen; Inject 2.5 mg into the skin once a week.  Dispense: 2 mL; Refill: 0  3. Excessive vitamin B12 intake Hold B-12 supplement for one month. Plan to resume at 500 mcg after one month.   4. Obesity,current BMI 35.3 Smart fruit handout given.   Jessica Mendoza is currently in the action stage of change. As such, her goal is to continue with weight loss efforts. She has agreed to the Category 1 Plan + 100 protein daily.   Exercise goals:  [employer] app home exercises.   Behavioral modification strategies: increasing lean protein intake, increasing vegetables, increasing water intake, decreasing eating out, no skipping meals, meal planning and cooking strategies, keeping healthy foods in the home, and decreasing junk food.  Jessica Mendoza has agreed to follow-up with our clinic in 3 weeks. She was informed of the importance of frequent follow-up visits to maximize her success with intensive lifestyle modifications for her multiple health conditions.   Objective:   Blood pressure (!) 142/84, pulse 72, temperature 98 F (36.7 C), height '5\' 5"'$  (1.651 m), weight 212 lb (96.2 kg), SpO2 97 %. Body mass index is 35.28 kg/m.  General: Cooperative, alert, well developed, in no acute distress. HEENT: Conjunctivae and lids unremarkable. Cardiovascular: Regular rhythm.  Lungs: Normal work of breathing. Neurologic: No focal deficits.   Lab Results  Component Value Date   CREATININE  0.87 11/08/2021   BUN 9 11/08/2021   NA 140 11/08/2021   K 3.9 11/08/2021   CL 98 11/08/2021   CO2 26 11/08/2021   Lab Results  Component Value Date   ALT 19 11/08/2021   AST 14 11/08/2021   ALKPHOS 114 11/08/2021   BILITOT 0.6 11/08/2021   Lab Results  Component Value Date   HGBA1C 7.7 (H) 11/08/2021    HGBA1C 6.7 (H) 04/07/2013   HGBA1C 5.9 11/25/2012   HGBA1C 6.3 07/15/2012   HGBA1C 6.0 03/18/2012   Lab Results  Component Value Date   INSULIN 22.3 11/08/2021   Lab Results  Component Value Date   TSH 1.110 11/08/2021   Lab Results  Component Value Date   CHOL 155 05/31/2021   HDL 61 05/31/2021   LDLCALC 73 05/31/2021   TRIG 120 05/31/2021   CHOLHDL 2.5 05/31/2021   Lab Results  Component Value Date   VD25OH 33.8 11/08/2021   Lab Results  Component Value Date   WBC 7.2 11/08/2021   HGB 12.5 11/08/2021   HCT 37.9 11/08/2021   MCV 81 11/08/2021   PLT 234 11/08/2021   No results found for: "IRON", "TIBC", "FERRITIN"  Attestation Statements:   Reviewed by clinician on day of visit: allergies, medications, problem list, medical history, surgical history, family history, social history, and previous encounter notes.  I, Davy Pique, am acting as Location manager for Loyal Gambler, DO.  I have reviewed the above documentation for accuracy and completeness, and I agree with the above. Dell Ponto, DO

## 2021-12-04 NOTE — Progress Notes (Signed)
HEMATOLOGY-ONCOLOGY TELEPHONE VISIT PROGRESS NOTE  I connected with our patient on 12/06/21 at  2:00 PM EDT by telephone and verified that I am speaking with the correct person using two identifiers.  I discussed the limitations, risks, security and privacy concerns of performing an evaluation and management service by telephone and the availability of in person appointments.  I also discussed with the patient that there may be a patient responsible charge related to this service. The patient expressed understanding and agreed to proceed.   History of Present Illness: Jessica Mendoza is a 66 y.o. with above-mentioned history of right breast cancer treated with lumpectomy, radiation, and who is currently on antiestrogen therapy with anastrozole. She presents to the clinic via phone.  Oncology History  Breast cancer of upper-outer quadrant of right female breast ([place])  09/08/2014 Mammogram   Right breast: irregular mass with calcifications at 11:00, posterior depth.   09/15/2014 Breast US   Right breast: 1.6 cm irregular mass with indistinct margin at 11:00, posterior depth, 6 CFN. Related microcalcifications. Hypoechoic, correlates with mammographic findings.   09/16/2014 Initial Biopsy   Right breast biopsy: Invasive ductal carcinoma, grade 2, ER+ 100%, PR- 0%, HER-2 negative, Ki-67 10%   09/16/2014 Clinical Stage   Stage IA: T1c N0   09/23/2014 Procedure   Breast/Ovarian panel ([facility]) reveals no clinically signficant variant at ATM, BARD1, BRCA1, BRCA2, BRIP1, CDH1, CHEK2, EPCAM, FANCC, MLH1, MSH2, MSH6, NBN, PALB2, PMS2, PTEN, RAD51C, RAD51D, TP53, and XRCC2.    11/25/2014 Definitive Surgery   Right lumpectomy: IDC 1.7 cm Grade 2 with DCIS, 0/2 LN, ER+ 100, PR- 0%, HER 2 Neg    11/25/2014 Pathologic Stage   Stage IA: T1c N0   11/25/2014 Oncotype testing   Oncotype DX 18, 11% ROR   12/23/2014 - 01/28/2015 Radiation Therapy   Adjuvant RT: Right breast / 50 Gy in 25 fractions    03/02/2015 -  Anti-estrogen oral therapy   Anastrozole 1 mg daily 5 years   03/31/2015 Survivorship   Survivorship visit completed and copy of care plan given to patient     REVIEW OF SYSTEMS:   Constitutional: Denies fevers, chills or abnormal weight loss All other systems were reviewed with the patient and are negative. Observations/Objective:    Assessment Plan:  Breast cancer of upper-outer quadrant of right female breast ([place]) Rt  Lumpectomy 11/25/14: IDC 1.7 cm Grade 2 with DCIS , 0/2 LN, ER 100, PR 0%, Her 2 Neg T1CN0 (Stage 1A) Oncotype DX 18, 11% risk of recurrence Adjuvant radiation therapy 12/23/2014 to 01/28/2015   Current treatment: Adjuvant antiestrogen therapy with anastrozole 1 mg daily 7 years started 03/02/2015   Anastrozole toxicities: Denies any hot flashes or arthralgias or myalgias.   Survivorship: Encouraged her to continue to exercise and stay active.      Surveillance for breast cancer:  1. Breast exam 11/23/2021: Small nodule in the left chest wall area.  Could be lipoma versus something else.  Therefore we will obtain a CT chest for evaluation. 2. mammograms and ultrasound at [facility]: 11/22/2021: They have not been read yet. 3.  Bone density 11/09/2020: T score 0.5 (used to be 0.2): Normal   11/30/2021: CT chest: No suspicious nodules or masses.  1 cm left axillary lymph node probably benign.  Follow-up CT in 3 to 6 months.  Left thyroid nodules, left adrenal gland thickening  We will plan to obtain another CT chest in 6 months for follow-up.  Telephone visit after the 44-monthCT  scan to discuss results    I discussed the assessment and treatment plan with the patient. The patient was provided an opportunity to ask questions and all were answered. The patient agreed with the plan and demonstrated an understanding of the instructions. The patient was advised to call back or seek an in-person evaluation if the symptoms worsen or if the condition fails to  improve as anticipated.   I provided 12 minutes of non-face-to-face time during this encounter.  This includes time for charting and coordination of care   Harriette Ohara, MD  I Gardiner Coins am scribing for Dr. Lindi Adie  I have reviewed the above documentation for accuracy and completeness, and I agree with the above.

## 2021-12-06 ENCOUNTER — Inpatient Hospital Stay (HOSPITAL_BASED_OUTPATIENT_CLINIC_OR_DEPARTMENT_OTHER): Payer: Medicare Other | Admitting: Hematology and Oncology

## 2021-12-06 DIAGNOSIS — C50411 Malignant neoplasm of upper-outer quadrant of right female breast: Secondary | ICD-10-CM | POA: Diagnosis not present

## 2021-12-06 DIAGNOSIS — Z170 Estrogen receptor positive status [ER+]: Secondary | ICD-10-CM

## 2021-12-06 NOTE — Assessment & Plan Note (Signed)
RtLumpectomy 11/25/14: IDC 1.7 cm Grade 2 with DCIS , 0/2 LN, ER 100, PR 0%, Her 2 Neg T1CN0 (Stage 1A) Oncotype DX 18, 11% risk of recurrence Adjuvant radiation therapy 12/23/2014 to 01/28/2015  Current treatment: Adjuvant antiestrogen therapy with anastrozole 1 mg daily 7years started 03/02/2015  Anastrozole toxicities:Denies any hot flashes or arthralgias or myalgias.  Survivorship: Encouraged her to continue to exercise and stay active.  Surveillance for breast cancer:  1. Breast exam10/01/2022: Small nodule in the left chest wall area.  Could be lipoma versus something else.  Therefore we will obtain a CT chest for evaluation. 2. mammograms and ultrasound at [facility]: 11/22/2021: They have not been read yet. 3.Bone density9/28/2022: T score 0.5(used to be 0.2): Normal  11/30/2021: CT chest: No suspicious nodules or masses.  1 cm left axillary lymph node probably benign.  Follow-up CT in 3 to 6 months.  Left thyroid nodules, left adrenal gland thickening  We will plan to obtain another CT chest in 6 months for follow-up.  Telephone visit after the 3-monthCT scan to discuss results

## 2021-12-13 LAB — LIPID PANEL
Chol/HDL Ratio: 2.8 ratio (ref 0.0–4.4)
Cholesterol, Total: 145 mg/dL (ref 100–199)
HDL: 52 mg/dL (ref >39)
LDL Chol Calc (NIH): 76 mg/dL (ref 0–99)
Triglycerides: 91 mg/dL (ref 0–149)
VLDL Cholesterol Cal: 17 mg/dL (ref 5–40)

## 2021-12-14 ENCOUNTER — Encounter (INDEPENDENT_AMBULATORY_CARE_PROVIDER_SITE_OTHER): Payer: Self-pay | Admitting: Family Medicine

## 2021-12-14 ENCOUNTER — Ambulatory Visit (INDEPENDENT_AMBULATORY_CARE_PROVIDER_SITE_OTHER): Payer: Medicare Other | Admitting: Family Medicine

## 2021-12-14 VITALS — BP 138/83 | HR 67 | Temp 98.0°F | Ht 65.0 in | Wt 205.0 lb

## 2021-12-14 DIAGNOSIS — Z6834 Body mass index (BMI) 34.0-34.9, adult: Secondary | ICD-10-CM

## 2021-12-14 DIAGNOSIS — Z7985 Long-term (current) use of injectable non-insulin antidiabetic drugs: Secondary | ICD-10-CM

## 2021-12-14 DIAGNOSIS — E678 Other specified hyperalimentation: Secondary | ICD-10-CM | POA: Insufficient documentation

## 2021-12-14 DIAGNOSIS — E559 Vitamin D deficiency, unspecified: Secondary | ICD-10-CM | POA: Diagnosis not present

## 2021-12-14 DIAGNOSIS — E119 Type 2 diabetes mellitus without complications: Secondary | ICD-10-CM

## 2021-12-14 DIAGNOSIS — E669 Obesity, unspecified: Secondary | ICD-10-CM | POA: Diagnosis not present

## 2021-12-14 DIAGNOSIS — E66812 Obesity, class 2: Secondary | ICD-10-CM

## 2021-12-14 MED ORDER — TIRZEPATIDE 2.5 MG/0.5ML ~~LOC~~ SOAJ: 2.5000 mg | SUBCUTANEOUS | 0 refills | Status: DC

## 2021-12-14 MED ORDER — VITAMIN D (ERGOCALCIFEROL) 1.25 MG (50000 UNIT) PO CAPS: 50000.0000 [IU] | ORAL_CAPSULE | ORAL | 0 refills | Status: DC

## 2021-12-27 NOTE — Progress Notes (Signed)
Chief Complaint:   OBESITY [name] is here to discuss her progress with her obesity treatment plan along with follow-up of her obesity related diagnoses. Jessica Mendoza is on the Category 1 Plan and states she is following her eating plan approximately 85% of the time. Jessica Mendoza states she is walking 30 minutes 2 times per week.  Today's visit was #: 3 Starting weight: 214 lbs Starting date: 11/08/2021 Today's weight: 205 lbs Today's date: 12/14/2021 Total lbs lost to date: 9 lbs Total lbs lost since last in-office visit: 7 lbs  Interim History: She is doing great with meal planning and health food choices.  Enjoying satiety with Mounjaro.  Skips breakfast, but getting fruits and veggies and more consistent walking.  Denies cravings.   Subjective:   1. Type 2 diabetes mellitus without complication, without long-term current use of insulin ([facility]) AM fasting sugar down to 97.  Started Mounjaro 2.5 mg weekly, 3 weeks ago.  Helping with satiety.  Has occasional diarrhea.  Taking metformin XR 500 mg daily. Last A1c 7.7.   2. Excessive vitamin B12 intake She has been holding B12 supplement, level >2,000, 09/27.    3. Vitamin D deficiency On prescription Vitamin D 50,000 IU weekly.  Last Vitamin D level 33.8, 09/27.  Energy level improving.   Assessment/Plan:   1. Type 2 diabetes mellitus without complication, without long-term current use of insulin ([facility]) Refill - tirzepatide (MOUNJARO) 2.5 MG/0.5ML Pen; Inject 2.5 mg into the skin once a week.  Dispense: 2 mL; Refill: 0  2. Excessive vitamin B12 intake Recheck B12 level in December.   3. Vitamin D deficiency Recheck level in December.   Refill - Vitamin D, Ergocalciferol, (DRISDOL) 1.25 MG (50000 UNIT) CAPS capsule; Take 1 capsule (50,000 Units total) by mouth every 7 (seven) days.  Dispense: 5 capsule; Refill: 0  4. Obesity,current BMI 34.2 1) Reviewed [employer] results, she lost 6.4 lbs body fat and lost 2 lbs of muscle mass in one  month.   2) Can add back old fashioned oatmeal for breakfast.   Jessica Mendoza is currently in the action stage of change. As such, her goal is to continue with weight loss efforts. She has agreed to the Category 1 Plan.   Exercise goals:  [employer] exercises for weight training.  Increase walking 30 minutes 3 times per week.   Behavioral modification strategies: increasing lean protein intake, increasing vegetables, increasing water intake, increasing high fiber foods, decreasing eating out, no skipping meals, meal planning and cooking strategies, keeping healthy foods in the home, and decreasing junk food.  Jessica Mendoza has agreed to follow-up with our clinic in 3-4 weeks. She was informed of the importance of frequent follow-up visits to maximize her success with intensive lifestyle modifications for her multiple health conditions.   Objective:   Blood pressure 138/83, pulse 67, temperature 98 F (36.7 C), height '5\' 5"'$  (1.651 m), weight 205 lb (93 kg), SpO2 98 %. Body mass index is 34.11 kg/m.  General: Cooperative, alert, well developed, in no acute distress. HEENT: Conjunctivae and lids unremarkable. Cardiovascular: Regular rhythm.  Lungs: Normal work of breathing. Neurologic: No focal deficits.   Lab Results  Component Value Date   CREATININE 0.87 11/08/2021   BUN 9 11/08/2021   NA 140 11/08/2021   K 3.9 11/08/2021   CL 98 11/08/2021   CO2 26 11/08/2021   Lab Results  Component Value Date   ALT 19 11/08/2021   AST 14 11/08/2021   ALKPHOS 114 11/08/2021  BILITOT 0.6 11/08/2021   Lab Results  Component Value Date   HGBA1C 7.7 (H) 11/08/2021   HGBA1C 6.7 (H) 04/07/2013   HGBA1C 5.9 11/25/2012   HGBA1C 6.3 07/15/2012   HGBA1C 6.0 03/18/2012   Lab Results  Component Value Date   INSULIN 22.3 11/08/2021   Lab Results  Component Value Date   TSH 1.110 11/08/2021   Lab Results  Component Value Date   CHOL 145 12/13/2021   HDL 52 12/13/2021   LDLCALC 76 12/13/2021   TRIG 91  12/13/2021   CHOLHDL 2.8 12/13/2021   Lab Results  Component Value Date   VD25OH 33.8 11/08/2021   Lab Results  Component Value Date   WBC 7.2 11/08/2021   HGB 12.5 11/08/2021   HCT 37.9 11/08/2021   MCV 81 11/08/2021   PLT 234 11/08/2021   No results found for: "IRON", "TIBC", "FERRITIN"  Attestation Statements:   Reviewed by clinician on day of visit: allergies, medications, problem list, medical history, surgical history, family history, social history, and previous encounter notes.  I, Davy Pique, am acting as Location manager for Loyal Gambler, DO.  I have reviewed the above documentation for accuracy and completeness, and I agree with the above. Dell Ponto, DO

## 2021-12-30 LAB — COLOGUARD: COLOGUARD: NEGATIVE

## 2021-12-30 LAB — EXTERNAL GENERIC LAB PROCEDURE: COLOGUARD: NEGATIVE

## 2022-01-03 ENCOUNTER — Encounter: Payer: Self-pay | Admitting: Internal Medicine

## 2022-01-03 ENCOUNTER — Ambulatory Visit: Payer: Medicare Other | Attending: Internal Medicine | Admitting: Internal Medicine

## 2022-01-03 VITALS — BP 139/77 | HR 69 | Ht 65.0 in | Wt 206.0 lb

## 2022-01-03 DIAGNOSIS — E785 Hyperlipidemia, unspecified: Secondary | ICD-10-CM | POA: Diagnosis not present

## 2022-01-03 MED ORDER — ZYPITAMAG 2 MG PO TABS: 1.0000 | ORAL_TABLET | Freq: Every day | ORAL | 3 refills | Status: DC

## 2022-01-03 NOTE — Patient Instructions (Signed)
Medication Instructions:  NO CHANGES  3 month supply of Zyptimag sent to [facility]  *If you need a refill on your cardiac medications before your next appointment, please call your pharmacy*   Lab Work: Fasting Lipid Panel in 1 year  If you have labs (blood work) drawn today and your tests are completely normal, you will receive your results only by: [place] (if you have MyChart) OR A paper copy in the mail If you have any lab test that is abnormal or we need to change your treatment, we will call you to review the results.   Testing/Procedures: NONE   Follow-Up: At [facility], you and your health needs are our priority.  As part of our continuing mission to provide you with exceptional heart care, we have created designated Provider Care Teams.  These Care Teams include your primary Cardiologist (physician) and Advanced Practice Providers (APPs -  Physician Assistants and Nurse Practitioners) who all work together to provide you with the care you need, when you need it.  We recommend signing up for the patient portal called "MyChart".  Sign up information is provided on this After Visit Summary.  MyChart is used to connect with patients for Virtual Visits (Telemedicine).  Patients are able to view lab/test results, encounter notes, upcoming appointments, etc.  Non-urgent messages can be sent to your provider as well.   To learn more about what you can do with MyChart, go to [email or web address].    Your next appointment:    12 months with Dr. Debara Pickett

## 2022-01-03 NOTE — Progress Notes (Signed)
Virtual Visit via Video Note   Because of Jessica Mendoza co-morbid illnesses, she is at least at moderate risk for complications without adequate follow up.  This format is felt to be most appropriate for this patient at this time.  All issues noted in this document were discussed and addressed.  A limited physical exam was performed with this format.  Please refer to the patient's chart for her consent to telehealth for [facility].      Date:  01/03/2022   ID:  Jessica Mendoza, DOB 1955-05-14, MRN [id number] The patient was identified using 2 identifiers.  Evaluation Performed:  Follow-Up Visit  Patient Location:  [place] [id number]  Provider location:   [address], [place] [address]  PCP:  No primary care provider on file.  Cardiologist:  None Electrophysiologist:  None   Chief Complaint: No complaints  History of Present Illness:    Jessica Mendoza is a 66 y.o. female who presents via audio/video conferencing for a telehealth visit today.  Pleasant female kindly referred by Dr. Marisue Humble for evaluation management of dyslipidemia.  Past medical history significant for breast cancer, type 2 diabetes, struct of sleep apnea and paroxysmal atrial fibrillation managed by Dr. Caryl Comes.  No known history of coronary disease.  Unfortunately she has been statin intolerant.  Most recently her lipids showed total cholesterol 251 and LDL 164 only on red yeast rice.  She has been statin intolerant in the past having had myalgias.  She was able to tolerate Livalo, but her insurance stopped paying for the medication.  She thinks she was taking 2 mg either daily or every other day.   06/14/2021   Jessica Mendoza returns today for follow-up.  She is doing very well on symptomatic.  She gets this from [facility] and has tolerated it without any issues.  Cholesterol has come down nicely with total now 155, triglycerides 120, HDL 61 and LDL 73.   She is very pleased with the medicine overall.  01/03/2022  Jessica Mendoza is seen today for a virtual follow-up. She has done well with Zyptimag, which has been well-tolerated without significant side effects and has had no effect on her liver enzymes.  She has had good cholesterol control.  In fact her most recent lipids show further improvement.  Total cholesterol now 145, triglycerides 91 (down from 120), HDL 52 and LDL 76.  Prior CV studies:   The following studies were reviewed today:  Lab work  PMHx:  Past Medical History:  Diagnosis Date   Afib ([place])    Arthritis    WRISTS AND NECK   Back pain    Benign positional vertigo    Bloating    Breast cancer ([place]) 2016   ER+/PR-/Her2-   Colon polyps 12/2005   Diabetes mellitus, type 2 ([place]) 06/2010   Edema    Essential hypertension, benign    Family history of hypertrophic cardiomyopathy    Family history of ovarian cancer    Family history of pancreatic cancer    Focal nodular hyperplasia of liver    bx 2002   Glaucoma    Goiter 10/2006   Hot flashes    Hyperlipidemia    Hypothyroidism    Joint pain    Migraines    menstrual   Obesity    OSA (obstructive sleep apnea)    Other and unspecified hyperlipidemia    Sleep apnea     Past Surgical  History:  Procedure Laterality Date   BREAST LUMPECTOMY     right breast- benign   BREAST LUMPECTOMY WITH RADIOACTIVE SEED AND SENTINEL LYMPH NODE BIOPSY Right 11/25/2014   Procedure: RIGHT BREAST LUMPECTOMY WITH RADIOACTIVE SEED AND RIGHT AXILLARY SENTINEL LYMPH NODE BIOPSY;  Surgeon: Excell Seltzer, MD;  Location: [facility];  Service: General;  Laterality: Right;   CARDIOVERSION N/A 07/11/2012   Procedure: CARDIOVERSION;  Surgeon: Larey Dresser, MD;  Location: [facility] ENDOSCOPY;  Service: Cardiovascular;  Laterality: N/A;   CARDIOVERSION N/A 07/09/2019   Procedure: CARDIOVERSION;  Surgeon: Lelon Perla, MD;  Location: [facility] ENDOSCOPY;  Service: Cardiovascular;   Laterality: N/A;   LIVER BIOPSY  2002    FAMHx:  Family History  Problem Relation Age of Onset   Thyroid disease Mother    Hypertension Mother    Heart attack Mother    Alcohol abuse Father    Hypertension Brother    Hyperlipidemia Brother    Pancreatic cancer Maternal Grandfather 24   Hypertension Paternal Grandmother    Diabetes Paternal Grandmother    Ovarian cancer Maternal Aunt 56   Liver cancer Paternal Aunt 28       ETOH related   Breast cancer Cousin 55       Mother's paternal first cousin, had breast cancer again at 53   Ovarian cancer Cousin 67       "groin cancer"   Colon cancer Neg Hx    Stomach cancer Neg Hx    Rectal cancer Neg Hx    Esophageal cancer Neg Hx     SOCHx:   reports that she quit smoking about 33 years ago. Her smoking use included cigarettes. She has a 12.50 pack-year smoking history. She has never used smokeless tobacco. She reports current alcohol use. She reports that she does not use drugs.  ALLERGIES:  Allergies  Allergen Reactions   Crestor [Rosuvastatin]     Other reaction(s): myalgias (2015)   Fenofibrate Other (See Comments)    myalgias   Simvastatin     Myalgias  ?? lipitor    Statins Other (See Comments)    MEDS:  Current Meds  Medication Sig   Alpha Lipoic Acid 200 MG CAPS Take 200 mg by mouth daily before breakfast.   amLODipine (NORVASC) 5 MG tablet TAKE 1 TABLET BY MOUTH  DAILY   anastrozole (ARIMIDEX) 1 MG tablet TAKE 1 TABLET BY MOUTH  DAILY   Biotin 5000 MCG CAPS Take 5,000 mcg by mouth at bedtime.   celecoxib (CELEBREX) 200 MG capsule Take by mouth as needed.   Cholecalciferol (VITAMIN D-3) 1000 UNITS CAPS Take 1,000 Units by mouth at bedtime.    Coenzyme Q10 (CO Q 10) 100 MG CAPS Take 100 mg by mouth at bedtime.    COLLAGEN PO Take by mouth daily.   diclofenac Sodium (VOLTAREN) 1 % GEL SMARTSIG:1 sparingly Topical Every 8 Hours PRN   levothyroxine (SYNTHROID, LEVOTHROID) 25 MCG tablet Take 25 mcg by mouth daily  before breakfast.   metFORMIN (GLUCOPHAGE-XR) 500 MG 24 hr tablet Take 1 tablet (500 mg total) by mouth daily with breakfast. NO MORE REFILLS WITHOUT OFFICE VISIT - 2ND NOTICE   metoprolol tartrate (LOPRESSOR) 50 MG tablet Take 50 mg by mouth daily.   naftifine (NAFTIN) 1 % cream SMARTSIG:sparingly Topical Twice Daily   ONETOUCH VERIO test strip CHECK BS BID   oxybutynin (DITROPAN-XL) 10 MG 24 hr tablet Take 1 tablet by mouth daily.   potassium chloride SA (  KLOR-CON M) 20 MEQ tablet    rivaroxaban (XARELTO) 20 MG TABS tablet TAKE 1 TABLET BY MOUTH DAILY  WITH SUPPER   tirzepatide (MOUNJARO) 2.5 MG/0.5ML Pen Inject 2.5 mg into the skin once a week.   Vitamin D, Ergocalciferol, (DRISDOL) 1.25 MG (50000 UNIT) CAPS capsule Take 1 capsule (50,000 Units total) by mouth every 7 (seven) days.   [DISCONTINUED] oxybutynin (DITROPAN-XL) 10 MG 24 hr tablet Take 10 mg by mouth at bedtime.   [DISCONTINUED] Pitavastatin Magnesium (ZYPITAMAG) 2 MG TABS Take 1 tablet by mouth daily.     ROS: Pertinent items noted in HPI and remainder of comprehensive ROS otherwise negative.  Labs/Other Tests and Data Reviewed:    Recent Labs: 11/08/2021: ALT 19; BUN 9; Creatinine, Ser 0.87; Hemoglobin 12.5; Platelets 234; Potassium 3.9; Sodium 140; TSH 1.110   Recent Lipid Panel Lab Results  Component Value Date/Time   CHOL 145 12/13/2021 08:56 AM   TRIG 91 12/13/2021 08:56 AM   HDL 52 12/13/2021 08:56 AM   CHOLHDL 2.8 12/13/2021 08:56 AM   CHOLHDL 2.9 04/07/2013 10:58 AM   LDLCALC 76 12/13/2021 08:56 AM    Wt Readings from Last 3 Encounters:  01/03/22 206 lb (93.4 kg)  12/14/21 205 lb (93 kg)  11/23/21 213 lb (96.6 kg)     Exam:    Vital Signs:  BP 139/77   Pulse 69   Ht _0  (1.651 m)   Wt 206 lb (93.4 kg)   BMI 34.28 kg/m    General appearance: alert and no distress Lungs: No respiratory difficulty Abdomen: Moderately obese Extremities: extremities normal, atraumatic, no cyanosis or  edema Neurologic: Grossly normal  ASSESSMENT & PLAN:    Mixed dyslipidemia, goal LDL less than 70 Type 2 diabetes Moderate obesity PAF Obstructive sleep apnea History of breast cancer History of statin intolerance-myalgias  Overall, Jessica Mendoza is doing very well on symptomatic.  Her lipids are even further improved.  LDL in the low 70s.  Triglycerides are significantly lower.  I will continue current therapy will plan to see her back annually with a repeat lipid or sooner if necessary.  Patient Risk:   After full review of this patients clinical status, I feel that they are at least moderate risk at this time.  Time:   Today, I have spent 15 minutes with the patient with telehealth technology discussing dyslipidemia.     Medication Adjustments/Labs and Tests Ordered: Current medicines are reviewed at length with the patient today.  Concerns regarding medicines are outlined above.   Tests Ordered: Orders Placed This Encounter  Procedures   Lipid panel    Medication Changes: Meds ordered this encounter  Medications   Pitavastatin Magnesium (ZYPITAMAG) 2 MG TABS    Sig: Take 1 tablet by mouth daily.    Dispense:  90 tablet    Refill:  3    Disposition:  in 1 year(s)  Pixie Casino, MD, [facility], [place] Director of the Advanced Lipid Disorders &  Cardiovascular Risk Reduction Clinic Diplomate of the American Board of Clinical Lipidology Attending Cardiologist  Direct Dial: [phone]  Fax: [phone]  Website:  [email or web address]  Pixie Casino, MD  01/03/2022 9:21 AM

## 2022-01-10 ENCOUNTER — Encounter (INDEPENDENT_AMBULATORY_CARE_PROVIDER_SITE_OTHER): Payer: Self-pay | Admitting: Family Medicine

## 2022-01-10 ENCOUNTER — Ambulatory Visit (INDEPENDENT_AMBULATORY_CARE_PROVIDER_SITE_OTHER): Payer: Medicare Other | Admitting: Family Medicine

## 2022-01-10 VITALS — BP 120/79 | HR 96 | Temp 98.8°F | Ht 65.0 in | Wt 201.0 lb

## 2022-01-10 DIAGNOSIS — Z6833 Body mass index (BMI) 33.0-33.9, adult: Secondary | ICD-10-CM

## 2022-01-10 DIAGNOSIS — E559 Vitamin D deficiency, unspecified: Secondary | ICD-10-CM | POA: Diagnosis not present

## 2022-01-10 DIAGNOSIS — Z7985 Long-term (current) use of injectable non-insulin antidiabetic drugs: Secondary | ICD-10-CM

## 2022-01-10 DIAGNOSIS — E669 Obesity, unspecified: Secondary | ICD-10-CM

## 2022-01-10 DIAGNOSIS — E119 Type 2 diabetes mellitus without complications: Secondary | ICD-10-CM | POA: Diagnosis not present

## 2022-01-10 DIAGNOSIS — E66812 Obesity, class 2: Secondary | ICD-10-CM

## 2022-01-10 DIAGNOSIS — Z6835 Body mass index (BMI) 35.0-35.9, adult: Secondary | ICD-10-CM

## 2022-01-10 MED ORDER — TIRZEPATIDE 5 MG/0.5ML ~~LOC~~ SOAJ: 5.0000 mg | SUBCUTANEOUS | 0 refills | Status: DC

## 2022-01-10 MED ORDER — VITAMIN D (ERGOCALCIFEROL) 1.25 MG (50000 UNIT) PO CAPS: 50000.0000 [IU] | ORAL_CAPSULE | ORAL | 0 refills | Status: DC

## 2022-01-22 NOTE — Progress Notes (Signed)
Chief Complaint:   OBESITY Jessica Mendoza is here to discuss her progress with her obesity treatment plan along with follow-up of her obesity related diagnoses. Jessica Mendoza is on the Category 1 Plan and states she is following her eating plan approximately 40% of the time. Jessica Mendoza states she is walking 20-30 minutes to times per week.  Today's visit was #: 4 Starting weight: 35 LBS Starting date: 11/08/2021 Today's weight: 201 LBS Today's date: 01/10/2022 Total lbs lost to date: 13 LBS Total lbs lost since last in-office visit: 4 LBS  Interim History: Patient has reduced eating out to 3 times a week.  She is still struggling to plan and prepare meals.  She plans to prep better.  She has been mindful of portion sizes.  She tried a protein shake for breakfast.  She is taking better snacks.  She has joined a gym.  She is starting with a personal trainer 2 times a week.  She will go to the gym on her own 1 time a week.  Subjective:   1. Type 2 diabetes mellitus without complication, without long-term current use of insulin (HCC) AM fastings running in the 90s.  She is on Mounjaro 2.5 mg weekly with mild constipation but has no other side effects.  She is also on metformin XR 500 mg taken 3 days/week.  Her last A1c was 7.7 on 11/08/2021.  2. Vitamin D deficiency She is currently taking prescription vitamin D 50,000 IU each week. She denies nausea, vomiting or muscle weakness.  Her last vitamin D level was 33.8.  Her energy level is improving.  Assessment/Plan:   1. Type 2 diabetes mellitus without complication, without long-term current use of insulin (HCC) Recheck A1c at next office visit, with B12, BMP.  Increase Mounjaro to 5 mg weekly.  Refill - tirzepatide (MOUNJARO) 5 MG/0.5ML Pen; Inject 5 mg into the skin once a week.  Dispense: 2 mL; Refill: 0  2. Vitamin D deficiency Recheck vitamin D level next visit.  Refill- Vitamin D, Ergocalciferol, (DRISDOL) 1.25 MG (50000 UNIT) CAPS capsule;  Take 1 capsule (50,000 Units total) by mouth every 7 (seven) days.  Dispense: 5 capsule; Refill: 0  3. Obesity,current BMI 33.5 Add an additional day of walking or gym each week.  Jessica Mendoza is currently in the action stage of change. As such, her goal is to continue with weight loss efforts. She has agreed to the Category 1 Plan.   Exercise goals:  Increase walks to 3 times a week  Behavioral modification strategies: increasing lean protein intake, increasing vegetables, increasing water intake, decreasing eating out, no skipping meals, meal planning and cooking strategies, keeping healthy foods in the home, holiday eating strategies , and celebration eating strategies.  Jessica Mendoza has agreed to follow-up with our clinic in 4 weeks. She was informed of the importance of frequent follow-up visits to maximize her success with intensive lifestyle modifications for her multiple health conditions.   Objective:   Blood pressure 120/79, pulse 96, temperature 98.8 F (37.1 C), height '5\' 5"'$  (1.651 m), weight 201 lb (91.2 kg), SpO2 98 %. Body mass index is 33.45 kg/m.  General: Cooperative, alert, well developed, in no acute distress. HEENT: Conjunctivae and lids unremarkable. Cardiovascular: Regular rhythm.  Lungs: Normal work of breathing. Neurologic: No focal deficits.   Lab Results  Component Value Date   CREATININE 0.87 11/08/2021   BUN 9 11/08/2021   NA 140 11/08/2021   K 3.9 11/08/2021   CL 98 11/08/2021  CO2 26 11/08/2021   Lab Results  Component Value Date   ALT 19 11/08/2021   AST 14 11/08/2021   ALKPHOS 114 11/08/2021   BILITOT 0.6 11/08/2021   Lab Results  Component Value Date   HGBA1C 7.7 (H) 11/08/2021   HGBA1C 6.7 (H) 04/07/2013   HGBA1C 5.9 11/25/2012   HGBA1C 6.3 07/15/2012   HGBA1C 6.0 03/18/2012   Lab Results  Component Value Date   INSULIN 22.3 11/08/2021   Lab Results  Component Value Date   TSH 1.110 11/08/2021   Lab Results  Component Value Date    CHOL 145 12/13/2021   HDL 52 12/13/2021   LDLCALC 76 12/13/2021   TRIG 91 12/13/2021   CHOLHDL 2.8 12/13/2021   Lab Results  Component Value Date   VD25OH 33.8 11/08/2021   Lab Results  Component Value Date   WBC 7.2 11/08/2021   HGB 12.5 11/08/2021   HCT 37.9 11/08/2021   MCV 81 11/08/2021   PLT 234 11/08/2021   No results found for: "IRON", "TIBC", "FERRITIN"  Attestation Statements:   Reviewed by clinician on day of visit: allergies, medications, problem list, medical history, surgical history, family history, social history, and previous encounter notes.  I, Davy Pique, am acting as Location manager for Loyal Gambler, DO.  I have reviewed the above documentation for accuracy and completeness, and I agree with the above. Dell Ponto, DO

## 2022-02-07 ENCOUNTER — Other Ambulatory Visit: Payer: Self-pay | Admitting: Internal Medicine

## 2022-02-07 DIAGNOSIS — I480 Paroxysmal atrial fibrillation: Secondary | ICD-10-CM

## 2022-02-07 NOTE — Telephone Encounter (Signed)
Prescription refill request for Xarelto received.  Indication: Afib  Last office visit: 01/03/22 ([place])  Weight: 91.2kg Age: 66 Scr: 0.87 (11/08/21)  CrCl: 90.34m/min  Appropriate dose. Refill sent to requested pharmacy.

## 2022-02-14 ENCOUNTER — Encounter (INDEPENDENT_AMBULATORY_CARE_PROVIDER_SITE_OTHER): Payer: Self-pay | Admitting: Family Medicine

## 2022-02-14 ENCOUNTER — Ambulatory Visit (INDEPENDENT_AMBULATORY_CARE_PROVIDER_SITE_OTHER): Payer: Medicare Other | Admitting: Family Medicine

## 2022-02-14 VITALS — BP 138/82 | HR 81 | Temp 98.1°F | Ht 65.0 in | Wt 206.0 lb

## 2022-02-14 DIAGNOSIS — E559 Vitamin D deficiency, unspecified: Secondary | ICD-10-CM | POA: Diagnosis not present

## 2022-02-14 DIAGNOSIS — Z6834 Body mass index (BMI) 34.0-34.9, adult: Secondary | ICD-10-CM | POA: Diagnosis not present

## 2022-02-14 DIAGNOSIS — Z7985 Long-term (current) use of injectable non-insulin antidiabetic drugs: Secondary | ICD-10-CM

## 2022-02-14 DIAGNOSIS — E119 Type 2 diabetes mellitus without complications: Secondary | ICD-10-CM

## 2022-02-14 DIAGNOSIS — E669 Obesity, unspecified: Secondary | ICD-10-CM

## 2022-02-14 DIAGNOSIS — E66812 Obesity, class 2: Secondary | ICD-10-CM

## 2022-02-14 MED ORDER — TIRZEPATIDE 5 MG/0.5ML ~~LOC~~ SOAJ: 5.0000 mg | SUBCUTANEOUS | 0 refills | Status: DC

## 2022-02-14 MED ORDER — VITAMIN D (ERGOCALCIFEROL) 1.25 MG (50000 UNIT) PO CAPS: 50000.0000 [IU] | ORAL_CAPSULE | ORAL | 0 refills | Status: DC

## 2022-02-15 LAB — COMPREHENSIVE METABOLIC PANEL
ALT: 18 IU/L (ref 0–32)
AST: 15 IU/L (ref 0–40)
Albumin/Globulin Ratio: 1.7 (ref 1.2–2.2)
Albumin: 4.8 g/dL (ref 3.9–4.9)
Alkaline Phosphatase: 98 IU/L (ref 44–121)
BUN/Creatinine Ratio: 14 (ref 12–28)
BUN: 13 mg/dL (ref 8–27)
Bilirubin Total: 0.4 mg/dL (ref 0.0–1.2)
CO2: 25 mmol/L (ref 20–29)
Calcium: 9.3 mg/dL (ref 8.7–10.3)
Chloride: 100 mmol/L (ref 96–106)
Creatinine, Ser: 0.92 mg/dL (ref 0.57–1.00)
Globulin, Total: 2.9 g/dL (ref 1.5–4.5)
Glucose: 86 mg/dL (ref 70–99)
Potassium: 3.6 mmol/L (ref 3.5–5.2)
Sodium: 140 mmol/L (ref 134–144)
Total Protein: 7.7 g/dL (ref 6.0–8.5)
eGFR: 68 mL/min/{1.73_m2} (ref >59)

## 2022-02-15 LAB — VITAMIN B12: Vitamin B-12: 1265 pg/mL — ABNORMAL HIGH (ref 232–1245)

## 2022-02-15 LAB — VITAMIN D 25 HYDROXY (VIT D DEFICIENCY, FRACTURES): Vit D, 25-Hydroxy: 42.2 ng/mL (ref 30.0–100.0)

## 2022-02-15 LAB — HEMOGLOBIN A1C
Est. average glucose Bld gHb Est-mCnc: 128 mg/dL
Hgb A1c MFr Bld: 6.1 % — ABNORMAL HIGH (ref 4.8–5.6)

## 2022-02-19 NOTE — Progress Notes (Signed)
Chief Complaint:   OBESITY Jessica Mendoza is here to discuss her progress with her obesity treatment plan along with follow-up of her obesity related diagnoses. Jessica Mendoza is on the Category 1 Plan and states she is following her eating plan approximately 25% of the time. Jessica Mendoza states she is going to the gym seeing a trainer 30 minutes to times per week.  Today's visit was #: 5 Starting weight: 214 LBS Starting date: 11/08/2021 Today's weight: 206 LBS Today's date: 02/14/2022 Total lbs lost to date: 8 LBS Total lbs lost since last in-office visit: +5 LBS  Interim History: Patient had travel since her last visit and was eating out more.  Ready to get back on track.  Started working out with a Clinical research associate 2 times per week, mostly resistance training.  Plans to walk or use elliptical 2 times a week.  Patient denies chest pain or DOE.  Subjective:    1. Type 2 diabetes mellitus without complication, without long-term current use of insulin (HCC) Mounjaro dose was increased to 5 mg 4 weeks ago.  Patient denies GI upset.  Patient denies constipation.  Patient is taking metformin XR 500 mg only 3 days/week.  2. Vitamin D deficiency Vitamin D level 33.8 on 11/08/2021.  Patient is taking prescription vitamin D 50,000 IU weekly. Energy level is improving.  Assessment/Plan:   1. Type 2 diabetes mellitus without complication, without long-term current use of insulin (HCC) Metformin XR 500 mg move to daily.  Check labs today.  Refill- tirzepatide (MOUNJARO) 5 MG/0.5ML Pen; Inject 5 mg into the skin once a week.  Dispense: 2 mL; Refill: 0  - Vitamin B12 - Comprehensive metabolic panel - Hemoglobin A1c  2. Vitamin D deficiency Check labs today.  Refill- Vitamin D, Ergocalciferol, (DRISDOL) 1.25 MG (50000 UNIT) CAPS capsule; Take 1 capsule (50,000 Units total) by mouth every 7 (seven) days.  Dispense: 5 capsule; Refill: 0  - VITAMIN D 25 Hydroxy (Vit-D Deficiency, Fractures)  3. Obesity,current BMI  34.4 1.  Resume meal plan. 2.  Add cardio 2 days/week. 3.  Reviewed high-protein snacks.  Jessica Mendoza is currently in the action stage of change. As such, her goal is to continue with weight loss efforts. She has agreed to the Category 1 Plan.   Exercise goals:  Gym and elliptical 4 times per week  Behavioral modification strategies: increasing lean protein intake, increasing vegetables, increasing water intake, decreasing eating out, no skipping meals, meal planning and cooking strategies, keeping healthy foods in the home, and planning for success.  Jessica Mendoza has agreed to follow-up with our clinic in 4 weeks. She was informed of the importance of frequent follow-up visits to maximize her success with intensive lifestyle modifications for her multiple health conditions.   Objective:   Blood pressure 138/82, pulse 81, temperature 98.1 F (36.7 C), height '5\' 5"'$  (1.651 m), weight 206 lb (93.4 kg), SpO2 99 %. Body mass index is 34.28 kg/m.  General: Cooperative, alert, well developed, in no acute distress. HEENT: Conjunctivae and lids unremarkable. Cardiovascular: Regular rhythm.  Lungs: Normal work of breathing. Neurologic: No focal deficits.   Lab Results  Component Value Date   CREATININE 0.92 02/14/2022   BUN 13 02/14/2022   NA 140 02/14/2022   K 3.6 02/14/2022   CL 100 02/14/2022   CO2 25 02/14/2022   Lab Results  Component Value Date   ALT 18 02/14/2022   AST 15 02/14/2022   ALKPHOS 98 02/14/2022   BILITOT 0.4 02/14/2022  Lab Results  Component Value Date   HGBA1C 6.1 (H) 02/14/2022   HGBA1C 7.7 (H) 11/08/2021   HGBA1C 6.7 (H) 04/07/2013   HGBA1C 5.9 11/25/2012   HGBA1C 6.3 07/15/2012   Lab Results  Component Value Date   INSULIN 22.3 11/08/2021   Lab Results  Component Value Date   TSH 1.110 11/08/2021   Lab Results  Component Value Date   CHOL 145 12/13/2021   HDL 52 12/13/2021   LDLCALC 76 12/13/2021   TRIG 91 12/13/2021   CHOLHDL 2.8 12/13/2021   Lab  Results  Component Value Date   VD25OH 42.2 02/14/2022   VD25OH 33.8 11/08/2021   Lab Results  Component Value Date   WBC 7.2 11/08/2021   HGB 12.5 11/08/2021   HCT 37.9 11/08/2021   MCV 81 11/08/2021   PLT 234 11/08/2021   No results found for: "IRON", "TIBC", "FERRITIN"  Attestation Statements:   Reviewed by clinician on day of visit: allergies, medications, problem list, medical history, surgical history, family history, social history, and previous encounter notes.  I, Davy Pique, am acting as Location manager for Loyal Gambler, DO.  I have reviewed the above documentation for accuracy and completeness, and I agree with the above. Dell Ponto, DO

## 2022-03-07 ENCOUNTER — Ambulatory Visit: Payer: Medicare Other | Admitting: Podiatry

## 2022-03-13 NOTE — Progress Notes (Signed)
TeleHealth Visit:  This visit was completed with telemedicine (audio/video) technology. Jessica Mendoza has verbally consented to this TeleHealth visit. The patient is located at home, the provider is located at home. The participants in this visit include the listed provider and patient. The visit was conducted today via MyChart video.  OBESITY Jessica Mendoza is here to discuss her progress with her obesity treatment plan along with follow-up of her obesity related diagnoses.   Today's visit was # 6 Starting weight: 214 lbs Starting date: 11/08/2021 Weight at last in office visit: 206 lbs on 02/14/22 Total weight loss: 8 lbs at last in office visit on 02/14/22. Today's reported weight: 201 lbs   Nutrition Plan: the Category 1 Plan.   Current exercise: cardio/resistance twice weekly with trainer    Interim History:  Jessica Mendoza reports better adherence to the category 1 plan.  She had COVID recently but is now recovered and has started back exercise.  She is working with a Clinical research associate twice weekly at [employer]. She struggles with getting the protein in.  She likes vegetables better than meat.  She is trying to use beans rather than meat sometimes.  She tends to have her large meal at lunch and her smaller meal at dinner. Appetite well-controlled.  On Mounjaro 5 mg weekly.  Assessment/Plan:  We discussed recent lab results in depth.  1. Type II Diabetes Her A1c reduced from 7.7 in September to 6.1.  She is thrilled with the A1c reduction. Medication(s): Metformin XR 500 mg daily, Mounjaro 5 mg weekly.  Appetite well-controlled.  Denies GI side effects.  Lab Results  Component Value Date   HGBA1C 6.1 (H) 02/14/2022   HGBA1C 7.7 (H) 11/08/2021   HGBA1C 6.7 (H) 04/07/2013   Lab Results  Component Value Date   MICROALBUR 0.50 04/07/2013   LDLCALC 76 12/13/2021   CREATININE 0.92 02/14/2022    Plan: Continue metformin XR 500 mg daily with breakfast. Refill Mounjaro 5 mg weekly.   2. Vitamin D  Deficiency Vitamin D is not yet at goal but has improved.  Last vitamin D was 42 on 02/14/2022. She is on weekly prescription Vitamin D 50,000 IU.  Lab Results  Component Value Date   VD25OH 42.2 02/14/2022   VD25OH 33.8 11/08/2021    Plan: Continue and refill prescription vitamin D 50,000 IU weekly.   3. Obesity: Current BMI 35 Jessica Mendoza is currently in the action stage of change. As such, her goal is to continue with weight loss efforts.  She has agreed to the Category 1 Plan.   Encouraged her to have meat for protein at lunch and dinner. She may have 1/2 cup of sweet potato daily rather than 2 slices of bread. Lunch option handout sent via MyChart.  Exercise goals: Continue with trainer 2 days/week.  Add 1 more day at the gym weekly.  Behavioral modification strategies: increasing lean protein intake, meal planning and cooking strategies, and planning for success.  Jessica Mendoza has agreed to follow-up with our clinic in 3 weeks.   No orders of the defined types were placed in this encounter.   Medications Discontinued During This Encounter  Medication Reason   tirzepatide Darcel Bayley) 5 MG/0.5ML Pen Reorder     Meds ordered this encounter  Medications   tirzepatide (MOUNJARO) 5 MG/0.5ML Pen    Sig: Inject 5 mg into the skin once a week.    Dispense:  2 mL    Refill:  0    Order Specific Question:   Supervising Provider  Answer:   Dell Ponto [[id number]]      Objective:   VITALS: Per patient if applicable, see vitals. GENERAL: Alert and in no acute distress. CARDIOPULMONARY: No increased WOB. Speaking in clear sentences.  PSYCH: Pleasant and cooperative. Speech normal rate and rhythm. Affect is appropriate. Insight and judgement are appropriate. Attention is focused, linear, and appropriate.  NEURO: Oriented as arrived to appointment on time with no prompting.   Lab Results  Component Value Date   CREATININE 0.92 02/14/2022   BUN 13 02/14/2022   NA 140 02/14/2022   K  3.6 02/14/2022   CL 100 02/14/2022   CO2 25 02/14/2022   Lab Results  Component Value Date   ALT 18 02/14/2022   AST 15 02/14/2022   ALKPHOS 98 02/14/2022   BILITOT 0.4 02/14/2022   Lab Results  Component Value Date   HGBA1C 6.1 (H) 02/14/2022   HGBA1C 7.7 (H) 11/08/2021   HGBA1C 6.7 (H) 04/07/2013   HGBA1C 5.9 11/25/2012   HGBA1C 6.3 07/15/2012   Lab Results  Component Value Date   INSULIN 22.3 11/08/2021   Lab Results  Component Value Date   TSH 1.110 11/08/2021   Lab Results  Component Value Date   CHOL 145 12/13/2021   HDL 52 12/13/2021   LDLCALC 76 12/13/2021   TRIG 91 12/13/2021   CHOLHDL 2.8 12/13/2021   Lab Results  Component Value Date   WBC 7.2 11/08/2021   HGB 12.5 11/08/2021   HCT 37.9 11/08/2021   MCV 81 11/08/2021   PLT 234 11/08/2021   No results found for: "IRON", "TIBC", "FERRITIN" Lab Results  Component Value Date   VD25OH 42.2 02/14/2022   VD25OH 33.8 11/08/2021    Attestation Statements:   Reviewed by clinician on day of visit: allergies, medications, problem list, medical history, surgical history, family history, social history, and previous encounter notes.

## 2022-03-14 ENCOUNTER — Encounter (INDEPENDENT_AMBULATORY_CARE_PROVIDER_SITE_OTHER): Payer: Self-pay | Admitting: Family Medicine

## 2022-03-14 ENCOUNTER — Telehealth (INDEPENDENT_AMBULATORY_CARE_PROVIDER_SITE_OTHER): Payer: Medicare Other | Admitting: Family Medicine

## 2022-03-14 DIAGNOSIS — Z7984 Long term (current) use of oral hypoglycemic drugs: Secondary | ICD-10-CM

## 2022-03-14 DIAGNOSIS — Z7985 Long-term (current) use of injectable non-insulin antidiabetic drugs: Secondary | ICD-10-CM

## 2022-03-14 DIAGNOSIS — Z6835 Body mass index (BMI) 35.0-35.9, adult: Secondary | ICD-10-CM

## 2022-03-14 DIAGNOSIS — E559 Vitamin D deficiency, unspecified: Secondary | ICD-10-CM

## 2022-03-14 DIAGNOSIS — E669 Obesity, unspecified: Secondary | ICD-10-CM

## 2022-03-14 DIAGNOSIS — E119 Type 2 diabetes mellitus without complications: Secondary | ICD-10-CM

## 2022-03-14 DIAGNOSIS — Z6834 Body mass index (BMI) 34.0-34.9, adult: Secondary | ICD-10-CM

## 2022-03-14 MED ORDER — TIRZEPATIDE 5 MG/0.5ML ~~LOC~~ SOAJ: 5.0000 mg | SUBCUTANEOUS | 0 refills | Status: DC

## 2022-03-18 ENCOUNTER — Other Ambulatory Visit (INDEPENDENT_AMBULATORY_CARE_PROVIDER_SITE_OTHER): Payer: Self-pay | Admitting: Family Medicine

## 2022-03-18 DIAGNOSIS — E559 Vitamin D deficiency, unspecified: Secondary | ICD-10-CM

## 2022-03-21 ENCOUNTER — Ambulatory Visit (INDEPENDENT_AMBULATORY_CARE_PROVIDER_SITE_OTHER): Payer: Medicare Other | Admitting: Podiatry

## 2022-03-21 VITALS — BP 144/75

## 2022-03-21 DIAGNOSIS — M79674 Pain in right toe(s): Secondary | ICD-10-CM | POA: Diagnosis not present

## 2022-03-21 DIAGNOSIS — E119 Type 2 diabetes mellitus without complications: Secondary | ICD-10-CM | POA: Diagnosis not present

## 2022-03-21 DIAGNOSIS — M79675 Pain in left toe(s): Secondary | ICD-10-CM

## 2022-03-21 DIAGNOSIS — B351 Tinea unguium: Secondary | ICD-10-CM

## 2022-03-21 NOTE — Progress Notes (Signed)
   Chief Complaint  Patient presents with   Nail Problem    Diabetic foot care [id number] A1C-6.1 PCP-Merrino PCP VST-02/2022    SUBJECTIVE Patient with a history of diabetes mellitus presents to office today complaining of elongated, thickened nails that cause pain while ambulating in shoes.  Patient is unable to trim their own nails. Patient is here for further evaluation and treatment.  Past Medical History:  Diagnosis Date   Afib ([place])    Arthritis    WRISTS AND NECK   Back pain    Benign positional vertigo    Bloating    Breast cancer ([place]) 2016   ER+/PR-/Her2-   Colon polyps 12/2005   Diabetes mellitus, type 2 ([place]) 06/2010   Edema    Essential hypertension, benign    Family history of hypertrophic cardiomyopathy    Family history of ovarian cancer    Family history of pancreatic cancer    Focal nodular hyperplasia of liver    bx 2002   Glaucoma    Goiter 10/2006   Hot flashes    Hyperlipidemia    Hypothyroidism    Joint pain    Migraines    menstrual   Obesity    OSA (obstructive sleep apnea)    Other and unspecified hyperlipidemia    Sleep apnea     Allergies  Allergen Reactions   Crestor [Rosuvastatin]     Other reaction(s): myalgias (2015)   Fenofibrate Other (See Comments)    myalgias   Simvastatin     Myalgias  ?? lipitor    Statins Other (See Comments)     OBJECTIVE General Patient is awake, alert, and oriented x 3 and in no acute distress. Derm Skin is dry and supple bilateral. Negative open lesions or macerations. Remaining integument unremarkable. Nails are tender, long, thickened and dystrophic with subungual debris, consistent with onychomycosis, 1-5 bilateral. No signs of infection noted. Vasc  DP and PT pedal pulses palpable bilaterally. Temperature gradient within normal limits.  Neuro Epicritic and protective threshold sensation diminished bilaterally.  Musculoskeletal Exam No symptomatic pedal deformities noted bilateral. Muscular  strength within normal limits.  ASSESSMENT 1. Diabetes Mellitus w/ peripheral neuropathy 2.  Pain due to onychomycosis of toenails bilateral  PLAN OF CARE 1. Patient evaluated today.  Comprehensive diabetic foot exam performed today 2. Instructed to maintain good pedal hygiene and foot care. Stressed importance of controlling blood sugar.  3. Mechanical debridement of nails 1-5 bilaterally performed using a nail nipper. Filed with dremel without incident.  4. Return to clinic in 3 mos.     Edrick Kins, DPM [facility]  Dr. Edrick Kins, DPM    [address], [place] [address]                Office [phone]  Fax [phone]

## 2022-03-22 ENCOUNTER — Encounter (HOSPITAL_COMMUNITY): Payer: Self-pay | Admitting: *Deleted

## 2022-04-04 ENCOUNTER — Ambulatory Visit (INDEPENDENT_AMBULATORY_CARE_PROVIDER_SITE_OTHER): Payer: Medicare Other | Admitting: Family Medicine

## 2022-04-04 ENCOUNTER — Encounter (INDEPENDENT_AMBULATORY_CARE_PROVIDER_SITE_OTHER): Payer: Self-pay | Admitting: Family Medicine

## 2022-04-04 VITALS — BP 133/79 | HR 75 | Temp 98.0°F | Ht 65.0 in | Wt 201.0 lb

## 2022-04-04 DIAGNOSIS — E66812 Obesity, class 2: Secondary | ICD-10-CM

## 2022-04-04 DIAGNOSIS — Z7984 Long term (current) use of oral hypoglycemic drugs: Secondary | ICD-10-CM

## 2022-04-04 DIAGNOSIS — E559 Vitamin D deficiency, unspecified: Secondary | ICD-10-CM | POA: Diagnosis not present

## 2022-04-04 DIAGNOSIS — E119 Type 2 diabetes mellitus without complications: Secondary | ICD-10-CM | POA: Diagnosis not present

## 2022-04-04 DIAGNOSIS — Z6833 Body mass index (BMI) 33.0-33.9, adult: Secondary | ICD-10-CM | POA: Diagnosis not present

## 2022-04-04 DIAGNOSIS — Z7985 Long-term (current) use of injectable non-insulin antidiabetic drugs: Secondary | ICD-10-CM

## 2022-04-04 MED ORDER — VITAMIN D (ERGOCALCIFEROL) 1.25 MG (50000 UNIT) PO CAPS: 50000.0000 [IU] | ORAL_CAPSULE | ORAL | 0 refills | Status: DC

## 2022-04-04 MED ORDER — TIRZEPATIDE 5 MG/0.5ML ~~LOC~~ SOAJ: 5.0000 mg | SUBCUTANEOUS | 0 refills | Status: DC

## 2022-04-04 NOTE — Assessment & Plan Note (Signed)
Vitamin D level has improved to 42 Reviewed labs February 14, 2022. Energy level is improving on vitamin D Rx 50,000 IU once weekly  Recheck level in 3 months

## 2022-04-04 NOTE — Assessment & Plan Note (Signed)
Reviewed most recent labs including A1c reduction to 6.1 from 7.7 Doing well on Mounjaro 5 mg once weekly injection without GI side effects She is taking metformin XR 500 mg once daily Enjoying the added satiety She has reduced her intake of added sugar and followed her prescribed meal plan. She has added in resistance training 2 days a week.  Plan to continue prescribed meal plan Increase exercise by adding in 30 minutes of walking 2 days a week Refilled Mounjaro at 5 mg once weekly injection

## 2022-04-04 NOTE — Progress Notes (Signed)
Office: [phone]  /  Fax: [phone]  WEIGHT SUMMARY AND BIOMETRICS  Medical Weight Loss Height: 5' 5"$  (1.651 m) Weight: 201 lb (91.2 kg) Temp: 98 F (36.7 C) Pulse Rate: 75 BP: 133/79 SpO2: 100 % Fasting: no Labs: no Today's Visit #: 7 Weight at Last VIsit: 206lb Weight Lost Since Last Visit: 5lb  Body Fat %: 45 % Fat Mass (lbs): 90.8 lbs Muscle Mass (lbs): 105.2 lbs Total Body Water (lbs): 80 lbs Visceral Fat Rating : 13 Starting Date: 11/08/21 Starting Weight: 214lb Total Weight Loss (lbs): 13 lb (5.897 kg)    HPI  Chief Complaint: OBESITY  Jessica Mendoza is here to discuss her progress with her obesity treatment plan. She is on the the Category 1 Plan and states she is following her eating plan approximately 50 % of the time. She states she is exercising 30 minutes 3 times per week.   Interval History:  Since last office visit she down 5 lb  Net weight loss is 13 lb in 4 mos.  This is a 6% TBW loss. Denies meal skipping on Mounjaro.  She is sticking her her meal plan.  Added in some greek yogurt to help her get more protein in since she cut back on meat intake.  She is eating some beans and has added in some sweet potato in place of bread.  She is working out with a Clinical research associate 2 days a week.  Denies hunger or cravings   Pharmacotherapy: Mounjaro  PHYSICAL EXAM:  Blood pressure 133/79, pulse 75, temperature 98 F (36.7 C), height 5' 5"$  (1.651 m), weight 201 lb (91.2 kg), SpO2 100 %. Body mass index is 33.45 kg/m.  General: She is overweight, cooperative, alert, well developed, and in no acute distress. PSYCH: Has normal mood, affect and thought process.   HEENT: EOMI, sclerae are anicteric. Lungs: Normal breathing effort, no conversational dyspnea. Extremities: No edema.  Neurologic: No gross sensory or motor deficits. No tremors or fasciculations noted.    DIAGNOSTIC DATA REVIEWED:  BMET    Component Value Date/Time   NA 140 02/14/2022 1453   NA 140  09/22/2014 1248   K 3.6 02/14/2022 1453   K 3.6 09/22/2014 1248   CL 100 02/14/2022 1453   CO2 25 02/14/2022 1453   CO2 29 09/22/2014 1248   GLUCOSE 86 02/14/2022 1453   GLUCOSE 80 07/09/2019 0939   GLUCOSE 116 09/22/2014 1248   BUN 13 02/14/2022 1453   BUN 11.9 09/22/2014 1248   CREATININE 0.92 02/14/2022 1453   CREATININE 1.1 09/22/2014 1248   CALCIUM 9.3 02/14/2022 1453   CALCIUM 9.3 09/22/2014 1248   GFRNONAA 71 06/24/2019 1431   GFRNONAA 83 04/07/2013 1058   GFRAA 81 06/24/2019 1431   GFRAA >89 04/07/2013 1058   Lab Results  Component Value Date   HGBA1C 6.1 (H) 02/14/2022   HGBA1C 6.7 (A) 01/09/2011   Lab Results  Component Value Date   INSULIN 22.3 11/08/2021   Lab Results  Component Value Date   TSH 1.110 11/08/2021   CBC    Component Value Date/Time   WBC 7.2 11/08/2021 0909   WBC 5.5 09/22/2014 1248   WBC 5.8 07/08/2012 1500   RBC 4.67 11/08/2021 0909   RBC 4.67 09/22/2014 1248   RBC 4.61 07/08/2012 1500   HGB 12.5 11/08/2021 0909   HGB 12.8 09/22/2014 1248   HCT 37.9 11/08/2021 0909   HCT 39.1 09/22/2014 1248   PLT 234 11/08/2021 0909   MCV 81  11/08/2021 0909   MCV 83.9 09/22/2014 1248   MCH 26.8 11/08/2021 0909   MCH 27.4 09/22/2014 1248   MCH 26.8 03/18/2012 1546   MCHC 33.0 11/08/2021 0909   MCHC 32.6 09/22/2014 1248   MCHC 33.1 07/08/2012 1500   RDW 14.9 11/08/2021 0909   RDW 15.4 (H) 09/22/2014 1248   Iron Studies No results found for: "IRON", "TIBC", "FERRITIN", "IRONPCTSAT" Lipid Panel     Component Value Date/Time   CHOL 145 12/13/2021 0856   TRIG 91 12/13/2021 0856   HDL 52 12/13/2021 0856   CHOLHDL 2.8 12/13/2021 0856   CHOLHDL 2.9 04/07/2013 1058   VLDL 36 04/07/2013 1058   LDLCALC 76 12/13/2021 0856   Hepatic Function Panel     Component Value Date/Time   PROT 7.7 02/14/2022 1453   PROT 7.4 09/22/2014 1248   ALBUMIN 4.8 02/14/2022 1453   ALBUMIN 4.2 09/22/2014 1248   AST 15 02/14/2022 1453   AST 37 (H) 09/22/2014  1248   ALT 18 02/14/2022 1453   ALT 61 (H) 09/22/2014 1248   ALKPHOS 98 02/14/2022 1453   ALKPHOS 87 09/22/2014 1248   BILITOT 0.4 02/14/2022 1453   BILITOT 0.64 09/22/2014 1248      Component Value Date/Time   TSH 1.110 11/08/2021 0909   Nutritional Lab Results  Component Value Date   VD25OH 42.2 02/14/2022   VD25OH 33.8 11/08/2021     ASSESSMENT AND PLAN  TREATMENT PLAN FOR OBESITY:  Recommended Dietary Goals  Jessica Mendoza is currently in the action stage of change. As such, her goal is to continue weight management plan. She has agreed to the Category 1 Plan.  Behavioral Intervention  We discussed the following Behavioral Modification Strategies today: increasing lean protein intake, increasing vegetables, increasing fiber rich foods, increase water intake, decrease eating out, work on meal planning and easy cooking plans, and think about ways to increase physical activity.  Additional resources provided today: NA  Recommended Physical Activity Goals  Jessica Mendoza has been advised to work up to 150 minutes of moderate intensity aerobic activity a week and strengthening exercises 2-3 times per week for cardiovascular health, weight loss maintenance and preservation of muscle mass.   She has agreed to Patient also encouraged on scheduling and tracking physical activity.    Pharmacotherapy We discussed various medication options to help Jessica Mendoza with her weight loss efforts and we both agreed to Jessica Mendoza.  ASSOCIATED CONDITIONS ADDRESSED TODAY  Obesity,current BMI 33.5  Vitamin D deficiency Assessment & Plan: Vitamin D level has improved to 42 Reviewed labs February 14, 2022. Energy level is improving on vitamin D Rx 50,000 IU once weekly  Recheck level in 3 months  Orders: -     Vitamin D (Ergocalciferol); Take 1 capsule (50,000 Units total) by mouth every 7 (seven) days.  Dispense: 5 capsule; Refill: 0  Type 2 diabetes mellitus without complication, without long-term  current use of insulin (HCC) Assessment & Plan: Reviewed most recent labs including A1c reduction to 6.1 from 7.7 Doing well on Mounjaro 5 mg once weekly injection without GI side effects She is taking metformin XR 500 mg once daily Enjoying the added satiety She has reduced her intake of added sugar and followed her prescribed meal plan. She has added in resistance training 2 days a week.  Plan to continue prescribed meal plan Increase exercise by adding in 30 minutes of walking 2 days a week Refilled Mounjaro at 5 mg once weekly injection  Orders: -  Tirzepatide; Inject 5 mg into the skin once a week.  Dispense: 2 mL; Refill: 0  Morbid obesity (HCC)      No follow-ups on file.Marland Kitchen She was informed of the importance of frequent follow up visits to maximize her success with intensive lifestyle modifications for her multiple health conditions.   ATTESTASTION STATEMENTS:  Reviewed by clinician on day of visit: allergies, medications, problem list, medical history, surgical history, family history, social history, and previous encounter notes.   I have personally spent 30 minutes total time today in preparation, patient care, nutritional counseling and documentation for this visit, including the following: review of clinical lab tests; review of medical tests/procedures/services.      Dell Ponto, DO

## 2022-04-23 ENCOUNTER — Other Ambulatory Visit: Payer: Self-pay | Admitting: Hematology and Oncology

## 2022-05-09 ENCOUNTER — Other Ambulatory Visit (INDEPENDENT_AMBULATORY_CARE_PROVIDER_SITE_OTHER): Payer: Self-pay | Admitting: Family Medicine

## 2022-05-09 ENCOUNTER — Ambulatory Visit (INDEPENDENT_AMBULATORY_CARE_PROVIDER_SITE_OTHER): Payer: Medicare Other | Admitting: Physician Assistant

## 2022-05-09 ENCOUNTER — Encounter (INDEPENDENT_AMBULATORY_CARE_PROVIDER_SITE_OTHER): Payer: Self-pay | Admitting: Physician Assistant

## 2022-05-09 VITALS — BP 124/76 | HR 76 | Temp 97.9°F | Ht 65.0 in | Wt 204.0 lb

## 2022-05-09 DIAGNOSIS — E559 Vitamin D deficiency, unspecified: Secondary | ICD-10-CM

## 2022-05-09 DIAGNOSIS — Z7985 Long-term (current) use of injectable non-insulin antidiabetic drugs: Secondary | ICD-10-CM

## 2022-05-09 DIAGNOSIS — Z6833 Body mass index (BMI) 33.0-33.9, adult: Secondary | ICD-10-CM | POA: Diagnosis not present

## 2022-05-09 DIAGNOSIS — E669 Obesity, unspecified: Secondary | ICD-10-CM | POA: Diagnosis not present

## 2022-05-09 DIAGNOSIS — Z7984 Long term (current) use of oral hypoglycemic drugs: Secondary | ICD-10-CM

## 2022-05-09 DIAGNOSIS — E119 Type 2 diabetes mellitus without complications: Secondary | ICD-10-CM | POA: Diagnosis not present

## 2022-05-09 MED ORDER — TIRZEPATIDE 7.5 MG/0.5ML ~~LOC~~ SOAJ: 7.5000 mg | SUBCUTANEOUS | 0 refills | Status: DC

## 2022-05-09 MED ORDER — VITAMIN D (ERGOCALCIFEROL) 1.25 MG (50000 UNIT) PO CAPS: 50000.0000 [IU] | ORAL_CAPSULE | ORAL | 0 refills | Status: DC

## 2022-05-09 NOTE — Assessment & Plan Note (Signed)
Vitamin D Deficiency Vitamin D is not at goal of 50.  Most recent vitamin D level was 42.2. She is on  prescription ergocalciferol 50,000 IU weekly. Lab Results  Component Value Date   VD25OH 42.2 02/14/2022   VD25OH 33.8 11/08/2021    Plan: Refill prescription vitamin D 50,000 IU weekly. Check vitamin D level at least 2-3 times yearly to avoid over supplementation.

## 2022-05-09 NOTE — Assessment & Plan Note (Addendum)
Type 2 Diabetes Mellitus with circulatory complications, without long-term current use of insulin HgbA1c is at goal. Last A1c was 6.1 down from 7.7 late last year!  Medication(s): Mounjaro 5.0 mg SQ weekly and metformin 500 mg daily. No side effects with medications. Mild constipation which she eats 1-2 prunes daily and has regular BM as long as eating prunes.   Lab Results  Component Value Date   HGBA1C 6.1 (H) 02/14/2022   HGBA1C 7.7 (H) 11/08/2021   HGBA1C 6.7 (H) 04/07/2013   Lab Results  Component Value Date   MICROALBUR 0.50 04/07/2013   LDLCALC 76 12/13/2021   CREATININE 0.92 02/14/2022   Lab Results  Component Value Date   GFR 76.87 07/08/2012    Plan: Continue and increase dose Mounjaro 7.5 mg SQ weekly and continue metformin 500 mg daily.  Continue working on nutrition plan to decrease simple carbohydrates, increase lean proteins and exercise to promote weight loss and improve glycemic control.

## 2022-05-09 NOTE — Progress Notes (Addendum)
Office: [phone]  /  Fax: [phone]  WEIGHT SUMMARY AND BIOMETRICS  Vitals Temp: 97.9 F (36.6 C) BP: 124/76 Pulse Rate: 76 SpO2: 100 %   Anthropometric Measurements Height: 5\' 5"  (1.651 m) Weight: 204 lb (92.5 kg) BMI (Calculated): 33.95 Weight at Last Visit: 201 lb Weight Lost Since Last Visit: 0 lb Weight Gained Since Last Visit: 3 lb Starting Weight: 214 lb Total Weight Loss (lbs): 13 lb (5.897 kg)   Body Composition  Body Fat %: 44.4 % Fat Mass (lbs): 90.6 lbs Muscle Mass (lbs): 107.6 lbs Total Body Water (lbs): 80 lbs Visceral Fat Rating : 13   Other Clinical Data Fasting: Yes Labs: No Today's Visit #: 8 Starting Date: 11/08/21     HPI  Chief Complaint: OBESITY  Jessica Mendoza is here to discuss her progress with her obesity treatment plan. She is on the the Category 1 Plan and states she is following her eating plan approximately 50 % of the time. She states she is exercising/gym 30 minutes 4 times per week.   Interval History:  Since last office visit she was been working with a Physiological scientist consistently.  She is up muscle mass 2.4 lbs, down 0.2 lbs adipose, overall up 3 lbs.  Down a total of 13 lbs.  Strength training 30 mins 4 times weekly.  Works from home.  Hunger and appetite not as well controlled recently and we discussed increasing Mounjaro today and she is agreeable to increase at this time.  Has 20th Wedding Anniversary coming up!  Pharmacotherapy: Mounjaro 5 mg weekly. No side effects except mild constipation and is eating 1-2 prunes daily with good results.   PHYSICAL EXAM:  Blood pressure 124/76, pulse 76, temperature 97.9 F (36.6 C), height 5\' 5"  (1.651 m), weight 204 lb (92.5 kg), SpO2 100 %. Body mass index is 33.95 kg/m.  General: She is overweight, cooperative, alert, well developed, and in no acute distress. PSYCH: Has normal mood, affect and thought process.   Cardiovascular : Regular rhythm Lungs: Normal breathing  effort, no conversational dyspnea. Neuro: No focal deficits  DIAGNOSTIC DATA REVIEWED:  BMET    Component Value Date/Time   NA 140 02/14/2022 1453   NA 140 09/22/2014 1248   K 3.6 02/14/2022 1453   K 3.6 09/22/2014 1248   CL 100 02/14/2022 1453   CO2 25 02/14/2022 1453   CO2 29 09/22/2014 1248   GLUCOSE 86 02/14/2022 1453   GLUCOSE 80 07/09/2019 0939   GLUCOSE 116 09/22/2014 1248   BUN 13 02/14/2022 1453   BUN 11.9 09/22/2014 1248   CREATININE 0.92 02/14/2022 1453   CREATININE 1.1 09/22/2014 1248   CALCIUM 9.3 02/14/2022 1453   CALCIUM 9.3 09/22/2014 1248   GFRNONAA 71 06/24/2019 1431   GFRNONAA 83 04/07/2013 1058   GFRAA 81 06/24/2019 1431   GFRAA >89 04/07/2013 1058   Lab Results  Component Value Date   HGBA1C 6.1 (H) 02/14/2022   HGBA1C 6.7 (A) 01/09/2011   Lab Results  Component Value Date   INSULIN 22.3 11/08/2021   Lab Results  Component Value Date   TSH 1.110 11/08/2021   CBC    Component Value Date/Time   WBC 7.2 11/08/2021 0909   WBC 5.5 09/22/2014 1248   WBC 5.8 07/08/2012 1500   RBC 4.67 11/08/2021 0909   RBC 4.67 09/22/2014 1248   RBC 4.61 07/08/2012 1500   HGB 12.5 11/08/2021 0909   HGB 12.8 09/22/2014 1248   HCT 37.9 11/08/2021 0909  HCT 39.1 09/22/2014 1248   PLT 234 11/08/2021 0909   MCV 81 11/08/2021 0909   MCV 83.9 09/22/2014 1248   MCH 26.8 11/08/2021 0909   MCH 27.4 09/22/2014 1248   MCH 26.8 03/18/2012 1546   MCHC 33.0 11/08/2021 0909   MCHC 32.6 09/22/2014 1248   MCHC 33.1 07/08/2012 1500   RDW 14.9 11/08/2021 0909   RDW 15.4 (H) 09/22/2014 1248   Iron Studies No results found for: "IRON", "TIBC", "FERRITIN", "IRONPCTSAT" Lipid Panel     Component Value Date/Time   CHOL 145 12/13/2021 0856   TRIG 91 12/13/2021 0856   HDL 52 12/13/2021 0856   CHOLHDL 2.8 12/13/2021 0856   CHOLHDL 2.9 04/07/2013 1058   VLDL 36 04/07/2013 1058   LDLCALC 76 12/13/2021 0856   Hepatic Function Panel     Component Value Date/Time    PROT 7.7 02/14/2022 1453   PROT 7.4 09/22/2014 1248   ALBUMIN 4.8 02/14/2022 1453   ALBUMIN 4.2 09/22/2014 1248   AST 15 02/14/2022 1453   AST 37 (H) 09/22/2014 1248   ALT 18 02/14/2022 1453   ALT 61 (H) 09/22/2014 1248   ALKPHOS 98 02/14/2022 1453   ALKPHOS 87 09/22/2014 1248   BILITOT 0.4 02/14/2022 1453   BILITOT 0.64 09/22/2014 1248      Component Value Date/Time   TSH 1.110 11/08/2021 0909   Nutritional Lab Results  Component Value Date   VD25OH 42.2 02/14/2022   VD25OH 33.8 11/08/2021    ASSOCIATED CONDITIONS ADDRESSED TODAY  ASSESSMENT AND PLAN  Problem List Items Addressed This Visit     Diabetes mellitus, type 2 ([place]) - Primary    Type 2 Diabetes Mellitus with circulatory complications, without long-term current use of insulin HgbA1c is at goal. Last A1c was 6.1 down from 7.7 late last year!  Medication(s): Mounjaro 5.0 mg SQ weekly and metformin 500 mg daily. No side effects with medications. Mild constipation which she eats 1-2 prunes daily and has regular BM as long as eating prunes.   Lab Results  Component Value Date   HGBA1C 6.1 (H) 02/14/2022   HGBA1C 7.7 (H) 11/08/2021   HGBA1C 6.7 (H) 04/07/2013   Lab Results  Component Value Date   MICROALBUR 0.50 04/07/2013   LDLCALC 76 12/13/2021   CREATININE 0.92 02/14/2022   Lab Results  Component Value Date   GFR 76.87 07/08/2012   Plan: Continue and increase dose Mounjaro 7.5 mg SQ weekly and continue metformin 500 mg daily.  Continue working on nutrition plan to decrease simple carbohydrates, increase lean proteins and exercise to promote weight loss and improve glycemic control.         Relevant Medications   tirzepatide (MOUNJARO) 7.5 MG/0.5ML Pen   Generalized obesity   Relevant Medications   tirzepatide (MOUNJARO) 7.5 MG/0.5ML Pen   Vitamin D deficiency    Vitamin D Deficiency Vitamin D is not at goal of 50.  Most recent vitamin D level was 42.2. She is on  prescription ergocalciferol  50,000 IU weekly. Lab Results  Component Value Date   VD25OH 42.2 02/14/2022   VD25OH 33.8 11/08/2021   Plan: Refill prescription vitamin D 50,000 IU weekly. Check vitamin D level at least 2-3 times yearly to avoid over supplementation.        Relevant Medications   Vitamin D, Ergocalciferol, (DRISDOL) 1.25 MG (50000 UNIT) CAPS capsule   Other Visit Diagnoses     BMI 33.0-33.9,adult Current BMI 33.9  TREATMENT PLAN FOR OBESITY:  Recommended Dietary Goals  Jessica Mendoza is currently in the action stage of change. As such, her goal is to continue weight management plan. She has agreed to the Category 1 Plan.  Behavioral Intervention  We discussed the following Behavioral Modification Strategies today: increasing lean protein intake, decreasing simple carbohydrates , increasing vegetables, increasing water intake, work on meal planning and easy cooking plans, planning for success, and keeping healthy foods at home.  Additional resources provided today: NA  Recommended Physical Activity Goals  Jessica Mendoza has been advised to work up to 150 minutes of moderate intensity aerobic activity a week and strengthening exercises 2-3 times per week for cardiovascular health, weight loss maintenance and preservation of muscle mass.   She has agreed to Continue current level of physical activity    Pharmacotherapy We discussed various medication options to help Jessica Mendoza with her weight loss efforts and we both agreed to increase Mounjaro to 7.5 mg weekly for Type 2 diabetes.    Return in about 4 weeks (around 06/06/2022).Marland Kitchen She was informed of the importance of frequent follow up visits to maximize her success with intensive lifestyle modifications for her multiple health conditions.   ATTESTASTION STATEMENTS:  Reviewed by clinician on day of visit: allergies, medications, problem list, medical history, surgical history, family history, social history, and previous encounter notes.   I  have personally spent 43 minutes total time today in preparation, patient care, nutritional counseling and documentation for this visit, including the following: review of clinical lab tests; review of medical tests/procedures/services.      Gehrig Patras, PA-C

## 2022-05-14 ENCOUNTER — Other Ambulatory Visit (INDEPENDENT_AMBULATORY_CARE_PROVIDER_SITE_OTHER): Payer: Self-pay | Admitting: Physician Assistant

## 2022-05-14 ENCOUNTER — Encounter (INDEPENDENT_AMBULATORY_CARE_PROVIDER_SITE_OTHER): Payer: Self-pay | Admitting: Physician Assistant

## 2022-05-14 ENCOUNTER — Telehealth (INDEPENDENT_AMBULATORY_CARE_PROVIDER_SITE_OTHER): Payer: Self-pay | Admitting: Physician Assistant

## 2022-05-14 DIAGNOSIS — E119 Type 2 diabetes mellitus without complications: Secondary | ICD-10-CM

## 2022-05-14 MED ORDER — TIRZEPATIDE 2.5 MG/0.5ML ~~LOC~~ SOAJ: 2.5000 mg | SUBCUTANEOUS | 0 refills | Status: DC

## 2022-05-14 NOTE — Telephone Encounter (Signed)
I spoke with patient to inform her what Raquel Sarna advised for her.  Patient was ok with Shawn sending in the Mounjaro 2.5 mg dose until further notice of a higher dose is in from the manufacturer.

## 2022-05-14 NOTE — Telephone Encounter (Signed)
Patient called stating that her pharmacy is completely out of Mounjaro. Pt was to take another dose today, but she is completely out as well. Please call pt today. She would like to discuss what her alternatives may be and/or replacements. [facility].

## 2022-05-16 ENCOUNTER — Encounter (INDEPENDENT_AMBULATORY_CARE_PROVIDER_SITE_OTHER): Payer: Self-pay | Admitting: Physician Assistant

## 2022-06-05 NOTE — Progress Notes (Unsigned)
TeleHealth Visit:  This visit was completed with telemedicine (audio/video) technology. Jessica Mendoza has verbally consented to this TeleHealth visit. The patient is located at home, the provider is located at home. The participants in this visit include the listed provider and patient. The visit was conducted today via MyChart video.  OBESITY Jessica Mendoza is here to discuss her progress with her obesity treatment plan along with follow-up of her obesity related diagnoses.   Today's visit was # 9 Starting weight: 214 lbs Starting date: 11/08/21 Weight at last in office visit: 204 lbs on 05/09/22 Total weight loss: 10 lbs at last in office visit on 05/09/22. Today's reported weight (06/06/22):  197 lbs  Nutrition Plan: the Category 1 plan - 50-60% adherence.  Current exercise: none  Interim History:  She is very happy to be under 200 lbs! Weight at home- 197 bs. She is having her 20th wedding anniversary today and will be taking a trip to a marriage conference in [place] and then travel on to [place] in 2 days..  She is having very good appetite control with Mounjaro 7.5 mg. She has her big meal at lunch.  Most of the time she only has a protein snack for dinner.   Appetite and cravings very well-controlled with Mounjaro 7.5 mg weekly.  2.5 mg dose had been prescribed because 7.5 mg was unavailable but she then was able to get the 7.5 mg dose.  She never took the 2.5 mg dose.  She twisted her knee and ankle last week ago and has sprains.  She is in a knee brace in a boot.  Eating all of the prescribed protein: no Skipping meals: Yes Drinking adequate water: No- 50-60 oz per day. Drinking sugar sweetened beverages: No Hunger controlled: well controlled. Cravings controlled:  well controlled.   Assessment/Plan:  1. Type 2 Diabetes Mellitus with other specified complication, without long-term current use of insulin HgbA1c is at goal. Last A1c was 6.1 on 02/14/22. CBGs:  Fasting 90s-100s. Post Prandial: 120s.  Has been on steroids and sugars have been higher than usual.  Episodes of hypoglycemia: no Medication(s): Zepbound 7.5 mg SQ weekly. Metformin XR 500 mg M-W F.  She says she is trying to wean herself off. Had some nausea and diarrhea which has subsided.  Appetite well controlled.  Lab Results  Component Value Date   HGBA1C 6.1 (H) 02/14/2022   HGBA1C 7.7 (H) 11/08/2021   HGBA1C 6.7 (H) 04/07/2013   Lab Results  Component Value Date   MICROALBUR 0.50 04/07/2013   LDLCALC 76 12/13/2021   CREATININE 0.92 02/14/2022   Lab Results  Component Value Date   GFR 76.87 07/08/2012    Plan: Continue and refill Zepbound 7.5 mg SQ weekly May continue to take metformin Monday, Wednesday, and Friday.  2. Vitamin D Deficiency Vitamin D is not at goal of 50.  Most recent vitamin D level was slightly low at 42.2 on 02/14/2022.Marland Kitchen She is on  prescription ergocalciferol 50,000 IU weekly. Lab Results  Component Value Date   VD25OH 42.2 02/14/2022   VD25OH 33.8 11/08/2021    Plan: Continue and refill  prescription ergocalciferol 50,000 IU weekly Recheck vitamin D level within the next 2 months.   3. Generalized Obesity: Current BMI 33 Jessica Mendoza is currently in the action stage of change. As such, her goal is to continue with weight loss efforts.  She has agreed to the Category 1 plan.  1.  She will get in at least 20 g of protein  with her evening snack and an added protein snack at some point during the day. 2.  Work on better plan compliance. 3.  Increase water to 64 ounces per day.  Exercise goals: No exercise has been prescribed at this time.  Behavioral modification strategies: increasing lean protein intake, meal planning , and planning for success.  Jessica Mendoza has agreed to follow-up with our clinic in 4 weeks.   No orders of the defined types were placed in this encounter.   Medications Discontinued During This Encounter  Medication Reason    tirzepatide [facility]) 2.5 MG/0.5ML Pen Patient has not taken in last 30 days   Vitamin D, Ergocalciferol, (DRISDOL) 1.25 MG (50000 UNIT) CAPS capsule Reorder     Meds ordered this encounter  Medications   tirzepatide (MOUNJARO) 7.5 MG/0.5ML Pen    Sig: Inject 7.5 mg into the skin once a week.    Dispense:  2 mL    Refill:  0    Order Specific Question:   Supervising Provider    Answer:   Carolin Sicks   Vitamin D, Ergocalciferol, (DRISDOL) 1.25 MG (50000 UNIT) CAPS capsule    Sig: Take 1 capsule (50,000 Units total) by mouth every 7 (seven) days.    Dispense:  5 capsule    Refill:  0    Order Specific Question:   Supervising Provider    Answer:   Glennis Brink [[id number]]      Objective:   VITALS: Per patient if applicable, see vitals. GENERAL: Alert and in no acute distress. CARDIOPULMONARY: No increased WOB. Speaking in clear sentences.  PSYCH: Pleasant and cooperative. Speech normal rate and rhythm. Affect is appropriate. Insight and judgement are appropriate. Attention is focused, linear, and appropriate.  NEURO: Oriented as arrived to appointment on time with no prompting.   Attestation Statements:   Reviewed by clinician on day of visit: allergies, medications, problem list, medical history, surgical history, family history, social history, and previous encounter notes.   This was prepared with the assistance of Engineer, civil (consulting).  Occasional wrong-word or sound-a-like substitutions may have occurred due to the inherent limitations of voice recognition software.

## 2022-06-06 ENCOUNTER — Telehealth (INDEPENDENT_AMBULATORY_CARE_PROVIDER_SITE_OTHER): Payer: Medicare Other | Admitting: Family Medicine

## 2022-06-06 ENCOUNTER — Encounter (INDEPENDENT_AMBULATORY_CARE_PROVIDER_SITE_OTHER): Payer: Self-pay | Admitting: Family Medicine

## 2022-06-06 DIAGNOSIS — Z6833 Body mass index (BMI) 33.0-33.9, adult: Secondary | ICD-10-CM | POA: Diagnosis not present

## 2022-06-06 DIAGNOSIS — Z7984 Long term (current) use of oral hypoglycemic drugs: Secondary | ICD-10-CM

## 2022-06-06 DIAGNOSIS — E669 Obesity, unspecified: Secondary | ICD-10-CM | POA: Diagnosis not present

## 2022-06-06 DIAGNOSIS — E559 Vitamin D deficiency, unspecified: Secondary | ICD-10-CM | POA: Diagnosis not present

## 2022-06-06 DIAGNOSIS — E1169 Type 2 diabetes mellitus with other specified complication: Secondary | ICD-10-CM | POA: Diagnosis not present

## 2022-06-06 DIAGNOSIS — Z7985 Long-term (current) use of injectable non-insulin antidiabetic drugs: Secondary | ICD-10-CM

## 2022-06-06 MED ORDER — VITAMIN D (ERGOCALCIFEROL) 1.25 MG (50000 UNIT) PO CAPS: 50000.0000 [IU] | ORAL_CAPSULE | ORAL | 0 refills | Status: DC

## 2022-06-06 MED ORDER — TIRZEPATIDE 7.5 MG/0.5ML ~~LOC~~ SOAJ: 7.5000 mg | SUBCUTANEOUS | 0 refills | Status: DC

## 2022-06-15 ENCOUNTER — Other Ambulatory Visit (INDEPENDENT_AMBULATORY_CARE_PROVIDER_SITE_OTHER): Payer: Self-pay | Admitting: Physician Assistant

## 2022-06-15 DIAGNOSIS — E559 Vitamin D deficiency, unspecified: Secondary | ICD-10-CM

## 2022-06-19 ENCOUNTER — Telehealth: Payer: Self-pay | Admitting: Hematology and Oncology

## 2022-06-19 NOTE — Telephone Encounter (Signed)
Rescheduled appointments per provider [facility]. Patient is aware of the changes made to her upcoming appointment.

## 2022-06-22 ENCOUNTER — Encounter (HOSPITAL_COMMUNITY): Payer: Self-pay

## 2022-06-22 ENCOUNTER — Ambulatory Visit (HOSPITAL_COMMUNITY)
Admission: RE | Admit: 2022-06-22 | Discharge: 2022-06-22 | Disposition: A | Discharge status: Home / Self Care | Payer: Medicare Other | Source: Ambulatory Visit | Attending: Hematology and Oncology | Admitting: Hematology and Oncology

## 2022-06-22 DIAGNOSIS — C50411 Malignant neoplasm of upper-outer quadrant of right female breast: Secondary | ICD-10-CM | POA: Diagnosis present

## 2022-06-22 DIAGNOSIS — Z170 Estrogen receptor positive status [ER+]: Secondary | ICD-10-CM | POA: Insufficient documentation

## 2022-06-22 LAB — POCT I-STAT CREATININE: Creatinine, Ser: 0.8 mg/dL (ref 0.44–1.00)

## 2022-06-22 MED ORDER — SODIUM CHLORIDE (PF) 0.9 % IJ SOLN
INTRAMUSCULAR | Status: AC
  Filled 2022-06-22: qty 50

## 2022-06-22 MED ORDER — IOHEXOL 300 MG/ML  SOLN
75.0000 mL | Freq: Once | INTRAMUSCULAR | Status: AC | PRN
  Administered 2022-06-22: 75 mL via INTRAVENOUS

## 2022-06-26 NOTE — Assessment & Plan Note (Signed)
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   Survivorship: Encouraged her to continue to exercise and stay active.      Surveillance for breast cancer:  1. Breast exam 11/23/2021: Small nodule in the left chest wall area.  Could be lipoma versus something else.  Therefore we will obtain a CT chest for evaluation. 2. mammograms and ultrasound at [facility]: 11/22/2021: No mammographic finding at the site of palpable concern. 3.  Bone density 11/09/2020: T score 0.5 (used to be 0.2): Normal   11/30/2021: CT chest: No suspicious nodules or masses.  1 cm left axillary lymph node probably benign.  Follow-up CT in 3 to 6 months.  Left thyroid nodules, left adrenal gland thickening 06/26/2022: No suspicious masses.  8 mm left axillary lymph node unchanged   Return to clinic in 1 year for follow-up

## 2022-06-26 NOTE — Progress Notes (Signed)
HEMATOLOGY-ONCOLOGY TELEPHONE VISIT PROGRESS NOTE  I connected with our patient on 06/27/22 at  8:00 AM EDT by telephone and verified that I am speaking with the correct person using two identifiers.  I discussed the limitations, risks, security and privacy concerns of performing an evaluation and management service by telephone and the availability of in person appointments.  I also discussed with the patient that there may be a patient responsible charge related to this service. The patient expressed understanding and agreed to proceed.   History of Present Illness: Jessica Mendoza is a 67 y.o. with above-mentioned history of right breast cancer treated with lumpectomy, radiation, and who is currently on antiestrogen therapy with anastrozole. She presents to the clinic via phone to review scans.  She has been tolerating anastrozole extremely well.  Oncology History  Breast cancer of upper-outer quadrant of right female breast (HCC)  09/08/2014 Mammogram   Right breast: irregular mass with calcifications at 11:00, posterior depth.   09/15/2014 Breast US   Right breast: 1.6 cm irregular mass with indistinct margin at 11:00, posterior depth, 6 CFN. Related microcalcifications. Hypoechoic, correlates with mammographic findings.   09/16/2014 Initial Biopsy   Right breast biopsy: Invasive ductal carcinoma, grade 2, ER+ 100%, PR- 0%, HER-2 negative, Ki-67 10%   09/16/2014 Clinical Stage   Stage IA: T1c N0   09/23/2014 Procedure   Breast/Ovarian panel ([facility]) reveals no clinically signficant variant at ATM, BARD1, BRCA1, BRCA2, BRIP1, CDH1, CHEK2, EPCAM, FANCC, MLH1, MSH2, MSH6, NBN, PALB2, PMS2, PTEN, RAD51C, RAD51D, TP53, and XRCC2.    11/25/2014 Definitive Surgery   Right lumpectomy: IDC 1.7 cm Grade 2 with DCIS, 0/2 LN, ER+ 100, PR- 0%, HER 2 Neg    11/25/2014 Pathologic Stage   Stage IA: T1c N0   11/25/2014 Oncotype testing   Oncotype DX 18, 11% ROR   12/23/2014 - 01/28/2015 Radiation  Therapy   Adjuvant RT: Right breast / 50 Gy in 25 fractions   03/02/2015 -  Anti-estrogen oral therapy   Anastrozole 1 mg daily 5 years   03/31/2015 Survivorship   Survivorship visit completed and copy of care plan given to patient     REVIEW OF SYSTEMS:   Constitutional: Denies fevers, chills or abnormal weight loss All other systems were reviewed with the patient and are negative. Observations/Objective:     Assessment Plan:  Breast cancer of upper-outer quadrant of right female breast (HCC) Rt  Lumpectomy 11/25/14: IDC 1.7 cm Grade 2 with DCIS , 0/2 LN, ER 100, PR 0%, Her 2 Neg T1CN0 (Stage 1A) Oncotype DX 18, 11% risk of recurrence Adjuvant radiation therapy 12/23/2014 to 01/28/2015   Current treatment: Adjuvant antiestrogen therapy with anastrozole 1 mg daily 7 years started 03/02/2015   Anastrozole toxicities: Denies any hot flashes or arthralgias or myalgias.   Survivorship: Encouraged her to continue to exercise and stay active.      Surveillance for breast cancer:  1. Breast exam 11/23/2021: Small nodule in the left chest wall area.  Could be lipoma versus something else.  Therefore we will obtain a CT chest for evaluation. 2. mammograms and ultrasound at [facility]: 11/22/2021: No mammographic finding at the site of palpable concern. 3.  Bone density 11/09/2020: T score 0.5 (used to be 0.2): Normal   11/30/2021: CT chest: No suspicious nodules or masses.  1 cm left axillary lymph node probably benign.  Follow-up CT in 3 to 6 months.  Left thyroid nodules, left adrenal gland thickening 06/26/2022: No suspicious masses.  8  mm left axillary lymph node unchanged   Thyroid nodules: Previously it was biopsied in 2016 and was benign.  Last thyroid ultrasound was 2019.  Will check it again with another thyroid ultrasound to make sure there has not been any change.  Return to clinic in 1 year for follow-up    I discussed the assessment and treatment plan with the patient. The  patient was provided an opportunity to ask questions and all were answered. The patient agreed with the plan and demonstrated an understanding of the instructions. The patient was advised to call back or seek an in-person evaluation if the symptoms worsen or if the condition fails to improve as anticipated.   I provided 12 minutes of non-face-to-face time during this encounter.  This includes time for charting and coordination of care   Tamsen Meek, MD  I Janan Ridge am acting as a scribe for Dr.Tereasa Yilmaz  I have reviewed the above documentation for accuracy and completeness, and I agree with the above.

## 2022-06-27 ENCOUNTER — Ambulatory Visit: Payer: Medicare Other | Admitting: Hematology and Oncology

## 2022-06-27 ENCOUNTER — Telehealth: Payer: Self-pay | Admitting: Hematology and Oncology

## 2022-06-27 ENCOUNTER — Inpatient Hospital Stay: Payer: Medicare Other | Attending: Hematology and Oncology | Admitting: Hematology and Oncology

## 2022-06-27 DIAGNOSIS — C50411 Malignant neoplasm of upper-outer quadrant of right female breast: Secondary | ICD-10-CM

## 2022-06-27 DIAGNOSIS — Z170 Estrogen receptor positive status [ER+]: Secondary | ICD-10-CM

## 2022-06-27 DIAGNOSIS — E042 Nontoxic multinodular goiter: Secondary | ICD-10-CM | POA: Diagnosis not present

## 2022-06-27 NOTE — Telephone Encounter (Signed)
Scheduled appointment per 5/15 los. Patient is aware of the made appointment.

## 2022-07-04 ENCOUNTER — Encounter (INDEPENDENT_AMBULATORY_CARE_PROVIDER_SITE_OTHER): Payer: Self-pay | Admitting: Physician Assistant

## 2022-07-04 ENCOUNTER — Ambulatory Visit (INDEPENDENT_AMBULATORY_CARE_PROVIDER_SITE_OTHER): Payer: Medicare Other | Admitting: Physician Assistant

## 2022-07-04 ENCOUNTER — Telehealth: Payer: Self-pay | Admitting: Internal Medicine

## 2022-07-04 VITALS — BP 121/78 | HR 87 | Temp 98.7°F | Ht 65.0 in | Wt 201.0 lb

## 2022-07-04 DIAGNOSIS — Z6832 Body mass index (BMI) 32.0-32.9, adult: Secondary | ICD-10-CM | POA: Insufficient documentation

## 2022-07-04 DIAGNOSIS — E559 Vitamin D deficiency, unspecified: Secondary | ICD-10-CM | POA: Diagnosis not present

## 2022-07-04 DIAGNOSIS — E1169 Type 2 diabetes mellitus with other specified complication: Secondary | ICD-10-CM

## 2022-07-04 DIAGNOSIS — I4891 Unspecified atrial fibrillation: Secondary | ICD-10-CM

## 2022-07-04 DIAGNOSIS — E669 Obesity, unspecified: Secondary | ICD-10-CM

## 2022-07-04 DIAGNOSIS — Z7985 Long-term (current) use of injectable non-insulin antidiabetic drugs: Secondary | ICD-10-CM

## 2022-07-04 DIAGNOSIS — E042 Nontoxic multinodular goiter: Secondary | ICD-10-CM

## 2022-07-04 DIAGNOSIS — Z6833 Body mass index (BMI) 33.0-33.9, adult: Secondary | ICD-10-CM

## 2022-07-04 MED ORDER — TIRZEPATIDE 7.5 MG/0.5ML ~~LOC~~ SOAJ: 7.5000 mg | SUBCUTANEOUS | 0 refills | Status: DC

## 2022-07-04 NOTE — Progress Notes (Signed)
.smr  Office: [phone]  /  Fax: [phone]  WEIGHT SUMMARY AND BIOMETRICS  Vitals Temp: 98.7 F (37.1 C) BP: 121/78 Pulse Rate: 87 SpO2: 98 %   Anthropometric Measurements Height: 5\' 5"  (1.651 m) Weight: 201 lb (91.2 kg) BMI (Calculated): 33.45 Weight at Last Visit: 204 lb Weight Lost Since Last Visit: 3 lb Weight Gained Since Last Visit: 0 lb Starting Weight: 214 lb Total Weight Loss (lbs): 16 lb (7.258 kg)   Body Composition  Body Fat %: 44.8 % Fat Mass (lbs): 90.4 lbs Muscle Mass (lbs): 105.6 lbs Total Body Water (lbs): 78.8 lbs Visceral Fat Rating : 13   Other Clinical Data Fasting: yes Labs: No Today's Visit #: 9 Starting Date: 11/08/21     HPI  Chief Complaint: OBESITY  Timikia is here to discuss her progress with her obesity treatment plan. She is on the the Category 1 Plan and states she is following her eating plan approximately 50 % of the time. She states she is exercising 0 minutes 0 times per week.   Interval History:  Since last office visit she is down 3 lbs.  Hunger/appetite-significant improvements since starting increased dose of Mounjaro to 7.5 mg weekly. Not skipping meals. Focusing on getting adequate protein.  Cravings- denies Stress- manageable Sleep- adequate overall Exercise-Limited due to right knee and ankle injury on 05/24/22- Saw [facility] and used boot and knee brace for past month and has improved. Using cane for safe ambulation as knee feels like it may give way at times still. We discussed trying the pool at the [facility] to get her moving more again with less stress to the knee and ankle and she is going to check into this.  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 Denies mass in neck, dysphagia, dyspepsia, persistent hoarseness, abdominal pain, or N/V/Constipation or diarrhea. Has annual eye exam. Mood is stable.  Not  skipping meals.   TREATMENT PLAN FOR OBESITY:  Recommended Dietary Goals  Herman is currently in the action stage of change. As such, her goal is to continue weight management plan. She has agreed to the Category 1 Plan.  Behavioral Intervention  We discussed the following Behavioral Modification Strategies today: increasing lean protein intake, decreasing simple carbohydrates , increasing vegetables, increasing lower glycemic fruits, increasing fiber rich foods, avoiding skipping meals, increasing water intake, continue to practice mindfulness when eating, and planning for success.  Additional resources provided today: NA  Recommended Physical Activity Goals  Monta has been advised to work up to 150 minutes of moderate intensity aerobic activity a week and strengthening exercises 2-3 times per week for cardiovascular health, weight loss maintenance and preservation of muscle mass.   She has agreed to Continue current level of physical activity  and Think about ways to increase daily physical activity and overcoming barriers to exercise   Pharmacotherapy We discussed various medication options to help Tiffaney with her weight loss efforts and we both agreed to continue Mounjaro 7.5 mg weekly for Type 2 diabetes.    Return in about 4 weeks (around 08/01/2022).Marland Kitchen She was informed of the importance of frequent follow up visits to maximize her success with intensive lifestyle modifications for her multiple health conditions.  PHYSICAL EXAM:  Blood pressure 121/78, pulse 87, temperature 98.7 F (37.1 C), height 5\' 5"  (1.651 m), weight 201 lb (91.2 kg), SpO2 98 %. Body mass index is  33.45 kg/m.  General: She is overweight, cooperative, alert, well developed, and in no acute distress. Ambulates with straight cane with mildly antalgic appearing gait.  PSYCH: Has normal mood, affect and thought process.   Cardiovascular: HR 80's BP 121/78 Lungs: Normal breathing effort, no conversational  dyspnea.  DIAGNOSTIC DATA REVIEWED:  BMET    Component Value Date/Time   NA 140 02/14/2022 1453   NA 140 09/22/2014 1248   K 3.6 02/14/2022 1453   K 3.6 09/22/2014 1248   CL 100 02/14/2022 1453   CO2 25 02/14/2022 1453   CO2 29 09/22/2014 1248   GLUCOSE 86 02/14/2022 1453   GLUCOSE 80 07/09/2019 0939   GLUCOSE 116 09/22/2014 1248   BUN 13 02/14/2022 1453   BUN 11.9 09/22/2014 1248   CREATININE 0.80 06/22/2022 0804   CREATININE 1.1 09/22/2014 1248   CALCIUM 9.3 02/14/2022 1453   CALCIUM 9.3 09/22/2014 1248   GFRNONAA 71 06/24/2019 1431   GFRNONAA 83 04/07/2013 1058   GFRAA 81 06/24/2019 1431   GFRAA >89 04/07/2013 1058   Lab Results  Component Value Date   HGBA1C 6.1 (H) 02/14/2022   HGBA1C 6.7 (A) 01/09/2011   Lab Results  Component Value Date   INSULIN 22.3 11/08/2021   Lab Results  Component Value Date   TSH 1.110 11/08/2021   CBC    Component Value Date/Time   WBC 7.2 11/08/2021 0909   WBC 5.5 09/22/2014 1248   WBC 5.8 07/08/2012 1500   RBC 4.67 11/08/2021 0909   RBC 4.67 09/22/2014 1248   RBC 4.61 07/08/2012 1500   HGB 12.5 11/08/2021 0909   HGB 12.8 09/22/2014 1248   HCT 37.9 11/08/2021 0909   HCT 39.1 09/22/2014 1248   PLT 234 11/08/2021 0909   MCV 81 11/08/2021 0909   MCV 83.9 09/22/2014 1248   MCH 26.8 11/08/2021 0909   MCH 27.4 09/22/2014 1248   MCH 26.8 03/18/2012 1546   MCHC 33.0 11/08/2021 0909   MCHC 32.6 09/22/2014 1248   MCHC 33.1 07/08/2012 1500   RDW 14.9 11/08/2021 0909   RDW 15.4 (H) 09/22/2014 1248   Iron Studies No results found for: "IRON", "TIBC", "FERRITIN", "IRONPCTSAT" Lipid Panel     Component Value Date/Time   CHOL 145 12/13/2021 0856   TRIG 91 12/13/2021 0856   HDL 52 12/13/2021 0856   CHOLHDL 2.8 12/13/2021 0856   CHOLHDL 2.9 04/07/2013 1058   VLDL 36 04/07/2013 1058   LDLCALC 76 12/13/2021 0856   Hepatic Function Panel     Component Value Date/Time   PROT 7.7 02/14/2022 1453   PROT 7.4 09/22/2014 1248    ALBUMIN 4.8 02/14/2022 1453   ALBUMIN 4.2 09/22/2014 1248   AST 15 02/14/2022 1453   AST 37 (H) 09/22/2014 1248   ALT 18 02/14/2022 1453   ALT 61 (H) 09/22/2014 1248   ALKPHOS 98 02/14/2022 1453   ALKPHOS 87 09/22/2014 1248   BILITOT 0.4 02/14/2022 1453   BILITOT 0.64 09/22/2014 1248      Component Value Date/Time   TSH 1.110 11/08/2021 0909   Nutritional Lab Results  Component Value Date   VD25OH 42.2 02/14/2022   VD25OH 33.8 11/08/2021    ASSOCIATED CONDITIONS ADDRESSED TODAY  ASSESSMENT AND PLAN  Problem List Items Addressed This Visit     Diabetes mellitus, type 2 (HCC) - Primary   Relevant Medications   tirzepatide (MOUNJARO) 7.5 MG/0.5ML Pen   Generalized obesity   Relevant Medications   tirzepatide (MOUNJARO) 7.5 MG/0.5ML  Pen   Vitamin D deficiency   BMI 33.0-33.9,adult Current BMI 33.5   Other Visit Diagnoses     Multiple thyroid nodules          Type 2 Diabetes Mellitus with other specified complication, without long-term current use of insulin HgbA1c is at goal. Last A1c was 6.1- much improved ! Fasting 90-100's. No hypoglycemia.  Has not tolerated statin therapy. No ARB or ACE  Medication(s): Mounjaro 7.5 mg SQ weekly Denies mass in neck, dysphagia, dyspepsia, persistent hoarseness, abdominal pain, or N/V/Constipation or diarrhea. Has annual eye exam. Mood is stable.    Metformin XR 500 mg M-W-F. No GI side effects with metformin.   Lab Results  Component Value Date   HGBA1C 6.1 (H) 02/14/2022   HGBA1C 7.7 (H) 11/08/2021   HGBA1C 6.7 (H) 04/07/2013   Lab Results  Component Value Date   MICROALBUR 0.50 04/07/2013   LDLCALC 76 12/13/2021   CREATININE 0.80 06/22/2022   Lab Results  Component Value Date   GFR 76.87 07/08/2012    Plan: Continue and refill Mounjaro 7.5 mg SQ weekly Continue metformin every M-W-F.  Continue working on nutrition plan to decrease simple carbohydrates, increase lean proteins and exercise to promote weight loss  and improve glycemic control .    Vitamin D Deficiency Vitamin D is not at goal of 50.  Most recent vitamin D level was 42.2. She is on  prescription ergocalciferol 50,000 IU weekly. Lab Results  Component Value Date   VD25OH 42.2 02/14/2022   VD25OH 33.8 11/08/2021    Plan: Continue  prescription ergocalciferol 50,000 IU weekly Reports no refill needed this visit as just picked up Rx.  Low vitamin D levels can be associated with adiposity and may result in leptin resistance and weight gain. Also associated with fatigue. Currently on vitamin D supplementation without any adverse effects.  Will plan to recheck vitamin D level next visit.   Multiple thyroid nodules:  Hx of right breast cancer - S/P lumpectomy, radiation( and remains on anastrozole for antiestrogen Adjuvant therapy)  Had follow up Chest CT 06/26/2022 for evaluation of left axillary node which was felt to be benign/reactive- but showed multiple nodules of thyroid and going for follow up thyroid US soon. Dr. Pamelia Hoit is following.  Plan: Follow with Oncology. Thyroid US scheduled for 07/25/22.   ATTESTASTION STATEMENTS:  Reviewed by clinician on day of visit: allergies, medications, problem list, medical history, surgical history, family history, social history, and previous encounter notes.   I have personally spent 40 minutes total time today in preparation, patient care, nutritional counseling and documentation for this visit, including the following: review of clinical lab tests; review of medical tests/procedures/services.      Chessie Neuharth, PA-C

## 2022-07-04 NOTE — Telephone Encounter (Signed)
Pt is requesting a refill on Metoprolol. Dr. Graciela Husbands did not prescribe this medication. Would Dr. Graciela Husbands like to refill this medication? Please address

## 2022-07-04 NOTE — Telephone Encounter (Signed)
*  STAT* If patient is at the pharmacy, call can be transferred to refill team.   1. Which medications need to be refilled? (please list name of each medication and dose if known)  metoprolol tartrate (LOPRESSOR) 50 MG tablet   2. Which pharmacy/location (including street and city if local pharmacy) is medication to be sent to? [id number] - [place], [place] - [address] AT [address]   3. Do they need a 30 day or 90 day supply? Needs 10 day supply to last her until mail order arrives   Patient is scheduled for an appt 07/31 with Francis Dowse.

## 2022-07-05 NOTE — Telephone Encounter (Signed)
Patient is following up. She states she is completely out of medication and yesterday she was told that an emergency supply would be sent to her pharmacy. Per Shaune Leeks, Dr. Graciela Husbands will not return to the office until Tuesday. It anyone else able to advise on this matter? Patient is very concerned.

## 2022-07-05 NOTE — Telephone Encounter (Signed)
Spoke with pt and asked who has been prescribing Metoprolol.  Pt states Dr Laural Benes has been filling Metoprolol.  Requested pt contact his office to request emergency fill until she received her mail order.  Pt verbalizes understanding and agrees with current plan.

## 2022-07-21 ENCOUNTER — Other Ambulatory Visit (INDEPENDENT_AMBULATORY_CARE_PROVIDER_SITE_OTHER): Payer: Self-pay | Admitting: Family Medicine

## 2022-07-21 DIAGNOSIS — E559 Vitamin D deficiency, unspecified: Secondary | ICD-10-CM

## 2022-07-25 ENCOUNTER — Ambulatory Visit (HOSPITAL_COMMUNITY)
Admission: RE | Admit: 2022-07-25 | Discharge: 2022-07-25 | Disposition: A | Discharge status: Home / Self Care | Payer: Medicare Other | Source: Ambulatory Visit | Attending: Hematology and Oncology | Admitting: Hematology and Oncology

## 2022-07-25 DIAGNOSIS — E042 Nontoxic multinodular goiter: Secondary | ICD-10-CM | POA: Diagnosis present

## 2022-08-01 ENCOUNTER — Ambulatory Visit (INDEPENDENT_AMBULATORY_CARE_PROVIDER_SITE_OTHER): Payer: Medicare Other | Admitting: Physician Assistant

## 2022-08-01 ENCOUNTER — Encounter (INDEPENDENT_AMBULATORY_CARE_PROVIDER_SITE_OTHER): Payer: Self-pay | Admitting: Physician Assistant

## 2022-08-01 VITALS — BP 112/74 | HR 74 | Temp 98.0°F | Ht 65.0 in | Wt 199.0 lb

## 2022-08-01 DIAGNOSIS — E559 Vitamin D deficiency, unspecified: Secondary | ICD-10-CM

## 2022-08-01 DIAGNOSIS — Z6833 Body mass index (BMI) 33.0-33.9, adult: Secondary | ICD-10-CM

## 2022-08-01 DIAGNOSIS — I10 Essential (primary) hypertension: Secondary | ICD-10-CM

## 2022-08-01 DIAGNOSIS — E1169 Type 2 diabetes mellitus with other specified complication: Secondary | ICD-10-CM

## 2022-08-01 DIAGNOSIS — Z7985 Long-term (current) use of injectable non-insulin antidiabetic drugs: Secondary | ICD-10-CM

## 2022-08-01 DIAGNOSIS — K5903 Drug induced constipation: Secondary | ICD-10-CM | POA: Insufficient documentation

## 2022-08-01 DIAGNOSIS — E669 Obesity, unspecified: Secondary | ICD-10-CM

## 2022-08-01 MED ORDER — TIRZEPATIDE 7.5 MG/0.5ML ~~LOC~~ SOAJ: 7.5000 mg | SUBCUTANEOUS | 0 refills | Status: DC

## 2022-08-01 NOTE — Progress Notes (Unsigned)
.smr  Office: [phone]  /  Fax: [phone]  WEIGHT SUMMARY AND BIOMETRICS  Vitals Temp: 98 F (36.7 C) BP: 112/74 Pulse Rate: 74 SpO2: 98 %   Anthropometric Measurements Height: 5\' 5"  (1.651 m) Weight: 199 lb (90.3 kg) BMI (Calculated): 33.12 Weight at Last Visit: 201 lb Weight Lost Since Last Visit: 2 lb Weight Gained Since Last Visit: 0 lb Starting Weight: 214 lb Total Weight Loss (lbs): 18 lb (8.165 kg)   Body Composition  Body Fat %: 45.4 % Fat Mass (lbs): 90.8 lbs Muscle Mass (lbs): 103.4 lbs Total Body Water (lbs): 80.8 lbs Visceral Fat Rating : 13   Other Clinical Data Fasting: yes Labs: no Today's Visit #: 10 Starting Date: 11/08/21     HPI  Chief Complaint: OBESITY  Jessica Mendoza is here to discuss her progress with her obesity treatment plan. She is on the the Category 1 Plan and states she is following her eating plan approximately 60 % of the time. She states she is exercising 0 minutes 0 times per week.   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 We discussed setting an alarm for 8:30 pm as stopping time for activities in order to start bedtime routine with goal of being in bed asleep by 10 pm Exercise- Knee and ankle improving and is not using cane any longer/ increasing activities to tolerance Hydration-Does well- at least 64 oz daily  Has High school reunion coming up in July.   Plan fasting labs next visit.   Pharmacotherapy: Mounjaro 7.5 mg weekly. Denies mass in neck, dysphagia, dyspepsia, persistent hoarseness, abdominal pain, or N/V or diarrhea. Has annual eye exam. Mood is stable. Some mild constipation .    TREATMENT PLAN FOR OBESITY:  Recommended  Dietary Goals  Jessica Mendoza is currently in the action stage of change. As such, her goal is to continue weight management plan. She has agreed to the Category 1 Plan.  Behavioral Intervention  We discussed the following Behavioral Modification Strategies today: increasing lean protein intake, decreasing simple carbohydrates , increasing vegetables, increasing lower glycemic fruits, increasing fiber rich foods, avoiding skipping meals, increasing water intake, continue to practice mindfulness when eating, and planning for success.  Additional resources provided today: NA  Recommended Physical Activity Goals  Jessica Mendoza has been advised to work up to 150 minutes of moderate intensity aerobic activity a week and strengthening exercises 2-3 times per week for cardiovascular health, weight loss maintenance and preservation of muscle mass.   She has agreed to Continue current level of physical activity  and Think about ways to increase daily physical activity and overcoming barriers to exercise   Pharmacotherapy We discussed various medication options to help Jessica Mendoza with her weight loss efforts and we both agreed to continue Mounjaro 7.5 mg weekly for Type 2 diabetes.    Return in about 4 weeks (around 08/29/2022) for Fasting Lab.Marland Kitchen She was informed of the importance of frequent follow up visits to maximize her success with intensive lifestyle modifications for her multiple health conditions.  PHYSICAL EXAM:  Blood pressure 112/74, pulse 74, temperature 98 F (36.7 C), height 5\' 5"  (1.651 m), weight 199 lb (90.3 kg), SpO2 98 %. Body mass index is 33.12 kg/m.  General: She is overweight, cooperative, alert, well developed,  and in no acute distress. PSYCH: Has normal mood, affect and thought process.   Cardiovascular: HR 70's BP 112/74! Lungs: Normal breathing effort, no conversational dyspnea. Neuro: no focal deficits  DIAGNOSTIC DATA REVIEWED:  BMET    Component Value Date/Time   NA 140  02/14/2022 1453   NA 140 09/22/2014 1248   K 3.6 02/14/2022 1453   K 3.6 09/22/2014 1248   CL 100 02/14/2022 1453   CO2 25 02/14/2022 1453   CO2 29 09/22/2014 1248   GLUCOSE 86 02/14/2022 1453   GLUCOSE 80 07/09/2019 0939   GLUCOSE 116 09/22/2014 1248   BUN 13 02/14/2022 1453   BUN 11.9 09/22/2014 1248   CREATININE 0.80 06/22/2022 0804   CREATININE 1.1 09/22/2014 1248   CALCIUM 9.3 02/14/2022 1453   CALCIUM 9.3 09/22/2014 1248   GFRNONAA 71 06/24/2019 1431   GFRNONAA 83 04/07/2013 1058   GFRAA 81 06/24/2019 1431   GFRAA >89 04/07/2013 1058   Lab Results  Component Value Date   HGBA1C 6.1 (H) 02/14/2022   HGBA1C 6.7 (A) 01/09/2011   Lab Results  Component Value Date   INSULIN 22.3 11/08/2021   Lab Results  Component Value Date   TSH 1.110 11/08/2021   CBC    Component Value Date/Time   WBC 7.2 11/08/2021 0909   WBC 5.5 09/22/2014 1248   WBC 5.8 07/08/2012 1500   RBC 4.67 11/08/2021 0909   RBC 4.67 09/22/2014 1248   RBC 4.61 07/08/2012 1500   HGB 12.5 11/08/2021 0909   HGB 12.8 09/22/2014 1248   HCT 37.9 11/08/2021 0909   HCT 39.1 09/22/2014 1248   PLT 234 11/08/2021 0909   MCV 81 11/08/2021 0909   MCV 83.9 09/22/2014 1248   MCH 26.8 11/08/2021 0909   MCH 27.4 09/22/2014 1248   MCH 26.8 03/18/2012 1546   MCHC 33.0 11/08/2021 0909   MCHC 32.6 09/22/2014 1248   MCHC 33.1 07/08/2012 1500   RDW 14.9 11/08/2021 0909   RDW 15.4 (H) 09/22/2014 1248   Iron Studies No results found for: "IRON", "TIBC", "FERRITIN", "IRONPCTSAT" Lipid Panel     Component Value Date/Time   CHOL 145 12/13/2021 0856   TRIG 91 12/13/2021 0856   HDL 52 12/13/2021 0856   CHOLHDL 2.8 12/13/2021 0856   CHOLHDL 2.9 04/07/2013 1058   VLDL 36 04/07/2013 1058   LDLCALC 76 12/13/2021 0856   Hepatic Function Panel     Component Value Date/Time   PROT 7.7 02/14/2022 1453   PROT 7.4 09/22/2014 1248   ALBUMIN 4.8 02/14/2022 1453   ALBUMIN 4.2 09/22/2014 1248   AST 15 02/14/2022 1453    AST 37 (H) 09/22/2014 1248   ALT 18 02/14/2022 1453   ALT 61 (H) 09/22/2014 1248   ALKPHOS 98 02/14/2022 1453   ALKPHOS 87 09/22/2014 1248   BILITOT 0.4 02/14/2022 1453   BILITOT 0.64 09/22/2014 1248      Component Value Date/Time   TSH 1.110 11/08/2021 0909   Nutritional Lab Results  Component Value Date   VD25OH 42.2 02/14/2022   VD25OH 33.8 11/08/2021    ASSOCIATED CONDITIONS ADDRESSED TODAY  ASSESSMENT AND PLAN  Problem List Items Addressed This Visit     Diabetes mellitus, type 2 (HCC) - Primary   Relevant Medications   tirzepatide (MOUNJARO) 7.5 MG/0.5ML Pen   Generalized obesity   Relevant Medications   tirzepatide (MOUNJARO) 7.5 MG/0.5ML Pen   Vitamin D deficiency   Essential hypertension   BMI 33.0-33.9,adult Current BMI 33.5  Drug-induced constipation  Type 2 Diabetes Mellitus with other specified complication, without long-term current use of insulin HgbA1c is at goal. Last A1c was 6.1 CBGs: Fasting 86-117 Episodes of hypoglycemia: no Statin intolerant.  No  Medication(s): Mounjaro 7.5 mg SQ weekly- Denies mass in neck, dysphagia, dyspepsia, persistent hoarseness, abdominal pain, or N/V or diarrhea. Has annual eye exam. Mood is stable. No GI side effects with metformin.  She is working  on nutrition plan to decrease simple carbohydrates, increase lean proteins and exercise to promote weight loss and improve glycemic control.   Lab Results  Component Value Date   HGBA1C 6.1 (H) 02/14/2022   HGBA1C 7.7 (H) 11/08/2021   HGBA1C 6.7 (H) 04/07/2013   Lab Results  Component Value Date   MICROALBUR 0.50 04/07/2013   LDLCALC 76 12/13/2021   CREATININE 0.80 06/22/2022   Lab Results  Component Value Date   GFR 76.87 07/08/2012    Plan: Continue and refill Mounjaro 7.5 mg SQ weekly  Continue working on nutrition plan to decrease simple carbohydrates, increase lean proteins and exercise to promote weight loss and improve glycemic control . Recheck  fasting labs next visit  Hypertension Hypertension improved, asymptomatic, and no significant medication side effects noted.  Medication(s): Amlodipine 5 mg daily   Metoprolol 50 mg p.o. daily  BP Readings from Last 3 Encounters:  08/01/22 112/74  07/04/22 121/78  05/09/22 124/76   Lab Results  Component Value Date   CREATININE 0.80 06/22/2022   CREATININE 0.92 02/14/2022   CREATININE 0.87 11/08/2021   Lab Results  Component Value Date   GFR 76.87 07/08/2012    Plan: Continue all antihypertensives at current dosages.  No hypotension but BP has been running lower since initiating Mounjaro therapy.  Will monitor closely and may need to decrease antihypertensives or even discontinue at some point in the near future. Continue to work on nutrition plan to promote weight loss and improve BP control.  Recheck labs next visit  Vitamin D Deficiency Vitamin D is not at goal of 50.  Most recent vitamin D level was 42.2. She is on  prescription ergocalciferol 50,000 IU weekly. Lab Results  Component Value Date   VD25OH 42.2 02/14/2022   VD25OH 33.8 11/08/2021    Plan: Continue  prescription ergocalciferol 50,000 IU weekly no refill needed this visit Low vitamin D levels can be associated with adiposity and may result in leptin resistance and weight gain. Also associated with fatigue. Currently on vitamin D supplementation without any adverse effects.  Recheck vitamin D level next visit  Constipation Jessica Mendoza notes constipation.   This is likely related to GLP-1.  She has been taking nothing for constipation regularly. Reports every other day BM but previously had daily BM. Constipation is moderately controlled.   Plan: Start metamucil or psyllium husk caps 2-4 daily Increase fiber and water. Monitor response with addition of added fiber and good hydration   ATTESTASTION STATEMENTS:  Reviewed by clinician on day of visit: allergies, medications, problem list, medical history,  surgical history, family history, social history, and previous encounter notes.   I have personally spent 44 minutes total time today in preparation, patient care, nutritional counseling and documentation for this visit, including the following: review of clinical lab tests; review of medical tests/procedures/services.      Benno Brensinger, PA-C

## 2022-08-29 ENCOUNTER — Ambulatory Visit (INDEPENDENT_AMBULATORY_CARE_PROVIDER_SITE_OTHER): Payer: Medicare Other | Admitting: Physician Assistant

## 2022-08-29 ENCOUNTER — Encounter (INDEPENDENT_AMBULATORY_CARE_PROVIDER_SITE_OTHER): Payer: Self-pay | Admitting: Physician Assistant

## 2022-08-29 VITALS — BP 124/76 | HR 57 | Temp 98.0°F | Ht 65.0 in | Wt 196.0 lb

## 2022-08-29 DIAGNOSIS — E1159 Type 2 diabetes mellitus with other circulatory complications: Secondary | ICD-10-CM | POA: Diagnosis not present

## 2022-08-29 DIAGNOSIS — K5903 Drug induced constipation: Secondary | ICD-10-CM | POA: Diagnosis not present

## 2022-08-29 DIAGNOSIS — E559 Vitamin D deficiency, unspecified: Secondary | ICD-10-CM

## 2022-08-29 DIAGNOSIS — E669 Obesity, unspecified: Secondary | ICD-10-CM

## 2022-08-29 DIAGNOSIS — I152 Hypertension secondary to endocrine disorders: Secondary | ICD-10-CM

## 2022-08-29 DIAGNOSIS — E1169 Type 2 diabetes mellitus with other specified complication: Secondary | ICD-10-CM

## 2022-08-29 DIAGNOSIS — R5383 Other fatigue: Secondary | ICD-10-CM

## 2022-08-29 DIAGNOSIS — Z7985 Long-term (current) use of injectable non-insulin antidiabetic drugs: Secondary | ICD-10-CM

## 2022-08-29 DIAGNOSIS — Z6832 Body mass index (BMI) 32.0-32.9, adult: Secondary | ICD-10-CM

## 2022-08-29 MED ORDER — TIRZEPATIDE 7.5 MG/0.5ML ~~LOC~~ SOAJ
7.5000 mg | SUBCUTANEOUS | 0 refills | Status: DC
Start: 2022-08-29 — End: 2022-09-26

## 2022-08-29 NOTE — Progress Notes (Signed)
.smr  Office: [phone]  /  Fax: [phone]  WEIGHT SUMMARY AND BIOMETRICS  Vitals Temp: 98 F (36.7 C) BP: 124/76 Pulse Rate: (!) 57 SpO2: 100 %   Anthropometric Measurements Height: 5\' 5"  (1.651 m) Weight: 196 lb (88.9 kg) BMI (Calculated): 32.62 Weight at Last Visit: 199lb Weight Lost Since Last Visit: 3lb Weight Gained Since Last Visit: 0 Starting Weight: 214lb Total Weight Loss (lbs): 21 lb (9.526 kg)   Body Composition  Body Fat %: 44.5 % Fat Mass (lbs): 87.4 lbs Muscle Mass (lbs): 103.2 lbs Total Body Water (lbs): 78 lbs Visceral Fat Rating : 13   Other Clinical Data Fasting: Yes Labs: Yes Today's Visit #: 11 Starting Date: 11/08/21     HPI  Chief Complaint: OBESITY  Jessica Mendoza is here to discuss her progress with her obesity treatment plan. She is on the the Category 1 Plan and states she is following her eating plan approximately 70 % of the time. She states she is exercising 30 minutes 3 times per week.  Discussed the use of AI scribe software for clinical note transcription with the patient, who gave verbal consent to proceed.  History of Present Illness        The patient, a 67 year old female with a history of obesity, type 2 diabetes, vitamin D deficiency, hypertension, drug-induced constipation, and fatigue, presents for a follow-up visit. She reports adherence to her category one obesity treatment plan approximately 70% of the time and is engaging in chair exercises and cardio workouts three times a week for 30 minutes each session. She is currently on Mounjaro 7.5 mg weekly for diabetes management and has not reported any adverse effects. She has started taking psyllium husk for constipation and reports it is working well. Despite these interventions, she still experiences some fatigue, which she is managing with biotin and CoQ10 supplements. She is also taking vitamin D supplements and has not reported any adverse effects. She reports her hunger  and appetite are manageable, and she is not skipping meals. She occasionally experiences cravings but tries to limit treats to the weekends.   Interval History:  Since last office visit she is down 3 lbs Bioimpedance scale reviewed with the patient Maintained muscle mass Down 3.4 pounds adipose mass Down 1.2 pounds total body water  Hunger/appetite-moderate control Cravings-not excessive  Sleep-mostly restorative, but some occasional poor sleep with some fatigue.  We did discuss episodic use of melatonin supplement to improve sleep quality Exercise-exercising regularly doing cardio and some mild strengthening 30 minutes 3 times per week  She has her 49th reunion coming up at the [facility] and it is an all weekend event.  She is working some of the event.  We did discuss celebration eating strategies such as protein loading prior to the event.  Fasting labs obtained today.  She was informed we would discuss her lab results at her next visit unless there is a critical issue that needs to be addressed sooner. She agreed to keep her next visit at the agreed upon time to discuss these results.   Pharmacotherapy: Mounjaro 7.5 mg weekly. Denies mass in neck, dysphagia, dyspepsia, persistent hoarseness, abdominal pain, or N/V/Constipation or diarrhea. Has annual eye exam. Mood is stable.  Mild constipation has been improved with taking psyllium husk capsules 1 capsule daily  TREATMENT PLAN FOR OBESITY: Obesity: Adherence to category one plan approximately 70% of the time. 3.4 lbs weight loss since last visit. Engaging in chair exercises and cardio 30 minutes 3 times per  week. -Continue current exercise regimen and dietary plan. Recommended Dietary Goals  Jessica Mendoza is currently in the action stage of change. As such, her goal is to continue weight management plan. She has agreed to the Category 1 Plan.  Behavioral Intervention  We discussed the following Behavioral Modification Strategies today:  increasing lean protein intake, decreasing simple carbohydrates , increasing vegetables, increasing lower glycemic fruits, increasing fiber rich foods, avoiding skipping meals, increasing water intake, continue to practice mindfulness when eating, planning for success, and celebration eating strategies.  Additional resources provided today: NA  Recommended Physical Activity Goals  Jessica Mendoza has been advised to work up to 150 minutes of moderate intensity aerobic activity a week and strengthening exercises 2-3 times per week for cardiovascular health, weight loss maintenance and preservation of muscle mass.   She has agreed to Continue current level of physical activity    Pharmacotherapy We discussed various medication options to help Jessica Mendoza with her weight loss efforts and we both agreed to continue Mounjaro 7.5 mg weekly for type 2 diabetes.    Return in about 4 weeks (around 09/26/2022).Marland Kitchen She was informed of the importance of frequent follow up visits to maximize her success with intensive lifestyle modifications for her multiple health conditions.  PHYSICAL EXAM:  Blood pressure 124/76, pulse (!) 57, temperature 98 F (36.7 C), height 5\' 5"  (1.651 m), weight 196 lb (88.9 kg), SpO2 100%. Body mass index is 32.62 kg/m.  General: She is overweight, cooperative, alert, well developed, and in no acute distress. PSYCH: Has normal mood, affect and thought process.   Cardiovascular: HR 50s, BP 124/76.  No peripheral edema. Lungs: Normal breathing effort, no conversational dyspnea. Neuro: No focal deficits  DIAGNOSTIC DATA REVIEWED:  BMET    Component Value Date/Time   NA 140 02/14/2022 1453   NA 140 09/22/2014 1248   K 3.6 02/14/2022 1453   K 3.6 09/22/2014 1248   CL 100 02/14/2022 1453   CO2 25 02/14/2022 1453   CO2 29 09/22/2014 1248   GLUCOSE 86 02/14/2022 1453   GLUCOSE 80 07/09/2019 0939   GLUCOSE 116 09/22/2014 1248   BUN 13 02/14/2022 1453   BUN 11.9 09/22/2014 1248    CREATININE 0.80 06/22/2022 0804   CREATININE 1.1 09/22/2014 1248   CALCIUM 9.3 02/14/2022 1453   CALCIUM 9.3 09/22/2014 1248   GFRNONAA 71 06/24/2019 1431   GFRNONAA 83 04/07/2013 1058   GFRAA 81 06/24/2019 1431   GFRAA >89 04/07/2013 1058   Lab Results  Component Value Date   HGBA1C 6.1 (H) 02/14/2022   HGBA1C 6.7 (A) 01/09/2011   Lab Results  Component Value Date   INSULIN 22.3 11/08/2021   Lab Results  Component Value Date   TSH 1.110 11/08/2021   CBC    Component Value Date/Time   WBC 7.2 11/08/2021 0909   WBC 5.5 09/22/2014 1248   WBC 5.8 07/08/2012 1500   RBC 4.67 11/08/2021 0909   RBC 4.67 09/22/2014 1248   RBC 4.61 07/08/2012 1500   HGB 12.5 11/08/2021 0909   HGB 12.8 09/22/2014 1248   HCT 37.9 11/08/2021 0909   HCT 39.1 09/22/2014 1248   PLT 234 11/08/2021 0909   MCV 81 11/08/2021 0909   MCV 83.9 09/22/2014 1248   MCH 26.8 11/08/2021 0909   MCH 27.4 09/22/2014 1248   MCH 26.8 03/18/2012 1546   MCHC 33.0 11/08/2021 0909   MCHC 32.6 09/22/2014 1248   MCHC 33.1 07/08/2012 1500   RDW 14.9 11/08/2021 0909  RDW 15.4 (H) 09/22/2014 1248   Iron Studies No results found for: "IRON", "TIBC", "FERRITIN", "IRONPCTSAT" Lipid Panel     Component Value Date/Time   CHOL 145 12/13/2021 0856   TRIG 91 12/13/2021 0856   HDL 52 12/13/2021 0856   CHOLHDL 2.8 12/13/2021 0856   CHOLHDL 2.9 04/07/2013 1058   VLDL 36 04/07/2013 1058   LDLCALC 76 12/13/2021 0856   Hepatic Function Panel     Component Value Date/Time   PROT 7.7 02/14/2022 1453   PROT 7.4 09/22/2014 1248   ALBUMIN 4.8 02/14/2022 1453   ALBUMIN 4.2 09/22/2014 1248   AST 15 02/14/2022 1453   AST 37 (H) 09/22/2014 1248   ALT 18 02/14/2022 1453   ALT 61 (H) 09/22/2014 1248   ALKPHOS 98 02/14/2022 1453   ALKPHOS 87 09/22/2014 1248   BILITOT 0.4 02/14/2022 1453   BILITOT 0.64 09/22/2014 1248      Component Value Date/Time   TSH 1.110 11/08/2021 0909   Nutritional Lab Results  Component Value  Date   VD25OH 42.2 02/14/2022   VD25OH 33.8 11/08/2021    ASSOCIATED CONDITIONS ADDRESSED TODAY  ASSESSMENT AND PLAN  Problem List Items Addressed This Visit     Diabetes mellitus, type 2 (HCC) - Primary   Relevant Medications   tirzepatide (MOUNJARO) 7.5 MG/0.5ML Pen   Other Relevant Orders   CMP14+EGFR   Hemoglobin A1c   Insulin, random   Lipid Panel With LDL/HDL Ratio   Generalized obesity   Relevant Medications   tirzepatide (MOUNJARO) 7.5 MG/0.5ML Pen   Vitamin D deficiency   Relevant Orders   VITAMIN D 25 Hydroxy (Vit-D Deficiency, Fractures)   Other fatigue   Relevant Orders   Vitamin B12   CBC with Differential/Platelet   TSH   Hypertension associated with type 2 diabetes mellitus (HCC)   Relevant Medications   tirzepatide (MOUNJARO) 7.5 MG/0.5ML Pen   Drug-induced constipation    Type 2 Diabetes: On Mounjaro 7.5 mg weekly. Self-reported blood glucose levels between 90 and 106. No reported hypoglycemic episodes. -Continue Mounjaro 7.5 mg weekly. -Check A1c and insulin level today.  Vitamin D Deficiency: No reported side effects from current supplementation. -Continue current Vitamin D supplementation. -Recheck Vitamin D level today.  Hypertension: Not discussed in detail during the visit. No changes to the treatment plan mentioned.  Drug Induced Constipation: Improvement with daily psyllium husk. -Continue daily psyllium husk.  Fatigue: Mild persistent fatigue, particularly in the afternoons. Currently on biotin and CoQ10. -Check B12 level today. -Consider occasional use of melatonin for sleep regulation.  General Health Maintenance: -Check TSH today for thyroid function.  ATTESTASTION STATEMENTS:  Reviewed by clinician on day of visit: allergies, medications, problem list, medical history, surgical history, family history, social history, and previous encounter notes.   I have personally spent 42 minutes total time today in preparation, patient  care, nutritional counseling and documentation for this visit, including the following: review of clinical lab tests; review of medical tests/procedures/services.      Sumiya Mamaril, PA-C

## 2022-08-30 LAB — TSH: TSH: 0.773 u[IU]/mL (ref 0.450–4.500)

## 2022-08-30 LAB — LIPID PANEL WITH LDL/HDL RATIO
Cholesterol, Total: 142 mg/dL (ref 100–199)
HDL: 58 mg/dL (ref >39)
LDL Chol Calc (NIH): 68 mg/dL (ref 0–99)
LDL/HDL Ratio: 1.2 ratio (ref 0.0–3.2)
Triglycerides: 84 mg/dL (ref 0–149)
VLDL Cholesterol Cal: 16 mg/dL (ref 5–40)

## 2022-08-30 LAB — CBC WITH DIFFERENTIAL/PLATELET
Basophils Absolute: 0 10*3/uL (ref 0.0–0.2)
Basos: 0 %
EOS (ABSOLUTE): 0.1 10*3/uL (ref 0.0–0.4)
Eos: 3 %
Hematocrit: 37.7 % (ref 34.0–46.6)
Hemoglobin: 12.4 g/dL (ref 11.1–15.9)
Immature Grans (Abs): 0 10*3/uL (ref 0.0–0.1)
Immature Granulocytes: 0 %
Lymphocytes Absolute: 1.6 10*3/uL (ref 0.7–3.1)
Lymphs: 40 %
MCH: 26.7 pg (ref 26.6–33.0)
MCHC: 32.9 g/dL (ref 31.5–35.7)
MCV: 81 fL (ref 79–97)
Monocytes Absolute: 0.4 10*3/uL (ref 0.1–0.9)
Monocytes: 9 %
Neutrophils Absolute: 1.9 10*3/uL (ref 1.4–7.0)
Neutrophils: 48 %
Platelets: 228 10*3/uL (ref 150–450)
RBC: 4.64 x10E6/uL (ref 3.77–5.28)
RDW: 14.6 % (ref 11.7–15.4)
WBC: 4 10*3/uL (ref 3.4–10.8)

## 2022-08-30 LAB — CMP14+EGFR
ALT: 21 IU/L (ref 0–32)
AST: 18 IU/L (ref 0–40)
Albumin: 4.6 g/dL (ref 3.9–4.9)
Alkaline Phosphatase: 93 IU/L (ref 44–121)
BUN/Creatinine Ratio: 14 (ref 12–28)
BUN: 13 mg/dL (ref 8–27)
Bilirubin Total: 0.7 mg/dL (ref 0.0–1.2)
CO2: 25 mmol/L (ref 20–29)
Calcium: 9.6 mg/dL (ref 8.7–10.3)
Chloride: 102 mmol/L (ref 96–106)
Creatinine, Ser: 0.9 mg/dL (ref 0.57–1.00)
Globulin, Total: 2.8 g/dL (ref 1.5–4.5)
Glucose: 75 mg/dL (ref 70–99)
Potassium: 4.4 mmol/L (ref 3.5–5.2)
Sodium: 143 mmol/L (ref 134–144)
Total Protein: 7.4 g/dL (ref 6.0–8.5)
eGFR: 70 mL/min/{1.73_m2} (ref >59)

## 2022-08-30 LAB — HEMOGLOBIN A1C
Est. average glucose Bld gHb Est-mCnc: 120 mg/dL
Hgb A1c MFr Bld: 5.8 % — ABNORMAL HIGH (ref 4.8–5.6)

## 2022-08-30 LAB — INSULIN, RANDOM: INSULIN: 18.4 u[IU]/mL (ref 2.6–24.9)

## 2022-08-30 LAB — VITAMIN D 25 HYDROXY (VIT D DEFICIENCY, FRACTURES): Vit D, 25-Hydroxy: 82.9 ng/mL (ref 30.0–100.0)

## 2022-08-30 LAB — VITAMIN B12: Vitamin B-12: 701 pg/mL (ref 232–1245)

## 2022-09-10 NOTE — Progress Notes (Unsigned)
Cardiology Office Note Date:  09/10/2022  Patient ID:  Jessica Mendoza, Jessica Mendoza 01/26/1956, MRN [id number] PCP:  Corliss Blacker, MD  Electrophysiologist:  Dr. Graciela Husbands     Chief Complaint:  *** annual visit  History of Present Illness: Jessica Mendoza is a 67 y.o. female with history of breast cancer (lumpectomy, radioactive seed placement, chemo), HTN, DM, hypothyroidism, OSA (she reports after losing about 30lbs, retested and negative), HLD, and AFib. Family hx of HCM (her echos have been w/o LVH)  06/24/2019 She continues to do well.  No notable symptoms with her AFib.   No CP cardiac awareness, palpitations.  She no near syncope or syncope.  She continues to exercise regularly via [employer] and [place] program,  Ends up to be 5 days a week.  At peak exercise she is winded, but no exertional incapacities. She is very compliant with her her medicines, no missed doses of her Xarelto in the last 3 weeks if ever. No bleeding or signs of bleeding We discussed DCCV and rhythm control attempt she was agreeable, asked to try and identify, if able to restore SR if she felt improved/diffierent in any way.  DCCV 07/09/19 DCCV was succesful  I saw her 08/19/2019  She is doing very well.  She does feel like her exertional capacity is improved, feels stronger during her workouts and does not get as winded. She missed one dose of her xarelto 2/2 to some car trouble and unable to get back to town one evening.  She denies any symptoms, feels quite well.  She saw d. Noralyn Pick, NP Oct 2021 to establish with the clinic, maintaining SR, no changes were made, encouraged weight loss, rec 6 mo visit  I saw her 04/20/20 She continues to do very well. She does not think she has had any more AF She is at the gym 3d/week, active with good exertional capacity Participating in a wellness program through [facility] as well. No CP, SOB, DOE No dizzy spells, near syncope or syncope. She will occasionally have some  bleeding when she briushes her teeth, her dentist told her was the crown, no bleeding or signs of bleeding otherwise  She has had video visits and in clinic with Dr. Rennis Golden since then, last was 01/03/22, lipid clinic.  Lipids were improved and tolerating regime well.    *** xarelto, bleeding, dose, labs *** burden, symptoms ***   AFib hx 2014  AAD Hx None to date   Past Medical History:  Diagnosis Date   Afib (HCC)    Arthritis    WRISTS AND NECK   Back pain    Benign positional vertigo    Bloating    Breast cancer (HCC) 2016   ER+/PR-/Her2-   Colon polyps 12/2005   Diabetes mellitus, type 2 (HCC) 06/2010   Edema    Essential hypertension, benign    Family history of hypertrophic cardiomyopathy    Family history of ovarian cancer    Family history of pancreatic cancer    Focal nodular hyperplasia of liver    bx 2002   Glaucoma    Goiter 10/2006   Hot flashes    Hyperlipidemia    Hypothyroidism    Joint pain    Migraines    menstrual   Obesity    OSA (obstructive sleep apnea)    Other and unspecified hyperlipidemia    Sleep apnea     Past Surgical History:  Procedure Laterality Date   BREAST LUMPECTOMY  right breast- benign   BREAST LUMPECTOMY WITH RADIOACTIVE SEED AND SENTINEL LYMPH NODE BIOPSY Right 11/25/2014   Procedure: RIGHT BREAST LUMPECTOMY WITH RADIOACTIVE SEED AND RIGHT AXILLARY SENTINEL LYMPH NODE BIOPSY;  Surgeon: Glenna Fellows, MD;  Location: [facility];  Service: General;  Laterality: Right;   CARDIOVERSION N/A 07/11/2012   Procedure: CARDIOVERSION;  Surgeon: Laurey Morale, MD;  Location[facility] ENDOSCOPY;  Service: Cardiovascular;  Laterality: N/A;   CARDIOVERSION N/A 07/09/2019   Procedure: CARDIOVERSION;  Surgeon: Lewayne Bunting, MD;  Location: [facility] ENDOSCOPY;  Service: Cardiovascular;  Laterality: N/A;   LIVER BIOPSY  2002    Current Outpatient Medications  Medication Sig Dispense Refill   Alpha Lipoic Acid 200 MG  CAPS Take 200 mg by mouth daily before breakfast.     amLODipine (NORVASC) 5 MG tablet TAKE 1 TABLET BY MOUTH  DAILY 90 tablet 3   anastrozole (ARIMIDEX) 1 MG tablet TAKE 1 TABLET BY MOUTH DAILY 90 tablet 3   Biotin 5000 MCG CAPS Take 5,000 mcg by mouth at bedtime.     celecoxib (CELEBREX) 200 MG capsule Take by mouth as needed.     Cholecalciferol (VITAMIN D-3) 1000 UNITS CAPS Take 1,000 Units by mouth at bedtime.      Coenzyme Q10 (CO Q 10) 100 MG CAPS Take 100 mg by mouth at bedtime.      COLLAGEN PO Take by mouth daily.     diclofenac Sodium (VOLTAREN) 1 % GEL SMARTSIG:1 sparingly Topical Every 8 Hours PRN     levothyroxine (SYNTHROID, LEVOTHROID) 25 MCG tablet Take 25 mcg by mouth daily before breakfast.     metFORMIN (GLUCOPHAGE-XR) 500 MG 24 hr tablet Take 1 tablet (500 mg total) by mouth daily with breakfast. NO MORE REFILLS WITHOUT OFFICE VISIT - 2ND NOTICE 15 tablet 0   metoprolol tartrate (LOPRESSOR) 50 MG tablet Take 50 mg by mouth daily.     naftifine (NAFTIN) 1 % cream SMARTSIG:sparingly Topical Twice Daily     ONETOUCH VERIO test strip CHECK BS BID  7   oxybutynin (DITROPAN-XL) 10 MG 24 hr tablet Take 1 tablet by mouth daily.     Pitavastatin Magnesium (ZYPITAMAG) 2 MG TABS Take 1 tablet by mouth daily. 90 tablet 3   potassium chloride SA (KLOR-CON M) 20 MEQ tablet      tirzepatide (MOUNJARO) 7.5 MG/0.5ML Pen Inject 7.5 mg into the skin once a week. 2 mL 0   Vitamin D, Ergocalciferol, (DRISDOL) 1.25 MG (50000 UNIT) CAPS capsule Take 1 capsule (50,000 Units total) by mouth every 7 (seven) days. 5 capsule 0   XARELTO 20 MG TABS tablet TAKE 1 TABLET BY MOUTH DAILY  WITH SUPPER 90 tablet 3   No current facility-administered medications for this visit.    Allergies:   Crestor [rosuvastatin], Fenofibrate, Simvastatin, and Statins   Social History:  The patient  reports that she quit smoking about 34 years ago. Her smoking use included cigarettes. She started smoking about 59 years  ago. She has a 12.5 pack-year smoking history. She has never used smokeless tobacco. She reports current alcohol use. She reports that she does not use drugs.   Family History:  The patient's family history includes Alcohol abuse in her father; Breast cancer (age of onset: 40) in her cousin; Diabetes in her paternal grandmother; Heart attack in her mother; Hyperlipidemia in her brother; Hypertension in her brother, mother, and paternal grandmother; Liver cancer (age of onset: 72) in her paternal aunt; Ovarian cancer (  age of onset: 58) in her maternal aunt; Ovarian cancer (age of onset: 73) in her cousin; Pancreatic cancer (age of onset: 60) in her maternal grandfather; Thyroid disease in her mother.  ROS:  Please see the history of present illness.    All other systems are reviewed and otherwise negative.   PHYSICAL EXAM:  VS:  There were no vitals taken for this visit. BMI: There is no height or weight on file to calculate BMI. Well nourished, well developed, in no acute distress  HEENT: normocephalic, atraumatic  Neck: no JVD, carotid bruits or masses Cardiac: *** RRR ; no significant murmurs, no rubs, or gallops Lungs: *** CTA b/l, no wheezing, rhonchi or rales  Abd: soft, nontender, obese MS: no deformity or atrophy Ext: *** trace edema b/l Skin: warm and dry, no rash Neuro:  No gross deficits appreciated Psych: euthymic mood, full affect   EKG:  Not done today ***  05/13/2019; TTE IMPRESSIONS   1. Left ventricular ejection fraction, by estimation, is 60 to 65%. The  left ventricle has normal function. The left ventricle has no regional  wall motion abnormalities. Left ventricular diastolic function could not  be evaluated.   2. Right ventricular systolic function is normal. The right ventricular  size is normal. There is moderately elevated pulmonary artery systolic  pressure. The estimated right ventricular systolic pressure is 42.6 mmHg.   3. Left atrial size was moderately  dilated.   4. Right atrial size was mildly dilated.   5. The mitral valve is normal in structure. Mild mitral valve  regurgitation. No evidence of mitral stenosis.   6. Tricuspid valve regurgitation is mild to moderate.   7. The aortic valve is normal in structure. Aortic valve regurgitation is  not visualized. No aortic stenosis is present.   8. The inferior vena cava is dilated in size with >50% respiratory  variability, suggesting right atrial pressure of 8 mmHg.   Recent Labs: 08/29/2022: ALT 21; BUN 13; Creatinine, Ser 0.90; Hemoglobin 12.4; Platelets 228; Potassium 4.4; Sodium 143; TSH 0.773  12/13/2021: Chol/HDL Ratio 2.8 08/29/2022: Cholesterol, Total 142; HDL 58; LDL Chol Calc (NIH) 68; Triglycerides 84   Estimated Creatinine Clearance: 66.8 mL/min (by C-G formula based on SCr of 0.9 mg/dL).   Wt Readings from Last 3 Encounters:  08/29/22 196 lb (88.9 kg)  08/01/22 199 lb (90.3 kg)  07/04/22 201 lb (91.2 kg)     Other studies reviewed: Additional studies/records reviewed today include: summarized above  ASSESSMENT AND PLAN:  1. Persistent AFib     CHA2DS2Vasc is 4, on Xarelto, *** appropriately dosed     ***  Maintaining SR by symptoms  2. HTN    *** Looks good   Disposition:  ***    Current medicines are reviewed at length with the patient today.  The patient did not have any concerns regarding medicines.  Norma Fredrickson, PA-C 09/10/2022 10:01 AM     [facility] [address] [place] [address] [phone] (office)  [phone] (fax)

## 2022-09-12 ENCOUNTER — Encounter: Payer: Self-pay | Admitting: Physician Assistant

## 2022-09-12 ENCOUNTER — Ambulatory Visit: Payer: Medicare Other | Attending: Physician Assistant | Admitting: Physician Assistant

## 2022-09-12 VITALS — BP 120/70 | HR 71 | Ht 65.0 in | Wt 201.6 lb

## 2022-09-12 DIAGNOSIS — I480 Paroxysmal atrial fibrillation: Secondary | ICD-10-CM

## 2022-09-12 DIAGNOSIS — Z8249 Family history of ischemic heart disease and other diseases of the circulatory system: Secondary | ICD-10-CM | POA: Diagnosis not present

## 2022-09-12 DIAGNOSIS — I10 Essential (primary) hypertension: Secondary | ICD-10-CM | POA: Diagnosis not present

## 2022-09-12 MED ORDER — RIVAROXABAN 20 MG PO TABS
20.0000 mg | ORAL_TABLET | Freq: Every day | ORAL | 3 refills | Status: AC
Start: 2022-09-12 — End: ?

## 2022-09-12 NOTE — Patient Instructions (Signed)
Medication Instructions:  Your physician recommends that you continue on your current medications as directed. Please refer to the Current Medication list given to you today. *If you need a refill on your cardiac medications before your next appointment, please call your pharmacy*   Lab Work: None ordered   Testing/Procedures: Your physician has requested that you have an echocardiogram. Echocardiography is a painless test that uses sound waves to create images of your heart. It provides your doctor with information about the size and shape of your heart and how well your heart's chambers and valves are working. This procedure takes approximately one hour. There are no restrictions for this procedure. Please do NOT wear cologne, perfume, aftershave, or lotions (deodorant is allowed). Please arrive 15 minutes prior to your appointment time.  CHEST CTA W/  W/&O   Follow-Up: At [facility], you and your health needs are our priority.  As part of our continuing mission to provide you with exceptional heart care, we have created designated Provider Care Teams.  These Care Teams include your primary Cardiologist (physician) and Advanced Practice Providers (APPs -  Physician Assistants and Nurse Practitioners) who all work together to provide you with the care you need, when you need it.  We recommend signing up for the patient portal called "MyChart".  Sign up information is provided on this After Visit Summary.  MyChart is used to connect with patients for Virtual Visits (Telemedicine).  Patients are able to view lab/test results, encounter notes, upcoming appointments, etc.  Non-urgent messages can be sent to your provider as well.   To learn more about what you can do with MyChart, go to [email or web address].    Your next appointment:   12 month(s)  Provider:   Sherryl Manges, MD ONLY   Other Instructions

## 2022-09-12 NOTE — Addendum Note (Signed)
Addended by: Alveta Heimlich on: 09/12/2022 11:07 AM   Modules accepted: Orders

## 2022-09-19 ENCOUNTER — Ambulatory Visit (HOSPITAL_COMMUNITY)
Admission: RE | Admit: 2022-09-19 | Discharge: 2022-09-19 | Disposition: A | Discharge status: Home / Self Care | Payer: Medicare Other | Source: Ambulatory Visit | Attending: Physician Assistant | Admitting: Physician Assistant

## 2022-09-19 ENCOUNTER — Encounter (HOSPITAL_COMMUNITY): Payer: Self-pay

## 2022-09-19 DIAGNOSIS — I480 Paroxysmal atrial fibrillation: Secondary | ICD-10-CM | POA: Insufficient documentation

## 2022-09-19 DIAGNOSIS — Z8249 Family history of ischemic heart disease and other diseases of the circulatory system: Secondary | ICD-10-CM | POA: Insufficient documentation

## 2022-09-19 DIAGNOSIS — I10 Essential (primary) hypertension: Secondary | ICD-10-CM | POA: Diagnosis not present

## 2022-09-19 MED ORDER — SODIUM CHLORIDE (PF) 0.9 % IJ SOLN
INTRAMUSCULAR | Status: AC
  Filled 2022-09-19: qty 50

## 2022-09-19 MED ORDER — IOHEXOL 350 MG/ML SOLN
100.0000 mL | Freq: Once | INTRAVENOUS | Status: AC | PRN
  Administered 2022-09-19: 100 mL via INTRAVENOUS

## 2022-09-26 ENCOUNTER — Ambulatory Visit (INDEPENDENT_AMBULATORY_CARE_PROVIDER_SITE_OTHER): Payer: Medicare Other | Admitting: Physician Assistant

## 2022-09-26 ENCOUNTER — Encounter (INDEPENDENT_AMBULATORY_CARE_PROVIDER_SITE_OTHER): Payer: Self-pay | Admitting: Physician Assistant

## 2022-09-26 VITALS — BP 115/70 | HR 72 | Temp 97.7°F | Ht 65.0 in | Wt 195.0 lb

## 2022-09-26 DIAGNOSIS — E039 Hypothyroidism, unspecified: Secondary | ICD-10-CM | POA: Diagnosis not present

## 2022-09-26 DIAGNOSIS — Z7985 Long-term (current) use of injectable non-insulin antidiabetic drugs: Secondary | ICD-10-CM

## 2022-09-26 DIAGNOSIS — E559 Vitamin D deficiency, unspecified: Secondary | ICD-10-CM

## 2022-09-26 DIAGNOSIS — Z6832 Body mass index (BMI) 32.0-32.9, adult: Secondary | ICD-10-CM

## 2022-09-26 DIAGNOSIS — E669 Obesity, unspecified: Secondary | ICD-10-CM

## 2022-09-26 DIAGNOSIS — I4891 Unspecified atrial fibrillation: Secondary | ICD-10-CM

## 2022-09-26 DIAGNOSIS — I422 Other hypertrophic cardiomyopathy: Secondary | ICD-10-CM

## 2022-09-26 DIAGNOSIS — E1169 Type 2 diabetes mellitus with other specified complication: Secondary | ICD-10-CM

## 2022-09-26 DIAGNOSIS — R5383 Other fatigue: Secondary | ICD-10-CM

## 2022-09-26 MED ORDER — VITAMIN D (ERGOCALCIFEROL) 1.25 MG (50000 UNIT) PO CAPS
50000.0000 [IU] | ORAL_CAPSULE | ORAL | 0 refills | Status: DC
Start: 2022-09-26 — End: 2022-10-31

## 2022-09-26 MED ORDER — TIRZEPATIDE 7.5 MG/0.5ML ~~LOC~~ SOAJ
7.5000 mg | SUBCUTANEOUS | 0 refills | Status: DC
Start: 2022-09-26 — End: 2022-10-31

## 2022-09-26 NOTE — Progress Notes (Signed)
.smr  Office: [phone]  /  Fax: [phone]  WEIGHT SUMMARY AND BIOMETRICS  Vitals Temp: 97.7 F (36.5 C) BP: 115/70 Pulse Rate: 72 SpO2: 100 %   Anthropometric Measurements Height: 5\' 5"  (1.651 m) Weight: 195 lb (88.5 kg) BMI (Calculated): 32.45 Weight at Last Visit: 196lb Weight Lost Since Last Visit: 1lb Weight Gained Since Last Visit: 0 Starting Weight: 214lb Total Weight Loss (lbs): 22 lb (9.979 kg)   Body Composition  Body Fat %: 44.8 % Fat Mass (lbs): 87.6 lbs Muscle Mass (lbs): 102.6 lbs Total Body Water (lbs): 77.6 lbs Visceral Fat Rating : 13   Other Clinical Data Fasting: Yes Labs: no Today's Visit #: 13 Starting Date: 11/02/21     HPI  Chief Complaint: OBESITY  Jessica Mendoza is here to discuss her progress with her obesity treatment plan. She is on the the Category 1 Plan and states she is following her eating plan approximately 60 % of the time. She states she is exercising 0 minutes 0 times per week.   Interval History:  Since last office visit she is down 1 lb  Hunger/appetite-well controlled. Not skipping meals. Working on meeting protein goals consistently. Provided information on protein content of foods.  Cravings- no excessive craving Stress- manageable Exercise-not consistently and working on increasing walking  Had 49th reunion and thoroughly enjoyed the weekend!  Grandson got [place] [employer]) for 15th birthday!   Pharmacotherapy:  Mounjaro 7.5 mg weekly. Denies mass in neck, dysphagia, dyspepsia, persistent hoarseness, abdominal pain, or N/V/Constipation or diarrhea. Has annual eye exam. Mood is stable.  Mild constipation has been improved with taking psyllium husk capsules 1 capsule daily  TREATMENT PLAN FOR OBESITY: Total weight loss 22 lbs TBW loss of 10.3% Recommended Dietary Goals  Jessica Mendoza is currently in the action stage of change. As such, her goal is to continue weight management plan. She has agreed to the  Category 1 Plan.  Behavioral Intervention  We discussed the following Behavioral Modification Strategies today: increasing lean protein intake, decreasing simple carbohydrates , increasing vegetables, increasing lower glycemic fruits, increasing fiber rich foods, avoiding skipping meals, increasing water intake, emotional eating strategies and understanding the difference between hunger signals and cravings, continue to practice mindfulness when eating, and planning for success.  Additional resources provided today: NA  Recommended Physical Activity Goals  Jessica Mendoza has been advised to work up to 150 minutes of moderate intensity aerobic activity a week and strengthening exercises 2-3 times per week for cardiovascular health, weight loss maintenance and preservation of muscle mass.   She has agreed to Increase physical activity in their day and reduce sedentary time (increase NEAT). and Work on scheduling and tracking physical activity.    Pharmacotherapy We discussed various medication options to help Jessica Mendoza with her weight loss efforts and we both agreed to continue Mounjaro 7.5 mg weekly for Type 2 diabetes.    Return in about 4 weeks (around 10/24/2022).Marland Kitchen She was informed of the importance of frequent follow up visits to maximize her success with intensive lifestyle modifications for her multiple health conditions.  PHYSICAL EXAM:  Blood pressure 115/70, pulse 72, temperature 97.7 F (36.5 C), height 5\' 5"  (1.651 m), weight 195 lb (88.5 kg), SpO2 100%. Body mass index is 32.45 kg/m.  General: She is overweight, cooperative, alert, well developed, and in no acute distress. PSYCH: Has normal mood, affect and thought process.   Cardiovascular: HR 70's rate controlled, BP 115/70 Lungs: Normal breathing effort, no conversational dyspnea.  DIAGNOSTIC DATA REVIEWED:  BMET    Component Value Date/Time   NA 143 08/29/2022 1332   NA 140 09/22/2014 1248   K 4.4 08/29/2022 1332   K 3.6  09/22/2014 1248   CL 102 08/29/2022 1332   CO2 25 08/29/2022 1332   CO2 29 09/22/2014 1248   GLUCOSE 75 08/29/2022 1332   GLUCOSE 80 07/09/2019 0939   GLUCOSE 116 09/22/2014 1248   BUN 13 08/29/2022 1332   BUN 11.9 09/22/2014 1248   CREATININE 0.90 08/29/2022 1332   CREATININE 1.1 09/22/2014 1248   CALCIUM 9.6 08/29/2022 1332   CALCIUM 9.3 09/22/2014 1248   GFRNONAA 71 06/24/2019 1431   GFRNONAA 83 04/07/2013 1058   GFRAA 81 06/24/2019 1431   GFRAA >89 04/07/2013 1058   Lab Results  Component Value Date   HGBA1C 5.8 (H) 08/29/2022   HGBA1C 6.7 (A) 01/09/2011   Lab Results  Component Value Date   INSULIN 18.4 08/29/2022   INSULIN 22.3 11/08/2021   Lab Results  Component Value Date   TSH 0.773 08/29/2022   CBC    Component Value Date/Time   WBC 4.0 08/29/2022 1332   WBC 5.5 09/22/2014 1248   WBC 5.8 07/08/2012 1500   RBC 4.64 08/29/2022 1332   RBC 4.67 09/22/2014 1248   RBC 4.61 07/08/2012 1500   HGB 12.4 08/29/2022 1332   HGB 12.8 09/22/2014 1248   HCT 37.7 08/29/2022 1332   HCT 39.1 09/22/2014 1248   PLT 228 08/29/2022 1332   MCV 81 08/29/2022 1332   MCV 83.9 09/22/2014 1248   MCH 26.7 08/29/2022 1332   MCH 27.4 09/22/2014 1248   MCH 26.8 03/18/2012 1546   MCHC 32.9 08/29/2022 1332   MCHC 32.6 09/22/2014 1248   MCHC 33.1 07/08/2012 1500   RDW 14.6 08/29/2022 1332   RDW 15.4 (H) 09/22/2014 1248   Iron Studies No results found for: "IRON", "TIBC", "FERRITIN", "IRONPCTSAT" Lipid Panel     Component Value Date/Time   CHOL 142 08/29/2022 1332   TRIG 84 08/29/2022 1332   HDL 58 08/29/2022 1332   CHOLHDL 2.8 12/13/2021 0856   CHOLHDL 2.9 04/07/2013 1058   VLDL 36 04/07/2013 1058   LDLCALC 68 08/29/2022 1332   Hepatic Function Panel     Component Value Date/Time   PROT 7.4 08/29/2022 1332   PROT 7.4 09/22/2014 1248   ALBUMIN 4.6 08/29/2022 1332   ALBUMIN 4.2 09/22/2014 1248   AST 18 08/29/2022 1332   AST 37 (H) 09/22/2014 1248   ALT 21 08/29/2022  1332   ALT 61 (H) 09/22/2014 1248   ALKPHOS 93 08/29/2022 1332   ALKPHOS 87 09/22/2014 1248   BILITOT 0.7 08/29/2022 1332   BILITOT 0.64 09/22/2014 1248      Component Value Date/Time   TSH 0.773 08/29/2022 1332   Nutritional Lab Results  Component Value Date   VD25OH 82.9 08/29/2022   VD25OH 42.2 02/14/2022   VD25OH 33.8 11/08/2021    ASSOCIATED CONDITIONS ADDRESSED TODAY  ASSESSMENT AND PLAN  Problem List Items Addressed This Visit     Diabetes mellitus, type 2 (HCC) - Primary   Relevant Medications   tirzepatide (MOUNJARO) 7.5 MG/0.5ML Pen   Hypothyroidism   Generalized obesity   Relevant Medications   tirzepatide (MOUNJARO) 7.5 MG/0.5ML Pen   Hypertrophic cardiomyopathy (HCC)   Vitamin D deficiency   Relevant Medications   Vitamin D, Ergocalciferol, (DRISDOL) 1.25 MG (50000 UNIT) CAPS capsule   Other fatigue   BMI 32.0-32.9,adult   Type 2 Diabetes  Mellitus with other specified complication, without long-term current use of insulin Labs were reviewed today and discussed with the patient.  HgbA1c is at goal. Last A1c was 5.8- much improved from 7.7 1 year ago!!! Insulin improved at 18.4- not at goal yet.  Medication(s): Mounjaro 7.5 mg SQ weekly Denies mass in neck, dysphagia, dyspepsia, persistent hoarseness, abdominal pain, or N/V/Constipation or diarrhea. Has annual eye exam. Mood is stable.  She is working on nutrition plan to decrease simple carbohydrates, increase lean proteins and exercise to promote weight loss and improve glycemic control.   Lab Results  Component Value Date   HGBA1C 5.8 (H) 08/29/2022   HGBA1C 6.1 (H) 02/14/2022   HGBA1C 7.7 (H) 11/08/2021   Lab Results  Component Value Date   MICROALBUR 0.50 04/07/2013   LDLCALC 68 08/29/2022   CREATININE 0.90 08/29/2022   Lab Results  Component Value Date   GFR 76.87 07/08/2012    Plan: Continue and refill Mounjaro 7.5 mg SQ weekly Continue working on nutrition plan to decrease simple  carbohydrates, increase lean proteins and exercise to promote weight loss and improve glycemic control .   Vitamin D Deficiency Vitamin D is at goal of 50.  Most recent vitamin D level was 82.9. She is on  prescription ergocalciferol 50,000 IU weekly. No N/V or muscle weakness or other side effects with Ergocalciferol.  Lab Results  Component Value Date   VD25OH 82.9 08/29/2022   VD25OH 42.2 02/14/2022   VD25OH 33.8 11/08/2021    Plan: Continue and decrease dose  prescription ergocalciferol 50,000 IU every 14 days Low vitamin D levels can be associated with adiposity and may result in leptin resistance and weight gain. Also associated with fatigue. Currently on vitamin D supplementation without any adverse effects.  Recheck level in 3-4 months to optimize supplementation/avoid over supplementation.   Hypothyroidism Stable.  Does not report symptoms associated with uncontrolled hypothyroidism. Some fatigue Medication(s): Levothyroxine 25 mcg daily.per PCP. No side effects Lab Results  Component Value Date   TSH 0.773 08/29/2022  Hx of multi nodular goiter- Recent CTA chest 09/19/22:  Mediastinum/Nodes: No hilar or mediastinal adenopathy. The esophagus is grossly unremarkable. Enlarged heterogeneous and multinodular thyroid gland in keeping with known goiter. This has been evaluated on previous imaging. (ref: J Am Coll Radiol. 2015 Feb;12(2): 143-50).No mediastinal fluid collection. Plan: Continue levothyroxine at current dose. TSH stable., Monitor periodically.   History Hypertrophic cardiomyopathy/ PAfib:  Has follow up 2D echo ordered with Hx of PA fib on chronic anticoagulation. DCCV successful 07/09/19. Has been maintaining SR.   CTA Chest 09/19/22- MPRESSION:Cardiovascular: Borderline cardiomegaly. No pericardial effusion. Mild atherosclerotic calcification of the thoracic aorta. No aneurysmal dilatation or dissection. The origins of the great vessels of the aortic arch appear  patent. No pulmonary artery embolus identified. 1. No acute intrathoracic pathology. No aortic aneurysm or dissection. 2.  Aortic Atherosclerosis (ICD10-I70.0).   Fatigue:  Normal/good B 12 level at 701. TSH 0.773- stable. CBC normal/stable. Had screening colonoscopy 2017 and does Cologard yearly with negative result.  Plan: Continue biotin/ CoQ 10. Monitor.  ATTESTASTION STATEMENTS:  Reviewed by clinician on day of visit: allergies, medications, problem list, medical history, surgical history, family history, social history, and previous encounter notes.   I have personally spent 45 minutes total time today in preparation, patient care, nutritional counseling and documentation for this visit, including the following: review of clinical lab tests; review of medical tests/procedures/services.       , PA-C

## 2022-10-03 ENCOUNTER — Ambulatory Visit (HOSPITAL_COMMUNITY): Payer: Medicare Other | Attending: Physician Assistant

## 2022-10-03 DIAGNOSIS — Z8249 Family history of ischemic heart disease and other diseases of the circulatory system: Secondary | ICD-10-CM | POA: Diagnosis not present

## 2022-10-03 DIAGNOSIS — I480 Paroxysmal atrial fibrillation: Secondary | ICD-10-CM | POA: Diagnosis not present

## 2022-10-03 DIAGNOSIS — I10 Essential (primary) hypertension: Secondary | ICD-10-CM | POA: Diagnosis present

## 2022-10-03 LAB — ECHOCARDIOGRAM COMPLETE
Area-P 1/2: 4.06 cm2
S' Lateral: 2.4 cm

## 2022-10-31 ENCOUNTER — Ambulatory Visit (INDEPENDENT_AMBULATORY_CARE_PROVIDER_SITE_OTHER): Payer: Medicare Other | Admitting: Physician Assistant

## 2022-10-31 ENCOUNTER — Encounter (INDEPENDENT_AMBULATORY_CARE_PROVIDER_SITE_OTHER): Payer: Self-pay | Admitting: Physician Assistant

## 2022-10-31 VITALS — BP 119/73 | HR 84 | Temp 97.4°F | Ht 65.0 in | Wt 197.0 lb

## 2022-10-31 DIAGNOSIS — K5900 Constipation, unspecified: Secondary | ICD-10-CM

## 2022-10-31 DIAGNOSIS — E669 Obesity, unspecified: Secondary | ICD-10-CM

## 2022-10-31 DIAGNOSIS — E1169 Type 2 diabetes mellitus with other specified complication: Secondary | ICD-10-CM | POA: Diagnosis not present

## 2022-10-31 DIAGNOSIS — E559 Vitamin D deficiency, unspecified: Secondary | ICD-10-CM | POA: Diagnosis not present

## 2022-10-31 DIAGNOSIS — Z6832 Body mass index (BMI) 32.0-32.9, adult: Secondary | ICD-10-CM

## 2022-10-31 DIAGNOSIS — Z7985 Long-term (current) use of injectable non-insulin antidiabetic drugs: Secondary | ICD-10-CM

## 2022-10-31 DIAGNOSIS — K5903 Drug induced constipation: Secondary | ICD-10-CM

## 2022-10-31 MED ORDER — TIRZEPATIDE 7.5 MG/0.5ML ~~LOC~~ SOAJ
7.5000 mg | SUBCUTANEOUS | 0 refills | Status: DC
Start: 2022-10-31 — End: 2022-11-28

## 2022-10-31 MED ORDER — VITAMIN D (ERGOCALCIFEROL) 1.25 MG (50000 UNIT) PO CAPS: 50000.0000 [IU] | ORAL_CAPSULE | ORAL | 0 refills | Status: DC

## 2022-10-31 NOTE — Progress Notes (Signed)
.smr  Office: [phone]  /  Fax: [phone]  WEIGHT SUMMARY AND BIOMETRICS  Vitals Temp: (!) 97.4 F (36.3 C) BP: 119/73 Pulse Rate: 84 SpO2: 97 %   Anthropometric Measurements Height: 5\' 5"  (1.651 m) Weight: 197 lb (89.4 kg) BMI (Calculated): 32.78 Weight at Last Visit: 195 lb Weight Lost Since Last Visit: 0 Weight Gained Since Last Visit: 2 lb Starting Weight: 214 lb Total Weight Loss (lbs): 17 lb (7.711 kg) Peak Weight: 223 lb   Body Composition  Body Fat %: 45.4 % Fat Mass (lbs): 89.8 lbs Muscle Mass (lbs): 102.6 lbs Total Body Water (lbs): 80.4 lbs Visceral Fat Rating : 13   Other Clinical Data Fasting: no Labs: no Today's Visit #: 14 Starting Date: 11/08/21     HPI  Chief Complaint: OBESITY  Jessica Mendoza is here to discuss her progress with her obesity treatment plan. She is on the the Category 1 Plan and states she is following her eating plan approximately 50 % of the time. She states she is exercising chair exercises/walking 30 minutes 3 times per week.  Discussed the use of AI scribe software for clinical note transcription with the patient, who gave verbal consent to proceed.  History of Present Illness  /     Interval History:  Since last office visit she up 2 lbs. She has done some celebration eating over the past month. No excessive cravings. Has her [place] and HS reunion coming up in Oct.  The patient, a 67 year old female with a history of obesity, type 2 diabetes, and vitamin D deficiency, presents for a follow-up visit. She has been on Mounjaro 7.5mg  weekly for her obesity. She reports a recent weight gain and admits to inconsistent exercise and indulging in unhealthy food choices due to recent birthday celebrations. Despite these challenges, she continues to monitor her blood sugars, which have been ranging between 96 and 123. Her blood pressure is well controlled.   Pharmacotherapy: Mounjaro 7.5 mg weekly. Denies mass in neck, dysphagia,  dyspepsia, persistent hoarseness, abdominal pain, or N/V or diarrhea. Has annual eye exam. Mood is stable.  Some mild constipation and takes psyllium capsules every other day with good results.   TREATMENT PLAN FOR OBESITY:  Recommended Dietary Goals  Jessica Mendoza is currently in the action stage of change. As such, her goal is to continue weight management plan. She has agreed to the Category 1 Plan.  Behavioral Intervention  We discussed the following Behavioral Modification Strategies today: increasing lean protein intake, decreasing simple carbohydrates , increasing vegetables, increasing lower glycemic fruits, increasing fiber rich foods, avoiding skipping meals, increasing water intake, decreasing eating out or consumption of processed foods, and making healthy choices when eating convenient foods, continue to practice mindfulness when eating, planning for success, and celebration eating strategies.  Additional resources provided today: NA  Recommended Physical Activity Goals  Demarcus has been advised to work up to 150 minutes of moderate intensity aerobic activity a week and strengthening exercises 2-3 times per week for cardiovascular health, weight loss maintenance and preservation of muscle mass.   She has agreed to Continue current level of physical activity  and discussed trying Sleep yoga exercises to better prepare for bedtime and improve sleep quality.    Pharmacotherapy We discussed various medication options to help Jessica Mendoza with her weight loss efforts and we both agreed to continue Mounjaro 7.5 mg weekly for Type 2 diabetes management.    Return in about 4 weeks (around 11/28/2022).Marland Kitchen She was informed of the importance  of frequent follow up visits to maximize her success with intensive lifestyle modifications for her multiple health conditions.  PHYSICAL EXAM:  Blood pressure 119/73, pulse 84, temperature (!) 97.4 F (36.3 C), height 5\' 5"  (1.651 m), weight 197 lb (89.4 kg),  SpO2 97%. Body mass index is 32.78 kg/m.  General: She is overweight, cooperative, alert, well developed, and in no acute distress. PSYCH: Has normal mood, affect and thought process.   Cardiovascular: HR 80's BP 119/74 Lungs: Normal breathing effort, no conversational dyspnea. Neuro: no focal deficits  DIAGNOSTIC DATA REVIEWED:  BMET    Component Value Date/Time   NA 143 08/29/2022 1332   NA 140 09/22/2014 1248   K 4.4 08/29/2022 1332   K 3.6 09/22/2014 1248   CL 102 08/29/2022 1332   CO2 25 08/29/2022 1332   CO2 29 09/22/2014 1248   GLUCOSE 75 08/29/2022 1332   GLUCOSE 80 07/09/2019 0939   GLUCOSE 116 09/22/2014 1248   BUN 13 08/29/2022 1332   BUN 11.9 09/22/2014 1248   CREATININE 0.90 08/29/2022 1332   CREATININE 1.1 09/22/2014 1248   CALCIUM 9.6 08/29/2022 1332   CALCIUM 9.3 09/22/2014 1248   GFRNONAA 71 06/24/2019 1431   GFRNONAA 83 04/07/2013 1058   GFRAA 81 06/24/2019 1431   GFRAA >89 04/07/2013 1058   Lab Results  Component Value Date   HGBA1C 5.8 (H) 08/29/2022   HGBA1C 6.7 (A) 01/09/2011   Lab Results  Component Value Date   INSULIN 18.4 08/29/2022   INSULIN 22.3 11/08/2021   Lab Results  Component Value Date   TSH 0.773 08/29/2022   CBC    Component Value Date/Time   WBC 4.0 08/29/2022 1332   WBC 5.5 09/22/2014 1248   WBC 5.8 07/08/2012 1500   RBC 4.64 08/29/2022 1332   RBC 4.67 09/22/2014 1248   RBC 4.61 07/08/2012 1500   HGB 12.4 08/29/2022 1332   HGB 12.8 09/22/2014 1248   HCT 37.7 08/29/2022 1332   HCT 39.1 09/22/2014 1248   PLT 228 08/29/2022 1332   MCV 81 08/29/2022 1332   MCV 83.9 09/22/2014 1248   MCH 26.7 08/29/2022 1332   MCH 27.4 09/22/2014 1248   MCH 26.8 03/18/2012 1546   MCHC 32.9 08/29/2022 1332   MCHC 32.6 09/22/2014 1248   MCHC 33.1 07/08/2012 1500   RDW 14.6 08/29/2022 1332   RDW 15.4 (H) 09/22/2014 1248   Iron Studies No results found for: "IRON", "TIBC", "FERRITIN", "IRONPCTSAT" Lipid Panel     Component  Value Date/Time   CHOL 142 08/29/2022 1332   TRIG 84 08/29/2022 1332   HDL 58 08/29/2022 1332   CHOLHDL 2.8 12/13/2021 0856   CHOLHDL 2.9 04/07/2013 1058   VLDL 36 04/07/2013 1058   LDLCALC 68 08/29/2022 1332   Hepatic Function Panel     Component Value Date/Time   PROT 7.4 08/29/2022 1332   PROT 7.4 09/22/2014 1248   ALBUMIN 4.6 08/29/2022 1332   ALBUMIN 4.2 09/22/2014 1248   AST 18 08/29/2022 1332   AST 37 (H) 09/22/2014 1248   ALT 21 08/29/2022 1332   ALT 61 (H) 09/22/2014 1248   ALKPHOS 93 08/29/2022 1332   ALKPHOS 87 09/22/2014 1248   BILITOT 0.7 08/29/2022 1332   BILITOT 0.64 09/22/2014 1248      Component Value Date/Time   TSH 0.773 08/29/2022 1332   Nutritional Lab Results  Component Value Date   VD25OH 82.9 08/29/2022   VD25OH 42.2 02/14/2022   VD25OH 33.8 11/08/2021  ASSOCIATED CONDITIONS ADDRESSED TODAY  ASSESSMENT AND PLAN  Problem List Items Addressed This Visit     Diabetes mellitus, type 2 (HCC) - Primary   Relevant Medications   tirzepatide (MOUNJARO) 7.5 MG/0.5ML Pen   Generalized obesity   Relevant Medications   tirzepatide (MOUNJARO) 7.5 MG/0.5ML Pen   Vitamin D deficiency   Relevant Medications   Vitamin D, Ergocalciferol, (DRISDOL) 1.25 MG (50000 UNIT) CAPS capsule   Drug-induced constipation  Obesity Recent weight gain due to dietary indiscretions. Maintained muscle mass. Discussed the importance of consistent exercise and mindful eating. -Continue Mounjaro 7.5mg  weekly. -Encouraged to increase exercise frequency and consistency. -Plan to refocus on dietary contract.  Type 2 Diabetes Blood sugars ranging from 96-123. A1c was 5.8 at last check. Continue and refill  Mounjaro 7.5 mg weekly -Continue current management and monitor blood sugars. Continue working on nutrition plan to decrease simple carbohydrates, increase lean proteins and exercise to promote weight loss and improve glycemic control.  Constipation Jessica Mendoza notes  constipation.   This is likely related to GLP-1.  She has been taking psyllium capsules but not daily. Constipation is moderately controlled.   Plan: Can increase psyllium to 2-4 capsules daily if desired/needed.  Monitor closely.   Vitamin D Deficiency Vitamin D is at goal of 50.  Most recent vitamin D level was 82.9. She is on  prescription ergocalciferol 50,000 IU every 14 days. No N/V or muscle weakness on Ergocalciferol.  Lab Results  Component Value Date   VD25OH 82.9 08/29/2022   VD25OH 42.2 02/14/2022   VD25OH 33.8 11/08/2021    Plan: Continue and refill  prescription ergocalciferol 50,000 IU every 14 days Low vitamin D levels can be associated with adiposity and may result in leptin resistance and weight gain. Also associated with fatigue. Currently on vitamin D supplementation without any adverse effects.  Recheck vitamin D level over next 1-2 months to optimize supplementation/avoid over supplementation.    Follow-up in 4 weeks on 11/28/2022.  ATTESTASTION STATEMENTS:  Reviewed by clinician on day of visit: allergies, medications, problem list, medical history, surgical history, family history, social history, and previous encounter notes.   I have personally spent 30 minutes total time today in preparation, patient care, nutritional counseling and documentation for this visit, including the following: review of clinical lab tests; review of medical tests/procedures/services.      Cray Monnin, PA-C

## 2022-11-07 ENCOUNTER — Other Ambulatory Visit: Payer: Self-pay

## 2022-11-07 MED ORDER — AMLODIPINE BESYLATE 5 MG PO TABS: 5.0000 mg | ORAL_TABLET | Freq: Every day | ORAL | 2 refills | Status: DC

## 2022-11-07 NOTE — Telephone Encounter (Signed)
Pt's medication was sent to pt's pharmacy as requested. Confirmation received.  °

## 2022-11-28 ENCOUNTER — Encounter (INDEPENDENT_AMBULATORY_CARE_PROVIDER_SITE_OTHER): Payer: Self-pay | Admitting: Physician Assistant

## 2022-11-28 ENCOUNTER — Ambulatory Visit (INDEPENDENT_AMBULATORY_CARE_PROVIDER_SITE_OTHER): Payer: Medicare Other | Admitting: Physician Assistant

## 2022-11-28 VITALS — BP 128/72 | HR 80 | Temp 97.4°F | Ht 65.0 in | Wt 194.0 lb

## 2022-11-28 DIAGNOSIS — I152 Hypertension secondary to endocrine disorders: Secondary | ICD-10-CM

## 2022-11-28 DIAGNOSIS — E559 Vitamin D deficiency, unspecified: Secondary | ICD-10-CM | POA: Diagnosis not present

## 2022-11-28 DIAGNOSIS — E1159 Type 2 diabetes mellitus with other circulatory complications: Secondary | ICD-10-CM

## 2022-11-28 DIAGNOSIS — E1169 Type 2 diabetes mellitus with other specified complication: Secondary | ICD-10-CM

## 2022-11-28 DIAGNOSIS — Z7985 Long-term (current) use of injectable non-insulin antidiabetic drugs: Secondary | ICD-10-CM

## 2022-11-28 DIAGNOSIS — E669 Obesity, unspecified: Secondary | ICD-10-CM

## 2022-11-28 DIAGNOSIS — G479 Sleep disorder, unspecified: Secondary | ICD-10-CM | POA: Insufficient documentation

## 2022-11-28 DIAGNOSIS — Z6832 Body mass index (BMI) 32.0-32.9, adult: Secondary | ICD-10-CM

## 2022-11-28 MED ORDER — TIRZEPATIDE 7.5 MG/0.5ML ~~LOC~~ SOAJ
7.5000 mg | SUBCUTANEOUS | 0 refills | Status: DC
Start: 2022-11-28 — End: 2023-01-02

## 2022-11-28 NOTE — Progress Notes (Signed)
.smr  Office: [phone]  /  Fax: [phone]  WEIGHT SUMMARY AND BIOMETRICS  Vitals Temp: (!) 97.4 F (36.3 C) BP: 128/72 Pulse Rate: 80 SpO2: 98 %   Anthropometric Measurements Height: 5\' 5"  (1.651 m) Weight: 194 lb (88 kg) BMI (Calculated): 32.28 Weight at Last Visit: 197 lb Weight Lost Since Last Visit: 3 lb Weight Gained Since Last Visit: 0 Starting Weight: 214 lb Total Weight Loss (lbs): 20 lb (9.072 kg) Peak Weight: 223 lb   Body Composition  Body Fat %: 43.8 % Fat Mass (lbs): 85 lbs Muscle Mass (lbs): 103.4 lbs Total Body Water (lbs): 78.8 lbs Visceral Fat Rating : 13   Other Clinical Data Fasting: no Labs: no Today's Visit #: 15 Starting Date: 11/08/21     HPI  Chief Complaint: OBESITY  Jessica Mendoza is here to discuss her progress with her obesity treatment plan. She is on the the Category 1 Plan and states she is following her eating plan approximately 75 % of the time. She states she is exercising chair exercise/dancing/bands 30 minutes 3 times per week.   Interval History:  Since last office visit she is down 3 lbs.   The patient, with a history of obesity and type 2 diabetes, presents for a follow-up visit. She reports significant progress in her weight loss journey, now weighing under 200 pounds for the first time in many years. The patient attributes this success to increased physical activity and dietary changes, including increased fruit intake and use of psyllium for fiber. Despite these positive changes, the patient reports ongoing struggles with sleep regulation. The patient is also on Mounjaro 7.5mg  daily for diabetes management, Norvasc and Lopressor for blood pressure control, Xarelto for AFib, and Vitamin D supplementation.    Pharmacotherapy: Mounjaro 7.5 mg weekly. Denies mass in neck, dysphagia, dyspepsia, persistent hoarseness, abdominal pain, or N/V/Constipation or diarrhea. Has annual eye exam. Mood is stable.    TREATMENT PLAN  FOR OBESITY:  Recommended Dietary Goals  Jessica Mendoza is currently in the action stage of change. As such, her goal is to continue weight management plan. She has agreed to the Category 1 Plan.  Behavioral Intervention  We discussed the following Behavioral Modification Strategies today: continue to work on maintaining a reduced calorie state, getting the recommended amount of protein, incorporating whole foods, making healthy choices, staying well hydrated and practicing mindfulness when eating..  Additional resources provided today: NA  Recommended Physical Activity Goals  Jessica Mendoza has been advised to work up to 150 minutes of moderate intensity aerobic activity a week and strengthening exercises 2-3 times per week for cardiovascular health, weight loss maintenance and preservation of muscle mass.   She has agreed to Continue current level of physical activity  and Increase physical activity in their day and reduce sedentary time (increase NEAT).   Pharmacotherapy We discussed various medication options to help Jessica Mendoza with her weight loss efforts and we both agreed to continue Mounjaro 7.5 mg weekly.    Return in about 4 weeks (around 12/26/2022).Marland Kitchen She was informed of the importance of frequent follow up visits to maximize her success with intensive lifestyle modifications for her multiple health conditions.  PHYSICAL EXAM:  Blood pressure 128/72, pulse 80, temperature (!) 97.4 F (36.3 C), height 5\' 5"  (1.651 m), weight 194 lb (88 kg), SpO2 98%. Body mass index is 32.28 kg/m.  General: She is overweight, cooperative, alert, well developed, and in no acute distress. PSYCH: Has normal mood, affect and thought process.   Cardiovascular: HR  80's BP 128/7 Lungs: Normal breathing effort, no conversational dyspnea. Neuro: non focal deficits  DIAGNOSTIC DATA REVIEWED:  BMET    Component Value Date/Time   NA 143 08/29/2022 1332   NA 140 09/22/2014 1248   K 4.4 08/29/2022 1332   K 3.6  09/22/2014 1248   CL 102 08/29/2022 1332   CO2 25 08/29/2022 1332   CO2 29 09/22/2014 1248   GLUCOSE 75 08/29/2022 1332   GLUCOSE 80 07/09/2019 0939   GLUCOSE 116 09/22/2014 1248   BUN 13 08/29/2022 1332   BUN 11.9 09/22/2014 1248   CREATININE 0.90 08/29/2022 1332   CREATININE 1.1 09/22/2014 1248   CALCIUM 9.6 08/29/2022 1332   CALCIUM 9.3 09/22/2014 1248   GFRNONAA 71 06/24/2019 1431   GFRNONAA 83 04/07/2013 1058   GFRAA 81 06/24/2019 1431   GFRAA >89 04/07/2013 1058   Lab Results  Component Value Date   HGBA1C 5.8 (H) 08/29/2022   HGBA1C 6.7 (A) 01/09/2011   Lab Results  Component Value Date   INSULIN 18.4 08/29/2022   INSULIN 22.3 11/08/2021   Lab Results  Component Value Date   TSH 0.773 08/29/2022   CBC    Component Value Date/Time   WBC 4.0 08/29/2022 1332   WBC 5.5 09/22/2014 1248   WBC 5.8 07/08/2012 1500   RBC 4.64 08/29/2022 1332   RBC 4.67 09/22/2014 1248   RBC 4.61 07/08/2012 1500   HGB 12.4 08/29/2022 1332   HGB 12.8 09/22/2014 1248   HCT 37.7 08/29/2022 1332   HCT 39.1 09/22/2014 1248   PLT 228 08/29/2022 1332   MCV 81 08/29/2022 1332   MCV 83.9 09/22/2014 1248   MCH 26.7 08/29/2022 1332   MCH 27.4 09/22/2014 1248   MCH 26.8 03/18/2012 1546   MCHC 32.9 08/29/2022 1332   MCHC 32.6 09/22/2014 1248   MCHC 33.1 07/08/2012 1500   RDW 14.6 08/29/2022 1332   RDW 15.4 (H) 09/22/2014 1248   Iron Studies No results found for: "IRON", "TIBC", "FERRITIN", "IRONPCTSAT" Lipid Panel     Component Value Date/Time   CHOL 142 08/29/2022 1332   TRIG 84 08/29/2022 1332   HDL 58 08/29/2022 1332   CHOLHDL 2.8 12/13/2021 0856   CHOLHDL 2.9 04/07/2013 1058   VLDL 36 04/07/2013 1058   LDLCALC 68 08/29/2022 1332   Hepatic Function Panel     Component Value Date/Time   PROT 7.4 08/29/2022 1332   PROT 7.4 09/22/2014 1248   ALBUMIN 4.6 08/29/2022 1332   ALBUMIN 4.2 09/22/2014 1248   AST 18 08/29/2022 1332   AST 37 (H) 09/22/2014 1248   ALT 21 08/29/2022  1332   ALT 61 (H) 09/22/2014 1248   ALKPHOS 93 08/29/2022 1332   ALKPHOS 87 09/22/2014 1248   BILITOT 0.7 08/29/2022 1332   BILITOT 0.64 09/22/2014 1248      Component Value Date/Time   TSH 0.773 08/29/2022 1332   Nutritional Lab Results  Component Value Date   VD25OH 82.9 08/29/2022   VD25OH 42.2 02/14/2022   VD25OH 33.8 11/08/2021    ASSOCIATED CONDITIONS ADDRESSED TODAY  ASSESSMENT AND PLAN  Problem List Items Addressed This Visit     Diabetes mellitus, type 2 (HCC) - Primary   Relevant Medications   tirzepatide (MOUNJARO) 7.5 MG/0.5ML Pen   Generalized obesity   Relevant Medications   tirzepatide (MOUNJARO) 7.5 MG/0.5ML Pen   Vitamin D deficiency   Hypertension associated with type 2 diabetes mellitus (HCC)   Relevant Medications   tirzepatide [facility])  7.5 MG/0.5ML Pen   BMI 32.0-32.9,adult   Sleep disorder   Obesity Significant progress with weight loss, now consistently under 200 lbs for the first time in many years. Noted increase in muscle mass and decrease in adipose tissue. Patient reports positive changes in dietary habits and increased exercise. -Continue current exercise and dietary regimen. -Continue Mounjaro 7.5mg  weekly For Type 2 diabetes management.   Type 2 Diabetes Mellitus with other specified complication, without long-term current use of insulin HgbA1c is at goal. Last A1c was   Medication(s): Mounjaro 7.5 mg SQ weekly Denies mass in neck, dysphagia, dyspepsia, persistent hoarseness, abdominal pain, or N/V/Constipation or diarrhea. Has annual eye exam. Mood is stable.  Taking psyllium husk caps daily and reports daily regular BM.   Lab Results  Component Value Date   HGBA1C 5.8 (H) 08/29/2022   HGBA1C 6.1 (H) 02/14/2022   HGBA1C 7.7 (H) 11/08/2021   Lab Results  Component Value Date   MICROALBUR 0.50 04/07/2013   LDLCALC 68 08/29/2022   CREATININE 0.90 08/29/2022   Lab Results  Component Value Date   GFR 76.87 07/08/2012     Plan: Continue and refill Mounjaro 7.5 mg SQ weekly She is working  on nutrition plan to decrease simple carbohydrates, increase lean proteins and exercise to promote weight loss and improve glycemic control . Recheck A1c over next 1-2 visits.   Hypertension Blood pressure well controlled. Currently on Norvasc and Metoprolol for hypertension and atrial fibrillation respectively. Discussed potential future decrease or discontinuation of Norvasc. -Continue current medication regimen. -Reevaluate need for Norvasc at future visits. Continue to work on nutrition plan to promote weight loss and improve BP control.  Continued close monitoring of BP along with cardiology as can see significant improvements in BP control on Mounjaro. No hypotension or symptoms of hypotension currently.    History of Atrial Fibrillation No specific concerns raised during this visit. Patient on Metoprolol and Xarelto for management. Following regularly with Cardiology. Last visit 09/10/2022 and had F/U 2D echo and Chest CTA without concerning findings. Denies any chest pain, palpitations or any other concerns and exercising regularly without difficulty as noted.   2 D echo 10/03/2022:  IMPRESSIONS  1. Left ventricular ejection fraction, by estimation, is 60 to 65%. The  left ventricle has normal function. The left ventricle has no regional  wall motion abnormalities. Left ventricular diastolic parameters are  consistent with Grade I diastolic  dysfunction (impaired relaxation).   2. Right ventricular systolic function is normal. The right ventricular  size is normal. There is normal pulmonary artery systolic pressure.   3. The mitral valve is normal in structure. Trivial mitral valve  regurgitation. No evidence of mitral stenosis.   4. The aortic valve is tricuspid. Aortic valve regurgitation is not  visualized. No aortic stenosis is present.   5. The inferior vena cava is normal in size with greater than 50%   respiratory variability, suggesting right atrial pressure of 3 mmHg.   CT Angio chest 09/19/2022:  IMPRESSION: 1. No acute intrathoracic pathology. No aortic aneurysm or dissection. 2.  Aortic Atherosclerosis (ICD10-I70.0).   Vitamin D Deficiency Vitamin D is at goal of 50.  Most recent vitamin D level was 82.9. She is on  prescription ergocalciferol 50,000 IU every 14 days. No N/V or muscle weakness. Decreased dose based on recent level and will plan to recheck over the next 1-2 months.  Lab Results  Component Value Date   VD25OH 82.9 08/29/2022   VD25OH 42.2 02/14/2022  VD25OH 33.8 11/08/2021    Plan: Continue  prescription ergocalciferol 50,000 IU every 14 days Low vitamin D levels can be associated with adiposity and may result in leptin resistance and weight gain. Also associated with fatigue. Currently on vitamin D supplementation without any adverse effects.  Recheck vitamin D level over next 1-2 visits to optimize supplementation/avoid over supplementation.   Sleep Issues/ Previous history of OSA ( improved/resolved following weight loss on retest)  Patient reports inconsistent sleep patterns. Discussed potential strategies including early morning sun exposure, wearing socks to bed, and separate covers. -Implement suggested strategies for improved sleep.  Follow-up appointment scheduled for 215pm on 12/02/2022. ATTESTASTION STATEMENTS:  Reviewed by clinician on day of visit: allergies, medications, problem list, medical history, surgical history, family history, social history, and previous encounter notes.   I have personally spent 45 minutes total time today in preparation, patient care, nutritional counseling and documentation for this visit, including the following: review of clinical lab tests; review of medical tests/procedures/services.      Derricka Mertz, PA-C

## 2022-11-29 ENCOUNTER — Encounter (INDEPENDENT_AMBULATORY_CARE_PROVIDER_SITE_OTHER): Payer: Self-pay | Admitting: Physician Assistant

## 2022-11-29 ENCOUNTER — Encounter: Payer: Self-pay | Admitting: Hematology and Oncology

## 2022-12-27 LAB — LAB REPORT - SCANNED
Albumin, Urine POC: 1.7
Creatinine, POC: 101 mg/dL
Microalb Creat Ratio: 17

## 2022-12-31 ENCOUNTER — Other Ambulatory Visit: Payer: Self-pay | Admitting: Internal Medicine

## 2023-01-02 ENCOUNTER — Ambulatory Visit (INDEPENDENT_AMBULATORY_CARE_PROVIDER_SITE_OTHER): Payer: Medicare Other | Admitting: Physician Assistant

## 2023-01-02 ENCOUNTER — Encounter (INDEPENDENT_AMBULATORY_CARE_PROVIDER_SITE_OTHER): Payer: Self-pay | Admitting: Physician Assistant

## 2023-01-02 VITALS — BP 118/75 | HR 84 | Temp 98.0°F | Ht 65.0 in | Wt 195.0 lb

## 2023-01-02 DIAGNOSIS — Z6832 Body mass index (BMI) 32.0-32.9, adult: Secondary | ICD-10-CM | POA: Diagnosis not present

## 2023-01-02 DIAGNOSIS — E669 Obesity, unspecified: Secondary | ICD-10-CM | POA: Diagnosis not present

## 2023-01-02 DIAGNOSIS — Z7985 Long-term (current) use of injectable non-insulin antidiabetic drugs: Secondary | ICD-10-CM

## 2023-01-02 DIAGNOSIS — E1169 Type 2 diabetes mellitus with other specified complication: Secondary | ICD-10-CM

## 2023-01-02 DIAGNOSIS — E119 Type 2 diabetes mellitus without complications: Secondary | ICD-10-CM | POA: Diagnosis not present

## 2023-01-02 DIAGNOSIS — E559 Vitamin D deficiency, unspecified: Secondary | ICD-10-CM | POA: Diagnosis not present

## 2023-01-02 MED ORDER — VITAMIN D (ERGOCALCIFEROL) 1.25 MG (50000 UNIT) PO CAPS
50000.0000 [IU] | ORAL_CAPSULE | ORAL | 0 refills | Status: DC
Start: 2023-01-02 — End: 2023-01-30

## 2023-01-02 MED ORDER — TIRZEPATIDE 7.5 MG/0.5ML ~~LOC~~ SOAJ
7.5000 mg | SUBCUTANEOUS | 0 refills | Status: DC
Start: 2023-01-02 — End: 2023-01-30

## 2023-01-02 NOTE — Progress Notes (Signed)
SUBJECTIVE:  Chief Complaint: Obesity  Interim History: Jessica Mendoza is a 67 year old female who presented for a follow-up visit regarding her obesity treatment plan. She reported a recent lapse in her dietary regimen due to a vacation and the Halloween holiday, leading to a lack of meal preparation and consumption of unhealthy foods. Despite this setback, the patient expressed awareness of the necessary steps to regain control of her diet, including meal planning and preparation.  The patient's diet is predominantly pescatarian, excluding beef.  We discussed [employer] prepared meats into her meals for convenience and variety. The patient also reported a preference for stir-fry dishes and has been making healthier choices when dining out, such as opting for more vegetables than rice in her hibachi salmon dish.  Regarding exercise, the patient has been consistent with chair exercises and strength training using dumbbells at home. She expressed a desire to increase her strength training and is considering returning to the gym for more structured workouts.  The patient's medication regimen includes 7.5mg  of Mounjaro weekly, which she reported taking without significant side effects. She also takes Metformin, prescribed once daily, but she has chosen to take it every other day. The patient also takes Vitamin D supplements every 14 days.  Recent weight gain, which she attributed to her dietary lapse and water retention from consuming salty foods. . The patient expressed a commitment to reversing the weight gain and returning to her obesity treatment plan. We discussed strategies for managing her diet during the upcoming Thanksgiving holiday, including portion control and post-meal walks to prevent blood sugar spikes.  Jessica Mendoza is here to discuss her progress with her obesity treatment plan. She is on the Category 1 Plan and states she is following her eating plan approximately 50 % of the time. She  states she is exercising Chair exercises/strength training 15 minutes 2-3 times per week.   OBJECTIVE: Visit Diagnoses: Problem List Items Addressed This Visit     Diabetes mellitus, type 2 (HCC) - Primary   Relevant Medications   tirzepatide (MOUNJARO) 7.5 MG/0.5ML Pen   Generalized obesity   Relevant Medications   tirzepatide (MOUNJARO) 7.5 MG/0.5ML Pen   Vitamin D deficiency   Relevant Medications   Vitamin D, Ergocalciferol, (DRISDOL) 1.25 MG (50000 UNIT) CAPS capsule   BMI 32.0-32.9,adult  Obesity Challenges with meal prep and dietary choices, particularly during holidays and vacations. Gained 2.6 lbs in adipose mass and slightly decreased muscle mass. Discussed strategies for meal planning, including using [employer] prepared meats and portion control during holidays. Encouraged maintaining protein intake and mindful eating. Discussed the benefits of [employer] prepared meats: 150 calories per third of the package, 20 grams of protein, 5 grams of sugar, and 7 grams of carbs. Discussed strategies for Thanksgiving: portion control, mindful eating, and walking within 30 minutes of eating to prevent blood sugar spikes. - Recommend [employer] prepared meats for convenient meal options - Encourage portion control and mindful eating during holidays - Advise walking within 30 minutes of eating to prevent blood sugar spikes - Continue current exercise regimen including chair exercises and strength training with dumbbells - Consider gym visits for consistency  Type 2 DM: Lab Results  Component Value Date   HGBA1C 5.8 (H) 08/29/2022   HGBA1C 6.1 (H) 02/14/2022   HGBA1C 7.7 (H) 11/08/2021   Lab Results  Component Value Date   MICROALBUR 0.50 04/07/2013   LDLCALC 68 08/29/2022   CREATININE 0.90 08/29/2022   She is working  on nutrition plan to decrease  simple carbohydrates, increase lean proteins and exercise to promote weight loss and improve glycemic control . Currently on Mounjaro 7.5 mg  with occasional cramping and metformin every other day. . Discussed the importance of monitoring for side effects and maintaining regular lab work. - Refill/continue Mounjaro 7.5 mg weekly - Request recent lab results from new primary care provider - Monitor for any side effects or changes in symptoms   Vitamin D Deficiency Vitamin D is at goal of 50.  Most recent vitamin D level was 82.9. She is on  prescription ergocalciferol 50,000 IU every 14 days. No N/V or muscle weakness with Ergocalciferol.  Lab Results  Component Value Date   VD25OH 82.9 08/29/2022   VD25OH 42.2 02/14/2022   VD25OH 33.8 11/08/2021    Plan: Continue and refill  prescription ergocalciferol 50,000 IU every 14 days Low vitamin D levels can be associated with adiposity and may result in leptin resistance and weight gain. Also associated with fatigue. Currently on vitamin D supplementation without any adverse effects.  Recheck vitamin D level at next visit if not checked by new PCP at [facility].    General Health Maintenance Recently changed primary care provider- New PCP- [facility] and will have blood work done soon. - Request primary care provider to send recent lab results including vitamin D and A1c - Schedule follow-up appointment for December 18th at 9:30 AM.  Vitals Temp: 98 F (36.7 C) BP: 118/75 Pulse Rate: 84 SpO2: 97 %   Anthropometric Measurements Height: 5\' 5"  (1.651 m) Weight: 195 lb (88.5 kg) BMI (Calculated): 32.45 Weight at Last Visit: 194 lb Weight Lost Since Last Visit: 0 Weight Gained Since Last Visit: 1 lb Starting Weight: 214 lb Total Weight Loss (lbs): 19 lb (8.618 kg) Peak Weight: 223 lb   Body Composition  Body Fat %: 44.7 % Fat Mass (lbs): 87.6 lbs Muscle Mass (lbs): 102.8 lbs Total Body Water (lbs): 79.8 lbs Visceral Fat Rating : 13   Other Clinical Data Fasting: no Labs: no Today's Visit #: 16 Starting Date: 11/08/21     ASSESSMENT AND  PLAN:  Diet: Jessica Mendoza is currently in the action stage of change. As such, her goal is to continue with weight loss efforts. She has agreed to Category 1 Plan.  Exercise: Jessica Mendoza has been instructed to work up to a goal of 150 minutes of combined cardio and strengthening exercise per week for weight loss and overall health benefits.   Behavior Modification:  We discussed the following Behavioral Modification Strategies today: increasing lean protein intake, decreasing simple carbohydrates, increasing vegetables, increase H2O intake, increase high fiber foods, no skipping meals, meal planning and cooking strategies, holiday eating strategies, and planning for success. We discussed various medication options to help Jessica Mendoza with her weight loss efforts and we both agreed to continue Mounjaro 7.5 mg weekly for Type 2 diabetes management.  Return in about 4 weeks (around 01/30/2023).Marland Kitchen She was informed of the importance of frequent follow up visits to maximize her success with intensive lifestyle modifications for her multiple health conditions.  Attestation Statements:   Reviewed by clinician on day of visit: allergies, medications, problem list, medical history, surgical history, family history, social history, and previous encounter notes.   Time spent on visit including pre-visit chart review and post-visit care and charting was 47 minutes.    Vasily Fedewa, PA-C

## 2023-01-12 LAB — LAB REPORT - SCANNED
A1c: 5.8
HM Hepatitis Screen: NEGATIVE

## 2023-01-24 ENCOUNTER — Other Ambulatory Visit: Payer: Self-pay | Admitting: Hematology and Oncology

## 2023-01-30 ENCOUNTER — Encounter (INDEPENDENT_AMBULATORY_CARE_PROVIDER_SITE_OTHER): Payer: Self-pay | Admitting: Physician Assistant

## 2023-01-30 ENCOUNTER — Ambulatory Visit (INDEPENDENT_AMBULATORY_CARE_PROVIDER_SITE_OTHER): Payer: Medicare Other | Admitting: Physician Assistant

## 2023-01-30 VITALS — BP 121/77 | HR 82 | Temp 98.4°F | Ht 65.0 in | Wt 196.0 lb

## 2023-01-30 DIAGNOSIS — E1159 Type 2 diabetes mellitus with other circulatory complications: Secondary | ICD-10-CM

## 2023-01-30 DIAGNOSIS — E669 Obesity, unspecified: Secondary | ICD-10-CM | POA: Diagnosis not present

## 2023-01-30 DIAGNOSIS — Z6832 Body mass index (BMI) 32.0-32.9, adult: Secondary | ICD-10-CM

## 2023-01-30 DIAGNOSIS — E559 Vitamin D deficiency, unspecified: Secondary | ICD-10-CM | POA: Diagnosis not present

## 2023-01-30 DIAGNOSIS — E1169 Type 2 diabetes mellitus with other specified complication: Secondary | ICD-10-CM

## 2023-01-30 DIAGNOSIS — I152 Hypertension secondary to endocrine disorders: Secondary | ICD-10-CM | POA: Diagnosis not present

## 2023-01-30 DIAGNOSIS — Z7985 Long-term (current) use of injectable non-insulin antidiabetic drugs: Secondary | ICD-10-CM

## 2023-01-30 MED ORDER — TIRZEPATIDE 7.5 MG/0.5ML ~~LOC~~ SOAJ
7.5000 mg | SUBCUTANEOUS | 0 refills | Status: DC
Start: 2023-01-30 — End: 2023-02-28

## 2023-01-30 MED ORDER — VITAMIN D (ERGOCALCIFEROL) 1.25 MG (50000 UNIT) PO CAPS
50000.0000 [IU] | ORAL_CAPSULE | ORAL | 0 refills | Status: DC
Start: 2023-01-30 — End: 2023-02-28

## 2023-01-30 NOTE — Progress Notes (Unsigned)
SUBJECTIVE:  Chief Complaint: Obesity Discussed the use of AI scribe software for clinical note transcription with the patient, who gave verbal consent to proceed.  History of Present Illness     Interim History: Jessica Mendoza is up 1 lb since her last visit.  Down 18 lbs overall over the past year.  TBW loss of 8.4% Down 27 lbs from peak weight of 223 lbs.  Total weight loss from peak of 12.15% Jessica Mendoza, a 67 year old patient with a history of type 2 diabetes, vitamin D deficiency, hypertension, and hypothyroidism, presents for a follow-up visit regarding her obesity treatment plan. The patient reports adherence to her prescribed medications, including Mounjaro, and has not experienced any adverse effects.  She has noticed a decrease in her appetite, which she attributes to the [facility]. Despite this, she acknowledges that her dietary choices could be improved. She reports that starting the day with a healthy breakfast typically leads to healthier choices throughout the day.  The patient has also incorporated regular physical activity into her routine, including calf raises, squats, push-ups, deadlifts, arm presses, and tricep exercises. She has had an increase in muscle mass and a decrease in adipose tissue, suggesting a positive response to her obesity treatment plan/strength training.  The patient's recent lab results per [facility] revealed slightly elevated LDL levels, prompting a discussion about potential new treatments for cholesterol.  Her parathyroid hormone levels were also found to be elevated, although her calcium levels remain within the normal range. The patient is awaiting the results of her recent hemoglobin A1c test.  In summary, the patient is actively engaged in her obesity treatment plan, incorporating dietary changes and regular physical activity. She is adherent to her prescribed medications and is awaiting the results of recent lab work. Despite some  challenges, she remains motivated and committed to improving her health.  Jessica Mendoza is here to discuss her progress with her obesity treatment plan. She is on the Category 1 Plan and states she is following her eating plan approximately 30 % of the time. She states she is exercising , calf raises, squats, dead lifts with weights, strengthening exercises and resistance bands 10 minutes 3 times per week.   OBJECTIVE: Visit Diagnoses: Problem List Items Addressed This Visit     Diabetes mellitus, type 2 (HCC) - Primary   Relevant Medications   tirzepatide (MOUNJARO) 7.5 MG/0.5ML Pen   Generalized obesity   Relevant Medications   tirzepatide (MOUNJARO) 7.5 MG/0.5ML Pen   Vitamin D deficiency   Relevant Medications   Vitamin D, Ergocalciferol, (DRISDOL) 1.25 MG (50000 UNIT) CAPS capsule   Hypertension associated with type 2 diabetes mellitus (HCC)   Relevant Medications   tirzepatide (MOUNJARO) 7.5 MG/0.5ML Pen   BMI 32.0-32.9,adult  Obesity 67 year old Jessica Mendoza reports positive progress on Mounjaro with muscle gain and adipose tissue loss. Appetite is well-controlled, and she engages in regular physical activity. Emphasized the importance of a protein-rich breakfast to support muscle mass. No need to increase Mounjaro dosage unless A1c results indicate otherwise. - Refill Mounjaro - Continue current exercise regimen - Encourage starting the day with a protein-rich meal or smoothie  Type 2 Diabetes Mellitus Hemoglobin A1c results are pending from PCP. Labs from [facility]:  Lab Results  Component Value Date   HGBA1C 5.8 (H) 08/29/2022   HGBA1C 6.1 (H) 02/14/2022   HGBA1C 7.7 (H) 11/08/2021   Lab Results  Component Value Date   MICROALBUR 0.50 04/07/2013   LDLCALC 68 08/29/2022   CREATININE  0.90 08/29/2022    Diabetes is managed with Mounjaro and dietary modifications. No significant issues with blood sugar control noted. Emphasized the importance of obtaining A1c results for  appropriate management. - Obtain hemoglobin A1c results from [facility] Continue/refill Mounjaro 7.5 mg weekly  She is working  on nutrition plan to decrease simple carbohydrates, increase lean proteins and exercise to promote weight loss and improve glycemic control .  Hypertension Hypertension management was implied to be ongoing. - Continue current hypertension management plan  Hypothyroidism Hypothyroidism management was implied to be ongoing. - Continue current hypothyroidism management plan  Vitamin D Deficiency Vitamin D Deficiency Vitamin D is at goal of 50.  Most recent vitamin D level was 82.9 but reports recent level done at PCP. She is on  prescription ergocalciferol 50,000 IU every 14 days. Reports no side effect of N/V or muscle weakness with Ergocalciferol.  Lab Results  Component Value Date   VD25OH 82.9 08/29/2022   VD25OH 42.2 02/14/2022   VD25OH 33.8 11/08/2021    Plan: Continue and refill  prescription ergocalciferol 50,000 IU every 14 days Recheck vitamin D level next visit if no result from PCP.  Low vitamin D levels can be associated with adiposity and may result in leptin resistance and weight gain. Also associated with fatigue. Currently on vitamin D supplementation without any adverse effects.  Currently on vitamin D supplementation with no adverse effects reported. Discussed the need to check if [facility] performed a vitamin D level test. - Check if [facility] performed a vitamin D level test  General Health Maintenance Advised to discuss new cholesterol treatments with primary care provider due to slightly elevated LDL levels. Emphasized aggressive lipid management with a goal of LDL <70, ideally <55. BMP was normal, indicating no signs of heart failure. Parathyroid hormone is elevated, but calcium levels are normal. Discussed potential hereditary risk of aortic aneurysm due to family history. - Discuss new cholesterol treatments with  primary care provider - Monitor parathyroid hormone levels  Follow-up - Follow-up appointment on February 26, 2022, at 2:00 PM.  Vitals Temp: 98.4 F (36.9 C) BP: 121/77 Pulse Rate: 82 SpO2: 98 %   Anthropometric Measurements Height: 5\' 5"  (1.651 m) Weight: 196 lb (88.9 kg) BMI (Calculated): 32.62 Weight at Last Visit: 196 lb Weight Lost Since Last Visit: 0 Weight Gained Since Last Visit: 1 lb Starting Weight: 214 lb Total Weight Loss (lbs): 18 lb (8.165 kg) Peak Weight: 223 lb   Body Composition  Body Fat %: 44.1 % Fat Mass (lbs): 86.8 lbs Muscle Mass (lbs): 104.4 lbs Total Body Water (lbs): 80.2 lbs Visceral Fat Rating : 13   Other Clinical Data Fasting: no Labs: no Today's Visit #: 17 Starting Date: 11/08/21     ASSESSMENT AND PLAN:  Diet: Jessica Mendoza is currently in the action stage of change. As such, her goal is to continue with weight loss efforts. She has agreed to Category 1 Plan.  Exercise: Jessica Mendoza has been instructed to work up to a goal of 150 minutes of combined cardio and strengthening exercise per week for weight loss and overall health benefits.   Behavior Modification:  We discussed the following Behavioral Modification Strategies today: increasing lean protein intake, decreasing simple carbohydrates, increasing vegetables, increase H2O intake, increase high fiber foods, no skipping meals, holiday eating strategies, and planning for success. We discussed various medication options to help Jessica Mendoza with her weight loss efforts and we both agreed to continue Mounjaro 7.5 mg  weekly for Type 2 diabetes management.  No follow-ups on file.Marland Kitchen She was informed of the importance of frequent follow up visits to maximize her success with intensive lifestyle modifications for her multiple health conditions.  Attestation Statements:   Reviewed by clinician on day of visit: allergies, medications, problem list, medical history, surgical history, family history,  social history, and previous encounter notes.   Time spent on visit including pre-visit chart review and post-visit care and charting was 33 minutes.    Mckinnon Glick, PA-C

## 2023-02-27 ENCOUNTER — Telehealth (INDEPENDENT_AMBULATORY_CARE_PROVIDER_SITE_OTHER): Payer: Self-pay | Admitting: Physician Assistant

## 2023-02-27 ENCOUNTER — Ambulatory Visit (INDEPENDENT_AMBULATORY_CARE_PROVIDER_SITE_OTHER): Payer: Medicare Other | Admitting: Physician Assistant

## 2023-02-27 NOTE — Telephone Encounter (Signed)
 Pt has to r/s her 1/15 appt due to Shawn being out of office and the pt needs a refill on Mounjaro  7.5 and Vitamin D2. Pt's next appt is on 1/29 and the pt states she will run out of the medicine before then. Please follow up with the pt.

## 2023-02-28 ENCOUNTER — Other Ambulatory Visit (INDEPENDENT_AMBULATORY_CARE_PROVIDER_SITE_OTHER): Payer: Self-pay

## 2023-02-28 ENCOUNTER — Encounter (INDEPENDENT_AMBULATORY_CARE_PROVIDER_SITE_OTHER): Payer: Self-pay

## 2023-02-28 ENCOUNTER — Telehealth (INDEPENDENT_AMBULATORY_CARE_PROVIDER_SITE_OTHER): Payer: Self-pay

## 2023-02-28 DIAGNOSIS — E1169 Type 2 diabetes mellitus with other specified complication: Secondary | ICD-10-CM

## 2023-02-28 DIAGNOSIS — E559 Vitamin D deficiency, unspecified: Secondary | ICD-10-CM

## 2023-02-28 MED ORDER — TIRZEPATIDE 7.5 MG/0.5ML ~~LOC~~ SOAJ: 7.5000 mg | SUBCUTANEOUS | 0 refills | Status: DC

## 2023-02-28 MED ORDER — VITAMIN D (ERGOCALCIFEROL) 1.25 MG (50000 UNIT) PO CAPS: 50000.0000 [IU] | ORAL_CAPSULE | ORAL | 0 refills | Status: DC

## 2023-02-28 NOTE — Telephone Encounter (Signed)
LAST APPOINTMENT DATE: 01/30/23 NEXT APPOINTMENT DATE: 03/13/23   [facility]) - [place], [place] - [address] [facility] [address] [place] [address] Phone: [phone] Fax: [phone]  [facility] Delivery - [place], [place] - [address] [place]  [address] Phone: [phone] Fax: [phone]  [facility] #[id number] [place], [place] - [address] [place] [address] Phone: [phone] Fax: [phone]  [facility] - [place], [place] - [address] [place] [address] Phone: [phone] Fax: [phone]  Patient is requesting a refill of the following medications: Requested Prescriptions    No prescriptions requested or ordered in this encounter    Date last filled: 01/30/23 Previously prescribed by Shawn Rayburn  Lab Results  Component Value Date   HGBA1C 5.8 (H) 08/29/2022   HGBA1C 6.1 (H) 02/14/2022   HGBA1C 7.7 (H) 11/08/2021   Lab Results  Component Value Date   MICROALBUR 0.50 04/07/2013   LDLCALC 68 08/29/2022   CREATININE 0.90 08/29/2022   Lab Results  Component Value Date   VD25OH 82.9 08/29/2022   VD25OH 42.2 02/14/2022   VD25OH 33.8 11/08/2021    BP Readings from Last 3 Encounters:  01/30/23 121/77  01/02/23 118/75  11/28/22 128/72

## 2023-03-04 ENCOUNTER — Other Ambulatory Visit (INDEPENDENT_AMBULATORY_CARE_PROVIDER_SITE_OTHER): Payer: Self-pay | Admitting: Physician Assistant

## 2023-03-04 DIAGNOSIS — E1169 Type 2 diabetes mellitus with other specified complication: Secondary | ICD-10-CM

## 2023-03-04 MED ORDER — TIRZEPATIDE 7.5 MG/0.5ML ~~LOC~~ SOAJ
7.5000 mg | SUBCUTANEOUS | 0 refills | Status: DC
Start: 2023-03-04 — End: 2023-03-13

## 2023-03-13 ENCOUNTER — Ambulatory Visit (INDEPENDENT_AMBULATORY_CARE_PROVIDER_SITE_OTHER): Payer: Medicare Other | Admitting: Family Medicine

## 2023-03-13 ENCOUNTER — Encounter (INDEPENDENT_AMBULATORY_CARE_PROVIDER_SITE_OTHER): Payer: Self-pay | Admitting: Family Medicine

## 2023-03-13 VITALS — BP 134/84 | HR 74 | Temp 98.4°F | Ht 65.0 in | Wt 195.0 lb

## 2023-03-13 DIAGNOSIS — R7989 Other specified abnormal findings of blood chemistry: Secondary | ICD-10-CM | POA: Diagnosis not present

## 2023-03-13 DIAGNOSIS — Z7985 Long-term (current) use of injectable non-insulin antidiabetic drugs: Secondary | ICD-10-CM

## 2023-03-13 DIAGNOSIS — E559 Vitamin D deficiency, unspecified: Secondary | ICD-10-CM

## 2023-03-13 DIAGNOSIS — E1169 Type 2 diabetes mellitus with other specified complication: Secondary | ICD-10-CM | POA: Diagnosis not present

## 2023-03-13 DIAGNOSIS — Z6832 Body mass index (BMI) 32.0-32.9, adult: Secondary | ICD-10-CM

## 2023-03-13 DIAGNOSIS — E669 Obesity, unspecified: Secondary | ICD-10-CM

## 2023-03-13 MED ORDER — TIRZEPATIDE 7.5 MG/0.5ML ~~LOC~~ SOAJ
7.5000 mg | SUBCUTANEOUS | 0 refills | Status: DC
Start: 2023-03-13 — End: 2023-04-10

## 2023-03-13 NOTE — Progress Notes (Unsigned)
   SUBJECTIVE:  Chief Complaint: Obesity  Interim History: Patient here for follow up.  She normally sees [place].  She missed 1 week of Mounjaro since last appointment due to delay in prescription being sent in.  She is taking fiber, prune juice to keep regular.  Noticing decrease in ability to get all food in.  Adherence was 50% over the holidays.  She is back on track.   She is doing chair exercises 3-4 times a week with dumbbells.   Velna Hatchet is here to discuss her progress with her obesity treatment plan. She is on the Category 1 Plan and states she is following her eating plan approximately 50 % of the time. She states she is exercising 30 minutes 3-4 times per week.   OBJECTIVE: Visit Diagnoses: Problem List Items Addressed This Visit   None   Vitals Temp: 98.4 F (36.9 C) BP: 134/84 Pulse Rate: 74 SpO2: 99 %   Anthropometric Measurements Height: 5\' 5"  (1.651 m) Weight: 195 lb (88.5 kg) BMI (Calculated): 32.45 Weight at Last Visit: 196 lb Weight Lost Since Last Visit: 1 Weight Gained Since Last Visit: 0 Starting Weight: 214 lb Total Weight Loss (lbs): 19 lb (8.618 kg) Peak Weight: 223 lb   Body Composition  Body Fat %: 44.3 % Fat Mass (lbs): 86.8 lbs Muscle Mass (lbs): 103.4 lbs Total Body Water (lbs): 79.4 lbs Visceral Fat Rating : 13   Other Clinical Data Fasting: no Labs: no Today's Visit #: 18 Starting Date: 11/08/21     ASSESSMENT AND PLAN:  Diet: Velna Hatchet is currently in the action stage of change. As such, her goal is to continue with weight loss efforts. She has agreed to Category 1 Plan.  Exercise: Velna Hatchet has been instructed to continue exercising as is for weight loss and overall health benefits.   Behavior Modification:  We discussed the following Behavioral Modification Strategies today: increasing lean protein intake, increasing vegetables, no skipping meals, and planning for success.   No follow-ups on file.Marland Kitchen She was informed of the  importance of frequent follow up visits to maximize her success with intensive lifestyle modifications for her multiple health conditions.  Attestation Statements:   Reviewed by clinician on day of visit: allergies, medications, problem list, medical history, surgical history, family history, social history, and previous encounter notes.     Reuben Likes, MD

## 2023-03-13 NOTE — Assessment & Plan Note (Signed)
PTH level at 124 on last lab test with PCP.

## 2023-03-14 LAB — COMPREHENSIVE METABOLIC PANEL
ALT: 19 [IU]/L (ref 0–32)
AST: 16 [IU]/L (ref 0–40)
Albumin: 4.4 g/dL (ref 3.9–4.9)
Alkaline Phosphatase: 102 [IU]/L (ref 44–121)
BUN/Creatinine Ratio: 11 — ABNORMAL LOW (ref 12–28)
BUN: 8 mg/dL (ref 8–27)
Bilirubin Total: 0.5 mg/dL (ref 0.0–1.2)
CO2: 25 mmol/L (ref 20–29)
Calcium: 8.9 mg/dL (ref 8.7–10.3)
Chloride: 101 mmol/L (ref 96–106)
Creatinine, Ser: 0.75 mg/dL (ref 0.57–1.00)
Globulin, Total: 2.4 g/dL (ref 1.5–4.5)
Glucose: 89 mg/dL (ref 70–99)
Potassium: 3.5 mmol/L (ref 3.5–5.2)
Sodium: 142 mmol/L (ref 134–144)
Total Protein: 6.8 g/dL (ref 6.0–8.5)
eGFR: 87 mL/min/{1.73_m2} (ref >59)

## 2023-03-14 LAB — PTH, INTACT AND CALCIUM: PTH: 71 pg/mL — ABNORMAL HIGH (ref 15–65)

## 2023-03-14 LAB — VITAMIN D 25 HYDROXY (VIT D DEFICIENCY, FRACTURES): Vit D, 25-Hydroxy: 53.8 ng/mL (ref 30.0–100.0)

## 2023-03-21 ENCOUNTER — Telehealth: Payer: Self-pay | Admitting: *Deleted

## 2023-03-21 NOTE — Assessment & Plan Note (Signed)
 Patient is doing well on combination of Mounjaro  as well as metformin .  Her A1c at last lab draw was 6.1.  She is not experiencing much in terms of GI side effects of Mounjaro .  Will continue Mounjaro  at same dose 7.5 mg weekly as well as the same dose of metformin .  At this time patient is experiencing good satiation and is still able to get all food from meal plan in.  Refills for Mounjaro  was sent in.

## 2023-03-21 NOTE — Telephone Encounter (Signed)
 Per MD request RN placed call to pt with results of recent bone density showing T-Score 0.4.  Pt educated and verbalized understanding.

## 2023-03-21 NOTE — Assessment & Plan Note (Signed)
 Starting weight of 214 pounds on November 08, 2021 with a resting metabolic rate of 8530 and a body fat percentage of 46.9%.  Patient has been monitoring her dietary intake in terms of her calories as well as macronutrient composition.  She is currently on Mounjaro  for treatment of her type 2 diabetes with a cross cover of also covering her obstructive sleep apnea.  She has done well in seeing an improvement in her hemoglobin A1c.  Her goal for the next few weeks is to focus on lean protein intake increase, increasing vegetables, and decreasing simple carbohydrates.  Patient may be appropriate for repeat resting metabolic rate testing via indirect calorimetry in the near future to reassess her nutritional needs.

## 2023-03-21 NOTE — Assessment & Plan Note (Signed)
 Last vitamin D  level was below goal.  She has been doing well on prescription strength vitamin D  and then noticing an improvement in her fatigue.  Will recheck vitamin D  level today with other labs.

## 2023-03-22 ENCOUNTER — Encounter: Payer: Self-pay | Admitting: Hematology and Oncology

## 2023-04-10 ENCOUNTER — Encounter (INDEPENDENT_AMBULATORY_CARE_PROVIDER_SITE_OTHER): Payer: Self-pay | Admitting: Family Medicine

## 2023-04-10 ENCOUNTER — Ambulatory Visit (INDEPENDENT_AMBULATORY_CARE_PROVIDER_SITE_OTHER): Payer: Medicare Other | Admitting: Family Medicine

## 2023-04-10 VITALS — BP 123/76 | HR 72 | Temp 98.1°F | Ht 65.0 in | Wt 197.0 lb

## 2023-04-10 DIAGNOSIS — E559 Vitamin D deficiency, unspecified: Secondary | ICD-10-CM | POA: Diagnosis not present

## 2023-04-10 DIAGNOSIS — E119 Type 2 diabetes mellitus without complications: Secondary | ICD-10-CM

## 2023-04-10 DIAGNOSIS — Z7985 Long-term (current) use of injectable non-insulin antidiabetic drugs: Secondary | ICD-10-CM

## 2023-04-10 DIAGNOSIS — R7989 Other specified abnormal findings of blood chemistry: Secondary | ICD-10-CM

## 2023-04-10 DIAGNOSIS — E669 Obesity, unspecified: Secondary | ICD-10-CM

## 2023-04-10 DIAGNOSIS — Z6832 Body mass index (BMI) 32.0-32.9, adult: Secondary | ICD-10-CM

## 2023-04-10 DIAGNOSIS — E1165 Type 2 diabetes mellitus with hyperglycemia: Secondary | ICD-10-CM

## 2023-04-10 DIAGNOSIS — E1169 Type 2 diabetes mellitus with other specified complication: Secondary | ICD-10-CM

## 2023-04-10 MED ORDER — TIRZEPATIDE 7.5 MG/0.5ML ~~LOC~~ SOAJ
7.5000 mg | SUBCUTANEOUS | 0 refills | Status: AC
Start: 2023-04-10 — End: ?

## 2023-04-10 NOTE — Assessment & Plan Note (Signed)
 Anthropometric Measurements Height: 5\' 5"  (1.651 m) Weight: 197 lb (89.4 kg) BMI (Calculated): 32.78 Weight at Last Visit: 195 lb Weight Lost Since Last Visit: 0 Weight Gained Since Last Visit: 2 Starting Weight: 214 lb Total Weight Loss (lbs): 17 lb (7.711 kg) Peak Weight: 223 lb  Other Clinical Data Today's Visit #: 19 Starting Date: 11/08/21 Comments: Cat 1

## 2023-04-10 NOTE — Assessment & Plan Note (Signed)
 Patient lab from last appointment discussed today.  She is at goal at level of 53.  She will continue every other week prescription strength Vitamin D.  She does not need a refill today.

## 2023-04-10 NOTE — Assessment & Plan Note (Signed)
 She is doing well on Mounjaro 7.5mg  weekly.  She is not experiencing any side effects of her current medication dosage.  Needs refill today.

## 2023-04-10 NOTE — Assessment & Plan Note (Signed)
 Repeat PTH at 71 which is close to half the prior level of over 120.  She will need repeat PTH in 3 months.

## 2023-04-10 NOTE — Progress Notes (Signed)
   SUBJECTIVE:  Chief Complaint: Obesity  Interim History: Patient has been eating girl scout cookies more the last few weeks.  The cookies are gone now to help take away her temptations.  She is participating in a wellness challenge that has many different facets like mental, spiritual, physical.  She mentions everyone at work wants to keep up with it.  She realizes she needs to move more during the day.  She thinks she is doing well getting food that she likes in.  She is getting beans and vegetables in thru juice.   Jessica Mendoza is here to discuss her progress with her obesity treatment plan. She is on the Category 1 Plan and states she is following her eating plan approximately 50 % of the time. She states she is exercising 15-20 minutes 5 times per week.   OBJECTIVE: Visit Diagnoses: Problem List Items Addressed This Visit       Endocrine   Diabetes mellitus, type 2 (HCC)    >>ASSESSMENT AND PLAN FOR TYPE 2 DIABETES MELLITUS WITH HYPERGLYCEMIA (HCC) WRITTEN ON 04/10/2023 11:17 AM BY Reuben Likes U, MD  She is doing well on Mounjaro 7.5mg  weekly.  She is not experiencing any side effects of her current medication dosage.  Needs refill today.      Relevant Medications   tirzepatide (MOUNJARO) 7.5 MG/0.5ML Pen     Other   Generalized obesity   Relevant Medications   tirzepatide (MOUNJARO) 7.5 MG/0.5ML Pen   Vitamin D deficiency - Primary   Patient lab from last appointment discussed today.  She is at goal at level of 53.  She will continue every other week prescription strength Vitamin D.  She does not need a refill today.      BMI 32.0-32.9,adult   Elevated PTHrP level   Repeat PTH at 71 which is close to half the prior level of over 120.  She will need repeat PTH in 3 months.       Vitals Temp: 98.1 F (36.7 C) BP: 123/76 Pulse Rate: 72 SpO2: 100 %    Body Composition  Body Fat %: 44.9 % Fat Mass (lbs): 88.8 lbs Muscle Mass (lbs): 103.2 lbs Total Body Water  (lbs): 79.6 lbs Visceral Fat Rating : 13     ASSESSMENT AND PLAN:  Diet: Jessica Mendoza is currently in the action stage of change. As such, her goal is to continue with weight loss efforts and has agreed to the Category 1 Plan.   Exercise:  Older adults should determine their level of effort for physical activity relative to their level of fitness.  Patient to focus on NEAT- no exercise active time daily.  Behavior Modification:  We discussed the following Behavioral Modification Strategies today: increasing lean protein intake, increasing vegetables, meal planning and cooking strategies, and keep a strict food journal.   No follow-ups on file.Marland Kitchen She was informed of the importance of frequent follow up visits to maximize her success with intensive lifestyle modifications for her multiple health conditions.  Attestation Statements:   Reviewed by clinician on day of visit: allergies, medications, problem list, medical history, surgical history, family history, social history, and previous encounter notes.     Reuben Likes, MD

## 2023-04-10 NOTE — Assessment & Plan Note (Signed)
>>  ASSESSMENT AND PLAN FOR TYPE 2 DIABETES MELLITUS WITH HYPERGLYCEMIA (HCC) WRITTEN ON 04/10/2023 11:17 AM BY Reuben Likes U, MD  She is doing well on Mounjaro 7.5mg  weekly.  She is not experiencing any side effects of her current medication dosage.  Needs refill today.

## 2023-04-22 ENCOUNTER — Other Ambulatory Visit (INDEPENDENT_AMBULATORY_CARE_PROVIDER_SITE_OTHER): Payer: Self-pay | Admitting: Physician Assistant

## 2023-04-22 DIAGNOSIS — E559 Vitamin D deficiency, unspecified: Secondary | ICD-10-CM

## 2023-05-08 ENCOUNTER — Ambulatory Visit (INDEPENDENT_AMBULATORY_CARE_PROVIDER_SITE_OTHER): Payer: Medicare Other | Admitting: Family Medicine

## 2023-05-08 ENCOUNTER — Encounter (INDEPENDENT_AMBULATORY_CARE_PROVIDER_SITE_OTHER): Payer: Self-pay | Admitting: Family Medicine

## 2023-05-08 VITALS — BP 127/80 | HR 77 | Temp 98.1°F | Ht 65.0 in | Wt 194.0 lb

## 2023-05-08 DIAGNOSIS — Z7984 Long term (current) use of oral hypoglycemic drugs: Secondary | ICD-10-CM

## 2023-05-08 DIAGNOSIS — E559 Vitamin D deficiency, unspecified: Secondary | ICD-10-CM

## 2023-05-08 DIAGNOSIS — E119 Type 2 diabetes mellitus without complications: Secondary | ICD-10-CM | POA: Diagnosis not present

## 2023-05-08 DIAGNOSIS — E669 Obesity, unspecified: Secondary | ICD-10-CM | POA: Diagnosis not present

## 2023-05-08 DIAGNOSIS — Z6832 Body mass index (BMI) 32.0-32.9, adult: Secondary | ICD-10-CM

## 2023-05-08 DIAGNOSIS — E1165 Type 2 diabetes mellitus with hyperglycemia: Secondary | ICD-10-CM

## 2023-05-08 DIAGNOSIS — Z7985 Long-term (current) use of injectable non-insulin antidiabetic drugs: Secondary | ICD-10-CM

## 2023-05-08 MED ORDER — VITAMIN D (ERGOCALCIFEROL) 1.25 MG (50000 UNIT) PO CAPS: 50000.0000 [IU] | ORAL_CAPSULE | ORAL | 0 refills | Status: DC

## 2023-05-08 NOTE — Assessment & Plan Note (Signed)
 Doing well on Vitamin d prescription.  No nausea, vomiting or muscle weakness.  Needs refill today.

## 2023-05-08 NOTE — Progress Notes (Signed)
   SUBJECTIVE:  Chief Complaint: Obesity  Interim History: Patient has been trying to follow the meal plan as best as she could. She is estimating that she is 65% on getting three meals a day and eating on plan if she does get the 3 meals in. Sometime she skips the third meal because her lunch is so large she isn't hungry. She has experienced some hunger normally around midday after she has her boiled eggs and before she has her lunch.  Jessica Mendoza is here to discuss her progress with her obesity treatment plan. She is on the Category 1 Plan and states she is following her eating plan approximately 65-70 % of the time. She states she is exercising in the chair for 20 minutes 3 times per week.  She has been consistent with this.     OBJECTIVE: Visit Diagnoses: Problem List Items Addressed This Visit       Endocrine   Diabetes mellitus, type 2 (HCC) - Primary   Patient is on metformin and mounjaro and she is doing well.  No GI side effects; patient working on getting all food in daily.  Follow up on intake at next appointment.        Other   Generalized obesity   Vitamin D deficiency   Doing well on Vitamin d prescription.  No nausea, vomiting or muscle weakness.  Needs refill today.      Relevant Medications   Vitamin D, Ergocalciferol, (DRISDOL) 1.25 MG (50000 UNIT) CAPS capsule   BMI 32.0-32.9,adult    Vitals Temp: 98.1 F (36.7 C) BP: 127/80 Pulse Rate: 77 SpO2: 99 %   Anthropometric Measurements Height: 5\' 5"  (1.651 m) Weight: 194 lb (88 kg) BMI (Calculated): 32.28 Weight at Last Visit: 197 lb Weight Lost Since Last Visit: 3 lb Weight Gained Since Last Visit: 0 Starting Weight: 214 lb Total Weight Loss (lbs): 20 lb (9.072 kg) Peak Weight: 223 lb   Body Composition  Body Fat %: 43.8 % Fat Mass (lbs): 85.2 lbs Muscle Mass (lbs): 103.6 lbs Total Body Water (lbs): 77.6 lbs Visceral Fat Rating : 13   Other Clinical Data Fasting: no Labs: no Today's Visit #:  20 Starting Date: 11/08/21 Comments: cat 1     ASSESSMENT AND PLAN:  Diet: Jessica Mendoza is currently in the action stage of change. As such, her goal is to continue with weight loss efforts and has agreed to the Category 1 Plan.   Exercise:  Older adults should follow the adult guidelines. When older adults cannot meet the adult guidelines, they should be as physically active as their abilities and conditions will allow.  Patient is starting [employer] program about movement in adults that starts tomorrow.  Behavior Modification:  We discussed the following Behavioral Modification Strategies today: increasing lean protein intake, increasing vegetables, meal planning and cooking strategies, and planning for success.   Return in about 4 weeks (around 06/05/2023).Marland Kitchen She was informed of the importance of frequent follow up visits to maximize her success with intensive lifestyle modifications for her multiple health conditions.  Attestation Statements:   Reviewed by clinician on day of visit: allergies, medications, problem list, medical history, surgical history, family history, social history, and previous encounter notes.   Reuben Likes, MD

## 2023-05-08 NOTE — Assessment & Plan Note (Addendum)
 Patient is on metformin and mounjaro and she is doing well.  No GI side effects; patient working on getting all food in daily.  Follow up on intake at next appointment.

## 2023-06-19 ENCOUNTER — Ambulatory Visit (INDEPENDENT_AMBULATORY_CARE_PROVIDER_SITE_OTHER): Admitting: Family Medicine

## 2023-06-19 ENCOUNTER — Encounter (INDEPENDENT_AMBULATORY_CARE_PROVIDER_SITE_OTHER): Payer: Self-pay | Admitting: Family Medicine

## 2023-06-19 VITALS — BP 130/83 | HR 80 | Temp 98.4°F | Ht 65.0 in | Wt 195.0 lb

## 2023-06-19 DIAGNOSIS — E785 Hyperlipidemia, unspecified: Secondary | ICD-10-CM

## 2023-06-19 DIAGNOSIS — E1169 Type 2 diabetes mellitus with other specified complication: Secondary | ICD-10-CM | POA: Diagnosis not present

## 2023-06-19 DIAGNOSIS — E1165 Type 2 diabetes mellitus with hyperglycemia: Secondary | ICD-10-CM

## 2023-06-19 DIAGNOSIS — Z7985 Long-term (current) use of injectable non-insulin antidiabetic drugs: Secondary | ICD-10-CM

## 2023-06-19 DIAGNOSIS — E669 Obesity, unspecified: Secondary | ICD-10-CM

## 2023-06-19 DIAGNOSIS — Z6832 Body mass index (BMI) 32.0-32.9, adult: Secondary | ICD-10-CM

## 2023-06-19 DIAGNOSIS — I152 Hypertension secondary to endocrine disorders: Secondary | ICD-10-CM | POA: Diagnosis not present

## 2023-06-19 DIAGNOSIS — E039 Hypothyroidism, unspecified: Secondary | ICD-10-CM

## 2023-06-19 DIAGNOSIS — E1159 Type 2 diabetes mellitus with other circulatory complications: Secondary | ICD-10-CM

## 2023-06-19 DIAGNOSIS — Z7984 Long term (current) use of oral hypoglycemic drugs: Secondary | ICD-10-CM

## 2023-06-19 NOTE — Assessment & Plan Note (Signed)
 Recent A1c with PCP of 5.9.  Doing well on Mounjaro  7.5mg  and metformin .  Needs refill of Mounjaro  today.  Repeat labs in 6 months.

## 2023-06-19 NOTE — Assessment & Plan Note (Signed)
 TSH done by PCP on 06/07/23 and level of 1.24.  Patient stable on 25mcg of levothyroxine.

## 2023-06-19 NOTE — Assessment & Plan Note (Signed)
 Recent labs done with PCP showing total cholesterol of 163, Triglyceride 86, HDL 60 and LDL 87.  She is on a statin.  No myalgias reported.  Repeat labs in 6 months.

## 2023-06-19 NOTE — Progress Notes (Signed)
 SUBJECTIVE:  Chief Complaint: Obesity  Interim History: Patient had a celebratory trip to [place] for a wedding anniversary celebration.  She mentions that she went to [facility] and it was beautiful.  She has been slightly off plan with her recent trips.  She completed her plan to move program and that has shown her the importance of taking movement breaks throughout the day.  She does wonder if she perhaps overdid it in terms of her physical activity.  Jessica Mendoza is here to discuss her progress with her obesity treatment plan. She is on the Category 1 Plan and states she is following her eating plan approximately 75 % of the time. She states she is exercising 30 minutes 7 times per week.   OBJECTIVE: Visit Diagnoses: Problem List Items Addressed This Visit       Cardiovascular and Mediastinum   Hypertension associated with type 2 diabetes mellitus (HCC)   Blood pressure well controlled today.  No chest pain, chest pressure or headache.  Continue current medications at same doses.        Endocrine   Diabetes mellitus, type 2 (HCC) - Primary   Recent A1c with PCP of 5.9.  Doing well on Mounjaro  7.5mg  and metformin .  Needs refill of Mounjaro  today.  Repeat labs in 6 months.      Hyperlipidemia associated with type 2 diabetes mellitus (HCC)   Recent labs done with PCP showing total cholesterol of 163, Triglyceride 86, HDL 60 and LDL 87.  She is on a statin.  No myalgias reported.  Repeat labs in 6 months.      Hypothyroidism   TSH done by PCP on 06/07/23 and level of 1.24.  Patient stable on 25mcg of levothyroxine.        Other   Generalized obesity   BMI 32.0-32.9,adult    Vitals Temp: 98.4 F (36.9 C) BP: 130/83 Pulse Rate: 80 SpO2: 98 %   Anthropometric Measurements Height: 5\' 5"  (1.651 m) Weight: 195 lb (88.5 kg) BMI (Calculated): 32.45 Weight at Last Visit: 194 lb Weight Lost Since Last Visit: 0 Weight Gained Since Last Visit: 1 Starting Weight: 214  lb Total Weight Loss (lbs): 19 lb (8.618 kg) Peak Weight: 223 lb   Body Composition  Body Fat %: 41.9 % Fat Mass (lbs): 82 lbs Muscle Mass (lbs): 107.8 lbs Total Body Water (lbs): 77.2 lbs Visceral Fat Rating : 12   Other Clinical Data Today's Visit #: 21 Starting Date: 11/08/21 Comments: cat 1     ASSESSMENT AND PLAN:  Diet: Jessica Mendoza is currently in the action stage of change. As such, her goal is to continue with weight loss efforts and has agreed to the Category 1 Plan.   Exercise:  Older adults should follow the adult guidelines. When older adults cannot meet the adult guidelines, they should be as physically active as their abilities and conditions will allow.  Behavior Modification:  We discussed the following Behavioral Modification Strategies today: increasing lean protein intake, decreasing simple carbohydrates, increasing vegetables, meal planning and cooking strategies, keeping healthy foods in the home, and avoiding temptations.   Return in about 6 weeks (around 07/31/2023) for repeat IC.   She was informed of the importance of frequent follow up visits to maximize her success with intensive lifestyle modifications for her multiple health conditions.  Attestation Statements:   Reviewed by clinician on day of visit: allergies, medications, problem list, medical history, surgical history, family history, social history, and previous encounter notes.  Donaciano Frizzle, MD

## 2023-06-19 NOTE — Assessment & Plan Note (Addendum)
 Blood pressure well controlled today.  No chest pain, chest pressure or headache.  Continue current medications at same doses.

## 2023-06-26 ENCOUNTER — Ambulatory Visit: Payer: Medicare Other | Admitting: Hematology and Oncology

## 2023-06-26 ENCOUNTER — Inpatient Hospital Stay: Attending: Hematology and Oncology | Admitting: Hematology and Oncology

## 2023-06-26 VITALS — BP 122/82 | HR 80 | Temp 98.0°F | Resp 18 | Ht 65.0 in | Wt 201.6 lb

## 2023-06-26 DIAGNOSIS — M62830 Muscle spasm of back: Secondary | ICD-10-CM | POA: Insufficient documentation

## 2023-06-26 DIAGNOSIS — R232 Flushing: Secondary | ICD-10-CM | POA: Diagnosis not present

## 2023-06-26 DIAGNOSIS — Z79811 Long term (current) use of aromatase inhibitors: Secondary | ICD-10-CM | POA: Diagnosis not present

## 2023-06-26 DIAGNOSIS — Z7901 Long term (current) use of anticoagulants: Secondary | ICD-10-CM | POA: Insufficient documentation

## 2023-06-26 DIAGNOSIS — Z888 Allergy status to other drugs, medicaments and biological substances status: Secondary | ICD-10-CM | POA: Insufficient documentation

## 2023-06-26 DIAGNOSIS — E042 Nontoxic multinodular goiter: Secondary | ICD-10-CM | POA: Diagnosis not present

## 2023-06-26 DIAGNOSIS — C50411 Malignant neoplasm of upper-outer quadrant of right female breast: Secondary | ICD-10-CM | POA: Insufficient documentation

## 2023-06-26 DIAGNOSIS — E119 Type 2 diabetes mellitus without complications: Secondary | ICD-10-CM | POA: Diagnosis not present

## 2023-06-26 DIAGNOSIS — Z79899 Other long term (current) drug therapy: Secondary | ICD-10-CM | POA: Insufficient documentation

## 2023-06-26 DIAGNOSIS — Z170 Estrogen receptor positive status [ER+]: Secondary | ICD-10-CM | POA: Insufficient documentation

## 2023-06-26 DIAGNOSIS — Z1722 Progesterone receptor negative status: Secondary | ICD-10-CM | POA: Insufficient documentation

## 2023-06-26 NOTE — Progress Notes (Signed)
 Patient Care Team: Alica Antu, Billie Budge, MD as PCP - General (Family Medicine) Tasia Farr, MD (Endocrinology) Claudette Cue, MD (Inactive) (Gastroenterology) Bonita Bussing, MD (Obstetrics and Gynecology) Ayesha Lente, MD (Inactive) as Consulting Physician (General Surgery) Cameron Cea, MD as Consulting Physician (Hematology and Oncology) Colie Dawes, MD as Attending Physician (Radiation Oncology) Auther Bo, RN as Registered Nurse Alane Hsu, RN as Registered Nurse Alanna Alley, NP (Inactive) as Nurse Practitioner (Nurse Practitioner) Dama Duffel, NP as Nurse Practitioner (Nurse Practitioner)  DIAGNOSIS:  Encounter Diagnosis  Name Primary?   Malignant neoplasm of upper-outer quadrant of right breast in female, estrogen receptor positive (HCC) Yes    SUMMARY OF ONCOLOGIC HISTORY: Oncology History  Breast cancer of upper-outer quadrant of right female breast (HCC)  09/08/2014 Mammogram   Right breast: irregular mass with calcifications at 11:00, posterior depth.   09/15/2014 Breast US    Right breast: 1.6 cm irregular mass with indistinct margin at 11:00, posterior depth, 6 CFN. Related microcalcifications. Hypoechoic, correlates with mammographic findings.   09/16/2014 Initial Biopsy   Right breast biopsy: Invasive ductal carcinoma, grade 2, ER+ 100%, PR- 0%, HER-2 negative, Ki-67 10%   09/16/2014 Clinical Stage   Stage IA: T1c N0   09/23/2014 Procedure   Breast/Ovarian panel ([facility]) reveals no clinically signficant variant at ATM, BARD1, BRCA1, BRCA2, BRIP1, CDH1, CHEK2, EPCAM, FANCC, MLH1, MSH2, MSH6, NBN, PALB2, PMS2, PTEN, RAD51C, RAD51D, TP53, and XRCC2.    11/25/2014 Definitive Surgery   Right lumpectomy: IDC 1.7 cm Grade 2 with DCIS, 0/2 LN, ER+ 100, PR- 0%, HER 2 Neg    11/25/2014 Pathologic Stage   Stage IA: T1c N0   11/25/2014 Oncotype testing   Oncotype DX 18, 11% ROR   12/23/2014 - 01/28/2015 Radiation Therapy    Adjuvant RT: Right breast / 50 Gy in 25 fractions   03/02/2015 -  Anti-estrogen oral therapy   Anastrozole  1 mg daily 5 years   03/31/2015 Survivorship   Survivorship visit completed and copy of care plan given to patient     CHIEF COMPLIANT: Surveillance of breast cancer on letrozole therapy  HISTORY OF PRESENT ILLNESS: History of Present Illness Jessica Mendoza is a 68 year old female who presents for a follow-up visit.  She takes Anastrozole  for over eight years, experiencing hot flashes but maintaining excellent bone density. She has lost approximately twenty pounds since September 2023, feeling improved well-being. Her blood sugar levels are stable with an A1c of 5.9. A recent CT scan for her thyroid  and aorta showed no aneurysm, despite a family history of aneurysm.     ALLERGIES:  is allergic to crestor  [rosuvastatin ], fenofibrate, simvastatin, and statins.  MEDICATIONS:  Current Outpatient Medications  Medication Sig Dispense Refill   Alpha Lipoic Acid 200 MG CAPS Take 200 mg by mouth daily before breakfast.     amLODipine  (NORVASC ) 5 MG tablet Take 1 tablet (5 mg total) by mouth daily. 90 tablet 2   anastrozole  (ARIMIDEX ) 1 MG tablet TAKE 1 TABLET BY MOUTH DAILY 90 tablet 3   Biotin 1 MG CAPS 1 mg.     Biotin 5000 MCG CAPS Take 5,000 mcg by mouth at bedtime.     Coenzyme Q10 (CO Q 10) 100 MG CAPS Take 100 mg by mouth at bedtime.      COLLAGEN PO Take by mouth daily.     diclofenac Sodium (VOLTAREN) 1 % GEL SMARTSIG:1 sparingly Topical Every 8 Hours PRN     levothyroxine (SYNTHROID,  LEVOTHROID) 25 MCG tablet Take 25 mcg by mouth daily before breakfast.     metFORMIN  (GLUCOPHAGE -XR) 500 MG 24 hr tablet Take 1 tablet (500 mg total) by mouth daily with breakfast. NO MORE REFILLS WITHOUT OFFICE VISIT - 2ND NOTICE 15 tablet 0   methocarbamol (ROBAXIN) 500 MG tablet Take 500 mg by mouth.     metoprolol  tartrate (LOPRESSOR ) 50 MG tablet Take 50 mg by mouth daily.      ONETOUCH VERIO test strip CHECK BS BID  7   oxybutynin (DITROPAN-XL) 10 MG 24 hr tablet Take 1 tablet by mouth daily.     Pitavastatin  Magnesium (ZYPITAMAG ) 2 MG TABS TAKE 1 TABLET BY MOUTH EVERY DAY 30 tablet 8   rivaroxaban  (XARELTO ) 20 MG TABS tablet Take 1 tablet (20 mg total) by mouth daily with supper. 90 tablet 3   tirzepatide  (MOUNJARO ) 7.5 MG/0.5ML Pen Inject 7.5 mg into the skin once a week. 6 mL 0   Vitamin D , Ergocalciferol , (DRISDOL ) 1.25 MG (50000 UNIT) CAPS capsule Take 1 capsule (50,000 Units total) by mouth every 14 (fourteen) days. 5 capsule 0   celecoxib (CELEBREX) 200 MG capsule Take by mouth as needed. (Patient not taking: Reported on 06/26/2023)     naftifine (NAFTIN) 1 % cream 1 Application. (Patient not taking: Reported on 06/26/2023)     No current facility-administered medications for this visit.    PHYSICAL EXAMINATION: ECOG PERFORMANCE STATUS: 1 - Symptomatic but completely ambulatory  Vitals:   06/26/23 1500  BP: 122/82  Pulse: 80  Resp: 18  Temp: 98 F (36.7 C)  SpO2: 100%   Filed Weights   06/26/23 1500  Weight: 201 lb 9.6 oz (91.4 kg)    Physical Exam MEASUREMENTS: Weight- 201.  (exam performed in the presence of a chaperone)  LABORATORY DATA:  I have reviewed the data as listed    Latest Ref Rng & Units 03/13/2023    8:57 AM 08/29/2022    1:32 PM 06/22/2022    8:04 AM  CMP  Glucose 70 - 99 mg/dL 89  75    BUN 8 - 27 mg/dL 8  13    Creatinine 9.62 - 1.00 mg/dL 9.52  8.41  3.24   Sodium 134 - 144 mmol/L 142  143    Potassium 3.5 - 5.2 mmol/L 3.5  4.4    Chloride 96 - 106 mmol/L 101  102    CO2 20 - 29 mmol/L 25  25    Calcium  8.7 - 10.3 mg/dL 8.9  9.6    Total Protein 6.0 - 8.5 g/dL 6.8  7.4    Total Bilirubin 0.0 - 1.2 mg/dL 0.5  0.7    Alkaline Phos 44 - 121 IU/L 102  93    AST 0 - 40 IU/L 16  18    ALT 0 - 32 IU/L 19  21      Lab Results  Component Value Date   WBC 4.0 08/29/2022   HGB 12.4 08/29/2022   HCT 37.7 08/29/2022    MCV 81 08/29/2022   PLT 228 08/29/2022   NEUTROABS 1.9 08/29/2022    ASSESSMENT & PLAN:  Breast cancer of upper-outer quadrant of right female breast (HCC) Rt  Lumpectomy 11/25/14: IDC 1.7 cm Grade 2 with DCIS , 0/2 LN, ER 100, PR 0%, Her 2 Neg T1CN0 (Stage 1A) Oncotype DX 18, 11% risk of recurrence Adjuvant radiation therapy 12/23/2014 to 01/28/2015   Current treatment: Adjuvant antiestrogen therapy with anastrozole  1 mg daily  10 years started 03/02/2015   Anastrozole  toxicities: Denies any hot flashes or arthralgias or myalgias.   Survivorship: Encouraged her to continue to exercise and stay active.      Surveillance for breast cancer:  1. Breast exam 06/26/2023: Small nodule in the left chest wall area.    CT chest 09/19/2022: Negative 2. Mammograms  at [facility]: 11/28/2022: Benign breast density category B 3.  Bone density 11/09/2020: T score 0.5 (used to be 0.2): Normal   11/30/2021: CT chest: No suspicious nodules or masses.  1 cm left axillary lymph node probably benign.  Follow-up CT in 3 to 6 months.  Left thyroid  nodules, left adrenal gland thickening 06/26/2022: No suspicious masses.  8 mm left axillary lymph node unchanged   Thyroid  nodules: Previously it was biopsied in 2016 and was benign.  Last thyroid  ultrasound was 2019.  Will check it again with another thyroid  ultrasound to make sure there has not been any change.   Return to clinic in 1 year for follow-up Assessment & Plan Breast cancer Adjuvant therapy continued beyond usual duration due to good tolerance and excellent bone density. Hot flashes are the only side effect. Therapy extended to ten years for maximum benefit. - Continue adjuvant therapy for breast cancer.  Type 2 diabetes mellitus without complications Diabetes well-controlled with A1c of 5.9. - Continue current diabetes management plan.  Back spasms Spasms likely from manual watering after faucet break. Managed with nighttime muscle relaxer to prevent  daytime drowsiness. - Continue muscle relaxer as needed, primarily at night.      No orders of the defined types were placed in this encounter.  The patient has a good understanding of the overall plan. she agrees with it. she will call with any problems that may develop before the next visit here. Total time spent: 30 mins including face to face time and time spent for planning, charting and co-ordination of care   Viinay K Cameron Schwinn, MD 06/26/23

## 2023-06-26 NOTE — Assessment & Plan Note (Signed)
 Rt  Lumpectomy 11/25/14: IDC 1.7 cm Grade 2 with DCIS , 0/2 LN, ER 100, PR 0%, Her 2 Neg T1CN0 (Stage 1A) Oncotype DX 18, 11% risk of recurrence Adjuvant radiation therapy 12/23/2014 to 01/28/2015   Current treatment: Adjuvant antiestrogen therapy with anastrozole  1 mg daily 7 years started 03/02/2015   Anastrozole  toxicities: Denies any hot flashes or arthralgias or myalgias.   Survivorship: Encouraged her to continue to exercise and stay active.      Surveillance for breast cancer:  1. Breast exam 06/26/2023: Small nodule in the left chest wall area.    CT chest 09/19/2022: Negative 2. Mammograms  at [facility]: 11/28/2022: Benign breast density category B 3.  Bone density 11/09/2020: T score 0.5 (used to be 0.2): Normal   11/30/2021: CT chest: No suspicious nodules or masses.  1 cm left axillary lymph node probably benign.  Follow-up CT in 3 to 6 months.  Left thyroid  nodules, left adrenal gland thickening 06/26/2022: No suspicious masses.  8 mm left axillary lymph node unchanged   Thyroid  nodules: Previously it was biopsied in 2016 and was benign.  Last thyroid  ultrasound was 2019.  Will check it again with another thyroid  ultrasound to make sure there has not been any change.   Return to clinic in 1 year for follow-up

## 2023-07-03 ENCOUNTER — Encounter: Payer: Self-pay | Admitting: Podiatry

## 2023-07-03 ENCOUNTER — Ambulatory Visit (INDEPENDENT_AMBULATORY_CARE_PROVIDER_SITE_OTHER): Admitting: Podiatry

## 2023-07-03 DIAGNOSIS — E119 Type 2 diabetes mellitus without complications: Secondary | ICD-10-CM | POA: Diagnosis not present

## 2023-07-03 DIAGNOSIS — M79674 Pain in right toe(s): Secondary | ICD-10-CM

## 2023-07-03 DIAGNOSIS — B351 Tinea unguium: Secondary | ICD-10-CM | POA: Diagnosis not present

## 2023-07-03 DIAGNOSIS — M79675 Pain in left toe(s): Secondary | ICD-10-CM | POA: Diagnosis not present

## 2023-07-03 NOTE — Progress Notes (Signed)
   Chief Complaint  Patient presents with   [facility]    DFC, Nail trim. A1C 5.9. 0 pain.    SUBJECTIVE Patient with a history of diabetes mellitus presents to office today complaining of elongated, thickened nails that cause pain while ambulating in shoes.  Patient is unable to trim their own nails. Patient is here for further evaluation and treatment.  Past Medical History:  Diagnosis Date   Afib (HCC)    Arthritis    WRISTS AND NECK   Back pain    Benign positional vertigo    Bloating    Breast cancer (HCC) 2016   ER+/PR-/Her2-   Colon polyps 12/2005   Diabetes mellitus, type 2 (HCC) 06/2010   Edema    Essential hypertension, benign    Family history of hypertrophic cardiomyopathy    Family history of ovarian cancer    Family history of pancreatic cancer    Focal nodular hyperplasia of liver    bx 2002   Glaucoma    Goiter 10/2006   Hot flashes    Hyperlipidemia    Hypothyroidism    Joint pain    Migraines    menstrual   Obesity    OSA (obstructive sleep apnea)    Other and unspecified hyperlipidemia    Sleep apnea     Allergies  Allergen Reactions   Crestor  [Rosuvastatin ]     Other reaction(s): myalgias (2015)   Fenofibrate Other (See Comments)    myalgias   Simvastatin     Myalgias  ?? lipitor    Statins Other (See Comments)     OBJECTIVE General Patient is awake, alert, and oriented x 3 and in no acute distress. Derm Skin is dry and supple bilateral. Negative open lesions or macerations. Remaining integument unremarkable. Nails are tender, long, thickened and dystrophic with subungual debris, consistent with onychomycosis, 1-5 bilateral. No signs of infection noted. Vasc  DP and PT pedal pulses palpable bilaterally. Temperature gradient within normal limits.  Neuro Epicritic and protective threshold sensation diminished bilaterally.  Musculoskeletal Exam No symptomatic pedal deformities noted bilateral. Muscular strength within normal  limits.  ASSESSMENT 1. Diabetes Mellitus w/ peripheral neuropathy 2.  Pain due to onychomycosis of toenails bilateral  PLAN OF CARE 1. Patient evaluated today.  Comprehensive diabetic foot exam performed today 2. Instructed to maintain good pedal hygiene and foot care. Stressed importance of controlling blood sugar.  3. Mechanical debridement of nails 1-5 bilaterally performed using a nail nipper. Filed with dremel without incident.  4. Return to clinic in 3 mos.     Dot Gazella, DPM [facility]  Dr. Dot Gazella, DPM    [address] [place], [place] [address]                Office [phone]  Fax [phone]

## 2023-07-21 ENCOUNTER — Other Ambulatory Visit: Payer: Self-pay | Admitting: Physician Assistant

## 2023-07-30 ENCOUNTER — Other Ambulatory Visit: Payer: Self-pay | Admitting: Physician Assistant

## 2023-07-30 DIAGNOSIS — I480 Paroxysmal atrial fibrillation: Secondary | ICD-10-CM

## 2023-07-30 NOTE — Telephone Encounter (Signed)
 Prescription refill request for Xarelto  received.  Indication:afib Last office visit:7/24 Weight:91.4  kg Age:68 Scr:0.75  1/25 CrCl:103.59  ml/min  Prescription refilled

## 2023-07-31 ENCOUNTER — Encounter (INDEPENDENT_AMBULATORY_CARE_PROVIDER_SITE_OTHER): Payer: Self-pay | Admitting: Family Medicine

## 2023-07-31 ENCOUNTER — Ambulatory Visit (INDEPENDENT_AMBULATORY_CARE_PROVIDER_SITE_OTHER): Admitting: Family Medicine

## 2023-07-31 VITALS — BP 125/72 | HR 86 | Temp 98.7°F | Ht 65.0 in | Wt 194.0 lb

## 2023-07-31 DIAGNOSIS — E669 Obesity, unspecified: Secondary | ICD-10-CM

## 2023-07-31 DIAGNOSIS — Z6832 Body mass index (BMI) 32.0-32.9, adult: Secondary | ICD-10-CM

## 2023-07-31 DIAGNOSIS — E1169 Type 2 diabetes mellitus with other specified complication: Secondary | ICD-10-CM | POA: Diagnosis not present

## 2023-07-31 DIAGNOSIS — R0602 Shortness of breath: Secondary | ICD-10-CM | POA: Diagnosis not present

## 2023-07-31 DIAGNOSIS — E559 Vitamin D deficiency, unspecified: Secondary | ICD-10-CM | POA: Diagnosis not present

## 2023-07-31 DIAGNOSIS — E1165 Type 2 diabetes mellitus with hyperglycemia: Secondary | ICD-10-CM

## 2023-07-31 DIAGNOSIS — Z7985 Long-term (current) use of injectable non-insulin antidiabetic drugs: Secondary | ICD-10-CM

## 2023-07-31 DIAGNOSIS — E785 Hyperlipidemia, unspecified: Secondary | ICD-10-CM | POA: Diagnosis not present

## 2023-07-31 MED ORDER — TIRZEPATIDE 7.5 MG/0.5ML ~~LOC~~ SOAJ
7.5000 mg | SUBCUTANEOUS | 0 refills | Status: DC
Start: 2023-07-31 — End: 2023-10-09

## 2023-07-31 NOTE — Progress Notes (Unsigned)
 SUBJECTIVE:  Chief Complaint: Obesity  Interim History: Patient is trying to do 10 minute intervals throughout the day in terms of physical activity.  She is trying to be more mindful of ensuring she gets activity in daily.  She has been trying to follow the Category 1 but is doing more 2 meals and 1 snack daily.  She is doing boiled eggs and toast.  Lunch is whatever she has for dinner leftover and occasionally cheese and [place].  She will have a fruit.  Dinner is usually a meal of meat and vegetables.   Jessica Mendoza is here to discuss her progress with her obesity treatment plan. She is on the Category 1 Plan and states she is following her eating plan approximately 70 % of the time. She states she is exercising 20 minutes 7 times per week.   OBJECTIVE: Visit Diagnoses: Problem List Items Addressed This Visit       Endocrine   Diabetes mellitus, type 2 (HCC)   Relevant Medications   tirzepatide  (MOUNJARO ) 7.5 MG/0.5ML Pen   Other Relevant Orders   Comprehensive metabolic panel with GFR   Hemoglobin A1c   Insulin , random   Hyperlipidemia associated with type 2 diabetes mellitus (HCC)   Relevant Medications   tirzepatide  (MOUNJARO ) 7.5 MG/0.5ML Pen   Other Relevant Orders   Lipid Panel With LDL/HDL Ratio     Other   Generalized obesity   Relevant Medications   tirzepatide  (MOUNJARO ) 7.5 MG/0.5ML Pen   Vitamin D  deficiency   Relevant Orders   VITAMIN D  25 Hydroxy (Vit-D Deficiency, Fractures)   SOBOE (shortness of breath on exertion) - Primary   Resting metabolic rate of 5956 on 11/08/21 by indirect calorimetry.  Repeat IC done today with resting metabolic rate of 3875.  She can continue Category 1 plan with a substitution of different options for calorie and protein equivalent for lunch and dinner.      BMI 32.0-32.9,adult    Vitals Temp: 98.7 F (37.1 C) BP: 125/72 Pulse Rate: 86 SpO2: 99 %   Anthropometric Measurements Height: 5' 5 (1.651 m) Weight: 194 lb (88  kg) BMI (Calculated): 32.28 Weight at Last Visit: 195 lb Starting Weight: 214 lb Total Weight Loss (lbs): 20 lb (9.072 kg)   Body Composition  Body Fat %: 43.2 % Fat Mass (lbs): 83.8 lbs Muscle Mass (lbs): 104.6 lbs Total Body Water (lbs): 77.8 lbs Visceral Fat Rating : 12   Other Clinical Data RMR: 1368 Fasting: yes Labs: yes Today's Visit #: 22 Starting Date: 11/08/21 Comments: Cat 1     ASSESSMENT AND PLAN:  Diet: Jessica Mendoza is currently in the action stage of change. As such, her goal is to continue with weight loss efforts and has agreed to the Category 1 Plan.   Exercise:  Older adults should follow the adult guidelines. When older adults cannot meet the adult guidelines, they should be as physically active as their abilities and conditions will allow.  Behavior Modification:  We discussed the following Behavioral Modification Strategies today: increasing lean protein intake, decreasing simple carbohydrates, increasing vegetables, meal planning and cooking strategies, keeping healthy foods in the home, and planning for success.   Return in about 6 weeks (around 09/11/2023).   She was informed of the importance of frequent follow up visits to maximize her success with intensive lifestyle modifications for her multiple health conditions.  Attestation Statements:   Reviewed by clinician on day of visit: allergies, medications, problem list, medical history, surgical history, family history,  social history, and previous encounter notes.     Donaciano Frizzle, MD

## 2023-07-31 NOTE — Assessment & Plan Note (Signed)
 Resting metabolic rate of 1478 on 11/08/21 by indirect calorimetry.  Repeat IC done today with resting metabolic rate of 2956.  She can continue Category 1 plan with a substitution of different options for calorie and protein equivalent for lunch and dinner.

## 2023-08-01 LAB — VITAMIN D 25 HYDROXY (VIT D DEFICIENCY, FRACTURES): Vit D, 25-Hydroxy: 48.8 ng/mL (ref 30.0–100.0)

## 2023-08-01 LAB — COMPREHENSIVE METABOLIC PANEL WITH GFR
ALT: 20 IU/L (ref 0–32)
AST: 19 IU/L (ref 0–40)
Albumin: 4.6 g/dL (ref 3.9–4.9)
Alkaline Phosphatase: 105 IU/L (ref 44–121)
BUN/Creatinine Ratio: 14 (ref 12–28)
BUN: 13 mg/dL (ref 8–27)
Bilirubin Total: 0.5 mg/dL (ref 0.0–1.2)
CO2: 22 mmol/L (ref 20–29)
Calcium: 9.4 mg/dL (ref 8.7–10.3)
Chloride: 102 mmol/L (ref 96–106)
Creatinine, Ser: 0.93 mg/dL (ref 0.57–1.00)
Globulin, Total: 2.7 g/dL (ref 1.5–4.5)
Glucose: 75 mg/dL (ref 70–99)
Potassium: 4 mmol/L (ref 3.5–5.2)
Sodium: 143 mmol/L (ref 134–144)
Total Protein: 7.3 g/dL (ref 6.0–8.5)
eGFR: 67 mL/min/{1.73_m2} (ref >59)

## 2023-08-01 LAB — LIPID PANEL WITH LDL/HDL RATIO
Cholesterol, Total: 150 mg/dL (ref 100–199)
HDL: 61 mg/dL (ref >39)
LDL Chol Calc (NIH): 73 mg/dL (ref 0–99)
LDL/HDL Ratio: 1.2 ratio (ref 0.0–3.2)
Triglycerides: 86 mg/dL (ref 0–149)
VLDL Cholesterol Cal: 16 mg/dL (ref 5–40)

## 2023-08-01 LAB — INSULIN, RANDOM: INSULIN: 19.9 u[IU]/mL (ref 2.6–24.9)

## 2023-08-01 LAB — HEMOGLOBIN A1C
Est. average glucose Bld gHb Est-mCnc: 117 mg/dL
Hgb A1c MFr Bld: 5.7 % — ABNORMAL HIGH (ref 4.8–5.6)

## 2023-08-08 NOTE — Assessment & Plan Note (Signed)
 Anthropometric Measurements Height: 5' 5 (1.651 m) Weight: 194 lb (88 kg) BMI (Calculated): 32.28 Weight at Last Visit: 195 lb Starting Weight: 214 lb Total Weight Loss (lbs): 20 lb (9.072 kg) Body Composition  Body Fat %: 43.2 % Fat Mass (lbs): 83.8 lbs Muscle Mass (lbs): 104.6 lbs Total Body Water (lbs): 77.8 lbs Visceral Fat Rating : 12 Other Clinical Data RMR: 1368 Fasting: yes Labs: yes Today's Visit #: 22 Starting Date: 11/08/21 Comments: Cat 1

## 2023-08-08 NOTE — Assessment & Plan Note (Signed)
 On Mounjaro  weekly with minimal if any side effects.  Needs repeat labs today of CMP, A1c and Insulin  level today.  Needs a refill of her Mounjaro  today- no increase in dose.

## 2023-08-08 NOTE — Assessment & Plan Note (Signed)
 Needs a repeat FLP today to assess cholesterol management with lifestyle change.  Will discuss results at next appointment.

## 2023-08-08 NOTE — Assessment & Plan Note (Signed)
 On Vitamin D  supplement.  No nausea, vomiting or muscle weakness mentioned.  Retest Vitamin D  today to assess response to supplementation.

## 2023-09-11 ENCOUNTER — Ambulatory Visit (INDEPENDENT_AMBULATORY_CARE_PROVIDER_SITE_OTHER): Admitting: Family Medicine

## 2023-09-11 ENCOUNTER — Encounter (INDEPENDENT_AMBULATORY_CARE_PROVIDER_SITE_OTHER): Payer: Self-pay | Admitting: Family Medicine

## 2023-09-11 VITALS — BP 124/77 | HR 73 | Temp 98.1°F | Ht 65.0 in | Wt 198.0 lb

## 2023-09-11 DIAGNOSIS — Z7984 Long term (current) use of oral hypoglycemic drugs: Secondary | ICD-10-CM

## 2023-09-11 DIAGNOSIS — Z6832 Body mass index (BMI) 32.0-32.9, adult: Secondary | ICD-10-CM | POA: Diagnosis not present

## 2023-09-11 DIAGNOSIS — E669 Obesity, unspecified: Secondary | ICD-10-CM

## 2023-09-11 DIAGNOSIS — Z7985 Long-term (current) use of injectable non-insulin antidiabetic drugs: Secondary | ICD-10-CM

## 2023-09-11 DIAGNOSIS — E1165 Type 2 diabetes mellitus with hyperglycemia: Secondary | ICD-10-CM

## 2023-09-11 NOTE — Progress Notes (Signed)
   SUBJECTIVE:  Chief Complaint: Obesity  Interim History: Over the course of the last month and a half patient went to [place]  and hen came home and celebrated her husbands birthday and had many celebratory meals and indulgent snacks. Month of August is going to be much better- she is participating in an exercise program called [employer]; that starts in 5 days.  She is going to [place] to visit her brother but is going for just a weekend.   Jessica Mendoza is here to discuss her progress with her obesity treatment plan. She is on the Category 1 Plan and states she is following her eating plan approximately 25 % of the time. She states she is exercising 20-30 minutes 3 times per week.   OBJECTIVE: Visit Diagnoses: Problem List Items Addressed This Visit       Endocrine   Diabetes mellitus, type 2 (HCC) - Primary     Other   Generalized obesity   BMI 32.0-32.9,adult    Vitals Temp: 98.1 F (36.7 C) BP: 124/77 Pulse Rate: 73 SpO2: 99 %   Anthropometric Measurements Height: 5' 5 (1.651 m) Weight: 198 lb (89.8 kg) BMI (Calculated): 32.95 Weight at Last Visit: 194lb Weight Lost Since Last Visit: 0 Weight Gained Since Last Visit: 4lb Starting Weight: 214lb Total Weight Loss (lbs): 16 lb (7.258 kg)   Body Composition  Body Fat %: 44.5 % Fat Mass (lbs): 88.4 lbs Muscle Mass (lbs): 104.6 lbs Total Body Water (lbs): 80.2 lbs Visceral Fat Rating : 13   Other Clinical Data Fasting: no Labs: no Today's Visit #: 23 Starting Date: 11/08/21 Comments: Cat 1     ASSESSMENT AND PLAN: Assessment & Plan Type 2 diabetes mellitus with hyperglycemia, unspecified whether long term insulin  use (HCC) Diabetes management currently includes metformin  and Mounjaro .  Patient reports good management and control of her intake with both of these medications with minimal if any side effects.  She does not need refills of her medications today but feels as though the current dose is adequate  given her well-controlled A1c as well as the control she has intake of simple carbohydrates.  Will follow-up with labs within the next 3 months. Obesity: Starting BMI 35  BMI 32.0-32.9,adult    Diet: Jessica Mendoza is currently in the action stage of change. As such, her goal is to continue with weight loss efforts and has agreed to the Category 1 Plan.   Exercise:  Older adults should determine their level of effort for physical activity relative to their level of fitness.  Behavior Modification:  We discussed the following Behavioral Modification Strategies today: increasing lean protein intake, decreasing simple carbohydrates, increasing vegetables, meal planning and cooking strategies, keeping healthy foods in the home, and travel eating strategies.   Return in about 4 weeks (around 10/09/2023).   She was informed of the importance of frequent follow up visits to maximize her success with intensive lifestyle modifications for her multiple health conditions.  Attestation Statements:   Reviewed by clinician on day of visit: allergies, medications, problem list, medical history, surgical history, family history, social history, and previous encounter notes.     Adelita Cho, MD

## 2023-09-19 NOTE — Assessment & Plan Note (Signed)
 Diabetes management currently includes metformin  and Mounjaro .  Patient reports good management and control of her intake with both of these medications with minimal if any side effects.  She does not need refills of her medications today but feels as though the current dose is adequate given her well-controlled A1c as well as the control she has intake of simple carbohydrates.  Will follow-up with labs within the next 3 months.

## 2023-09-24 ENCOUNTER — Other Ambulatory Visit (INDEPENDENT_AMBULATORY_CARE_PROVIDER_SITE_OTHER): Payer: Self-pay | Admitting: Physician Assistant

## 2023-09-24 DIAGNOSIS — E559 Vitamin D deficiency, unspecified: Secondary | ICD-10-CM

## 2023-10-09 ENCOUNTER — Ambulatory Visit (INDEPENDENT_AMBULATORY_CARE_PROVIDER_SITE_OTHER): Admitting: Family Medicine

## 2023-10-09 ENCOUNTER — Encounter (INDEPENDENT_AMBULATORY_CARE_PROVIDER_SITE_OTHER): Payer: Self-pay | Admitting: Family Medicine

## 2023-10-09 ENCOUNTER — Other Ambulatory Visit: Payer: Self-pay | Admitting: Physician Assistant

## 2023-10-09 VITALS — BP 129/82 | HR 78 | Temp 98.7°F | Ht 65.0 in | Wt 197.0 lb

## 2023-10-09 DIAGNOSIS — E559 Vitamin D deficiency, unspecified: Secondary | ICD-10-CM | POA: Diagnosis not present

## 2023-10-09 DIAGNOSIS — Z7985 Long-term (current) use of injectable non-insulin antidiabetic drugs: Secondary | ICD-10-CM

## 2023-10-09 DIAGNOSIS — E669 Obesity, unspecified: Secondary | ICD-10-CM

## 2023-10-09 DIAGNOSIS — E1169 Type 2 diabetes mellitus with other specified complication: Secondary | ICD-10-CM | POA: Diagnosis not present

## 2023-10-09 DIAGNOSIS — Z6832 Body mass index (BMI) 32.0-32.9, adult: Secondary | ICD-10-CM

## 2023-10-09 MED ORDER — VITAMIN D (ERGOCALCIFEROL) 1.25 MG (50000 UNIT) PO CAPS
50000.0000 [IU] | ORAL_CAPSULE | ORAL | 0 refills | Status: DC
Start: 2023-10-09 — End: 2024-01-01

## 2023-10-09 MED ORDER — TIRZEPATIDE 7.5 MG/0.5ML ~~LOC~~ SOAJ
7.5000 mg | SUBCUTANEOUS | 0 refills | Status: DC
Start: 2023-10-09 — End: 2024-01-01

## 2023-10-09 NOTE — Progress Notes (Signed)
 SUBJECTIVE:  Chief Complaint: Obesity  Interim History: Patient did well following meal plan some days and other days didn't do as well as she wanted.  Lack of preparation is what leads her to eat off plan.  She cooks a few days a week and makes meals for a few days at a time.  As far as what she eats she is eating on plan.  Lunch is usually meat and a salad.  Dinner is where she may stray off plan due to husband suggesting eating out.  Her husband is working for Labor Day weekend.  She is not doing anything for this weekend. This is the last week of the she moves strong exercise program.  Jessica Mendoza is here to discuss her progress with her obesity treatment plan. She is on the Category 1 Plan and states she is following her eating plan approximately 50 % of the time. She states she is exercising 15-20 minutes 7 times per week.   OBJECTIVE: Visit Diagnoses: Problem List Items Addressed This Visit       Endocrine   Diabetes mellitus, type 2 (HCC) - Primary   Relevant Medications   tirzepatide  (MOUNJARO ) 7.5 MG/0.5ML Pen     Other   Generalized obesity   Relevant Medications   tirzepatide  (MOUNJARO ) 7.5 MG/0.5ML Pen   Vitamin D  deficiency   Relevant Medications   Vitamin D , Ergocalciferol , (DRISDOL ) 1.25 MG (50000 UNIT) CAPS capsule   BMI 32.0-32.9,adult    Vitals Temp: 98.7 F (37.1 C) BP: 129/82 Pulse Rate: 78 SpO2: 99 %   Anthropometric Measurements Height: 5' 5 (1.651 m) Weight: 197 lb (89.4 kg) BMI (Calculated): 32.78 Weight at Last Visit: 198 lb Weight Lost Since Last Visit: 1 Weight Gained Since Last Visit: 0 Starting Weight: 214 lb Total Weight Loss (lbs): 17 lb (7.711 kg)   Body Composition  Body Fat %: 42.9 % Fat Mass (lbs): 84.6 lbs Muscle Mass (lbs): 106.8 lbs Total Body Water (lbs): 78 lbs Visceral Fat Rating : 13   Other Clinical Data Today's Visit #: 24 Starting Date: 11/08/21 Comments: Cat 1     ASSESSMENT AND PLAN: Assessment &  Plan Type 2 diabetes mellitus with other specified complication, without long-term current use of insulin  [facility]) Patient doing well on prescription strength tirzepatide .  She is tolerating 7.5mg  dose and is not experiencing any side effects.  Needs a refill today.  Will continue on current dose as patient is experiencing good control of intake without negative side effects.   Vitamin D  deficiency Most recent assessment of Vitamin D  level slightly decreased from previously.  She needs a refill of prescription strength Vitamin D  today.  No change in Rx.  No nausea, vomiting or muscle weakness mentioned. Obesity: Starting BMI 35  BMI 32.0-32.9,adult    Diet: Jessica Mendoza is currently in the action stage of change. As such, her goal is to continue with weight loss efforts and has agreed to the Category 1 Plan.   Exercise:  Older adults should determine their level of effort for physical activity relative to their level of fitness.  Behavior Modification:  We discussed the following Behavioral Modification Strategies today: increasing lean protein intake, decreasing simple carbohydrates, increasing vegetables, meal planning and cooking strategies, keeping healthy foods in the home, and keep a strict food journal.   Return in about 6 weeks (around 11/20/2023).   She was informed of the importance of frequent follow up visits to maximize her success with intensive lifestyle modifications for her multiple  health conditions.  Attestation Statements:   Reviewed by clinician on day of visit: allergies, medications, problem list, medical history, surgical history, family history, social history, and previous encounter notes.     Adelita Cho, MD

## 2023-10-11 ENCOUNTER — Other Ambulatory Visit: Payer: Self-pay | Admitting: Physician Assistant

## 2023-10-19 NOTE — Assessment & Plan Note (Signed)
 Most recent assessment of Vitamin D  level slightly decreased from previously.  She needs a refill of prescription strength Vitamin D  today.  No change in Rx.  No nausea, vomiting or muscle weakness mentioned.

## 2023-10-19 NOTE — Assessment & Plan Note (Signed)
 Patient doing well on prescription strength tirzepatide .  She is tolerating 7.5mg  dose and is not experiencing any side effects.  Needs a refill today.  Will continue on current dose as patient is experiencing good control of intake without negative side effects.

## 2023-11-04 ENCOUNTER — Telehealth: Payer: Self-pay | Admitting: Physician Assistant

## 2023-11-04 ENCOUNTER — Other Ambulatory Visit: Payer: Self-pay | Admitting: Physician Assistant

## 2023-11-04 MED ORDER — ZYPITAMAG 2 MG PO TABS: 1.0000 | ORAL_TABLET | Freq: Every day | ORAL | 0 refills | Status: DC

## 2023-11-04 NOTE — Telephone Encounter (Signed)
*  STAT* If patient is at the pharmacy, call can be transferred to refill team.   1. Which medications need to be refilled? (please list name of each medication and dose if known)   Pitavastatin  Magnesium (ZYPITAMAG ) 2 MG TABS     2. Would you like to learn more about the convenience, safety, & potential cost savings by using the [facility]?   3. Are you open to using the [facility] (Type [facility].  ).   4. Which pharmacy/location (including street and city if local pharmacy) is medication to be sent to? [facility] - [place], [place] - [address]    5. Do they need a 30 day or 90 day supply? 30 day

## 2023-11-04 NOTE — Telephone Encounter (Signed)
 Pt's medication was sent to pt's pharmacy as requested. Confirmation received.

## 2023-11-12 NOTE — Progress Notes (Unsigned)
  Electrophysiology Office Note:   Date:  11/12/2023  ID:  Jessica Mendoza, Jessica Mendoza [birth year], MRN [id number]  Primary Cardiologist: None Electrophysiologist: Dr. Fernande ->  ***   {Click to update primary MD,subspecialty MD or APP then REFRESH:1}    History of Present Illness:   Jessica Mendoza is a 68 y.o. female with h/o breast cancer (lumpectomy, radioactive seed placement, chemo), HTN, DM, hypothyroidism, OSA (she reports after losing about 30lbs, retested and negative), HLD, AFib, and Family hx of HCM (her echos have been w/o LVH) seen today for routine electrophysiology followup.   Last visit 08/2022  Since last being seen in our clinic the patient reports doing ***.  she denies chest pain, palpitations, dyspnea, PND, orthopnea, nausea, vomiting, dizziness, syncope, edema, weight gain, or early satiety.   Review of systems complete and found to be negative unless listed in HPI.   EP Information / Studies Reviewed:    EKG is ordered today. Personal review as below.       Arrhythmia/Device History No specialty comments available.   Physical Exam:   VS:  There were no vitals taken for this visit.   Wt Readings from Last 3 Encounters:  10/09/23 197 lb (89.4 kg)  09/11/23 198 lb (89.8 kg)  07/31/23 194 lb (88 kg)     GEN: No acute distress NECK: No JVD; No carotid bruits CARDIAC: {EPRHYTHM:28826}, no murmurs, rubs, gallops RESPIRATORY:  Clear to auscultation without rales, wheezing or rhonchi  ABDOMEN: Soft, non-tender, non-distended EXTREMITIES:  {EDEMA LEVEL:28147::No} edema; No deformity   ASSESSMENT AND PLAN:    Persistent AF EKG today shows *** Continue Xarelto  20 mg daily for CHA2DS2VASc of at least 4 No symptoms ***   HTN Stable on current regimen   Family H/o  Of HCM and Aortic rupture Echo 09/2022 with LVEF 60-65%, grade 1 DD, no significant valvular disease CTA Chest 09/2022 with no aortic aneurysm or dilation noted  {Click here to Review  PMH, Prob List, Meds, Allergies, SHx, FHx  :1}   Follow up with {EPMDS:28135::EP Team} {EPFOLLOW UP:28173}  Signed, Ozell Prentice Passey, PA-C

## 2023-11-13 ENCOUNTER — Encounter: Payer: Self-pay | Admitting: Student

## 2023-11-13 ENCOUNTER — Ambulatory Visit: Attending: Student | Admitting: Student

## 2023-11-13 VITALS — BP 120/70 | HR 71 | Ht 65.0 in | Wt 204.0 lb

## 2023-11-13 DIAGNOSIS — I10 Essential (primary) hypertension: Secondary | ICD-10-CM | POA: Diagnosis not present

## 2023-11-13 DIAGNOSIS — Z8249 Family history of ischemic heart disease and other diseases of the circulatory system: Secondary | ICD-10-CM | POA: Diagnosis not present

## 2023-11-13 DIAGNOSIS — I480 Paroxysmal atrial fibrillation: Secondary | ICD-10-CM | POA: Diagnosis not present

## 2023-11-13 MED ORDER — AMLODIPINE BESYLATE 5 MG PO TABS: 5.0000 mg | ORAL_TABLET | Freq: Every day | ORAL | 3 refills | Status: DC

## 2023-11-13 NOTE — Patient Instructions (Signed)
 Medication Instructions:  Your physician recommends that you continue on your current medications as directed. Please refer to the Current Medication list given to you today.  *If you need a refill on your cardiac medications before your next appointment, please call your pharmacy*  Lab Work: None ordered If you have labs (blood work) drawn today and your tests are completely normal, you will receive your results only by: MyChart Message (if you have MyChart) OR A paper copy in the mail If you have any lab test that is abnormal or we need to change your treatment, we will call you to review the results.  Follow-Up: At [facility], you and your health needs are our priority.  As part of our continuing mission to provide you with exceptional heart care, our providers are all part of one team.  This team includes your primary Cardiologist (physician) and Advanced Practice Providers or APPs (Physician Assistants and Nurse Practitioners) who all work together to provide you with the care you need, when you need it.  Your next appointment:   1 year(s)  Provider:   Donnice Primus, MD

## 2023-11-27 ENCOUNTER — Ambulatory Visit (INDEPENDENT_AMBULATORY_CARE_PROVIDER_SITE_OTHER): Admitting: Physician Assistant

## 2023-11-27 ENCOUNTER — Encounter (INDEPENDENT_AMBULATORY_CARE_PROVIDER_SITE_OTHER): Payer: Self-pay | Admitting: Physician Assistant

## 2023-11-27 VITALS — BP 112/73 | HR 78 | Temp 98.0°F | Ht 65.0 in | Wt 203.0 lb

## 2023-11-27 DIAGNOSIS — E1169 Type 2 diabetes mellitus with other specified complication: Secondary | ICD-10-CM | POA: Diagnosis not present

## 2023-11-27 DIAGNOSIS — E1165 Type 2 diabetes mellitus with hyperglycemia: Secondary | ICD-10-CM

## 2023-11-27 DIAGNOSIS — E1159 Type 2 diabetes mellitus with other circulatory complications: Secondary | ICD-10-CM | POA: Diagnosis not present

## 2023-11-27 DIAGNOSIS — I152 Hypertension secondary to endocrine disorders: Secondary | ICD-10-CM

## 2023-11-27 DIAGNOSIS — E785 Hyperlipidemia, unspecified: Secondary | ICD-10-CM | POA: Diagnosis not present

## 2023-11-27 DIAGNOSIS — E669 Obesity, unspecified: Secondary | ICD-10-CM

## 2023-11-27 DIAGNOSIS — Z7984 Long term (current) use of oral hypoglycemic drugs: Secondary | ICD-10-CM

## 2023-11-27 DIAGNOSIS — E559 Vitamin D deficiency, unspecified: Secondary | ICD-10-CM

## 2023-11-27 DIAGNOSIS — Z6833 Body mass index (BMI) 33.0-33.9, adult: Secondary | ICD-10-CM

## 2023-11-27 DIAGNOSIS — Z7985 Long-term (current) use of injectable non-insulin antidiabetic drugs: Secondary | ICD-10-CM

## 2023-11-27 NOTE — Progress Notes (Unsigned)
 SUBJECTIVE: Discussed the use of AI scribe software for clinical note transcription with the patient, who gave verbal consent to proceed.  Chief Complaint: Obesity  Interim History: She is up 6 lbs since her last visit.   Jessica Mendoza is here to discuss her progress with her obesity treatment plan. She is on the Category 1 Plan and states she is following her eating plan approximately 60 % of the time. She states she is exercising working with personal trainer resistance training/cardio 30 minutes 2 times per week.  Jessica Mendoza is a 68 year old female who presents for follow-up on her obesity treatment plan.  She has been actively working with a Psychologist, educational at [employer] since September 17th and has noticed some weight gain. Despite the weight gain, she remains committed to her exercise regimen, which includes balance and strength training exercises.  Her nutrition plan includes more salmon, [place], and green beans, and she uses plant-based protein drinks post-training. She celebrated her 68th birthday recently and indulged in cake but returned to her healthy eating plan afterward. She struggles with eating three meals a day, typically having a late breakfast and early dinner, and journals her food intake without using an app. She avoids sugar-sweetened beverages and prefers natural popcorn as a snack.  She is planning a trip to the beach and has strategies to maintain her diet, including cooking meals in a kitchen-equipped accommodation and using protein pasta with ground lamb.  She is currently on 7.5 mg of Mounjaro  and reports no side effects such as nausea, vomiting, constipation, or diarrhea. She occasionally drinks herbal teas that have a laxative effect. She takes vitamin D  every two weeks and has three doses left. Her last A1c was 5.7. She was taking metformin  on Monday, Wednesday, and Friday, but her insurance denied the extended-release form, and she is awaiting a new  prescription. OBJECTIVE: Visit Diagnoses: Problem List Items Addressed This Visit     Diabetes mellitus, type 2 (HCC) - Primary   Hyperlipidemia associated with type 2 diabetes mellitus (HCC)   Generalized obesity   Vitamin D  deficiency   Hypertension associated with type 2 diabetes mellitus (HCC)   Other Visit Diagnoses       BMI 33.0-33.9,adult Current BMI 33.8         Obesity Ongoing management with recent weight gain likely due to increased muscle mass and water weight. - Continue working with a trainer focusing on muscle strengthening, balance, and cardio. - Maintain dietary plan with increased protein and healthy fats. - Journal food intake and bring log to next appointment. - Implement discussed strategies for managing diet during beach trip.  Type 2 diabetes mellitus Well-managed with an A1c of 5.7. She is on Mounjaro  7.5 mg, tolerating it well without significant side effects. Metformin  is not currently taken due to insurance issues with the extended-release formulation. - Continue Mounjaro  7.5 mg as prescribed. - Resolve metformin  prescription issue and resume taking it Monday, Wednesday, Friday. - Monitor glucose levels and dietary intake. Hypertension Hypertension asymptomatic, reasonably well controlled, and no significant medication side effects noted.  Medication(s): amlodipine  5 mg daily  BP Readings from Last 3 Encounters:  11/27/23 112/73  11/13/23 120/70  10/09/23 129/82   Lab Results  Component Value Date   CREATININE 0.93 07/31/2023   CREATININE 0.75 03/13/2023   CREATININE 0.90 08/29/2022   Lab Results  Component Value Date   GFR 76.87 07/08/2012    Plan: Continue all antihypertensives at current dosages. Continue to  work on Engineer, technical sales to promote weight loss and improve BP control.   Hyperlipidemia LDL is not at goal. Medication(s): Pitavastatin , CoQ10 and Mounjaro  No reported SE Cardiovascular risk factors: advanced age (older than 23  for men, 8 for women), diabetes mellitus, dyslipidemia, hypertension, obesity (BMI >= 30 kg/m2), and sedentary lifestyle  Lab Results  Component Value Date   CHOL 150 07/31/2023   HDL 61 07/31/2023   LDLCALC 73 07/31/2023   TRIG 86 07/31/2023   CHOLHDL 2.8 12/13/2021   CHOLHDL 2.5 05/31/2021   CHOLHDL 2.9 04/07/2013   Lab Results  Component Value Date   ALT 20 07/31/2023   AST 19 07/31/2023   ALKPHOS 105 07/31/2023   BILITOT 0.5 07/31/2023   The 10-year ASCVD risk score (Arnett DK, et al., 2019) is: 15.5%   Values used to calculate the score:     Age: 51 years     Clincally relevant sex: Female     Is Non-Hispanic African American: Yes     Diabetic: Yes     Tobacco smoker: No     Systolic Blood Pressure: 112 mmHg     Is BP treated: Yes     HDL Cholesterol: 61 mg/dL     Total Cholesterol: 150 mg/dL  Plan: Continue statin. Continue Mounjaro  Continue to work on nutrition plan -decreasing simple carbohydrates, increasing lean proteins, decreasing saturated fats and cholesterol , avoiding trans fats and exercise as able to promote weight loss, improve lipids and decrease cardiovascular risks.  Vitamin D  deficiency Managed with supplementation. Levels are within the goal range of 50 to 70.Ergocalciferol  50,000 units every other week.  Last vitamin D  Lab Results  Component Value Date   VD25OH 48.8 07/31/2023   Low vitamin D  levels can be associated with adiposity and may result in leptin resistance and weight gain. Also associated with fatigue.  Currently on vitamin D  supplementation without any adverse effects such as nausea, vomiting or muscle weakness.   - Continue vitamin D  supplementation Ergocalciferol  50,000 units every two weeks.  Vitals Temp: 98 F (36.7 C) BP: 112/73 Pulse Rate: 78 SpO2: 96 %   Anthropometric Measurements Height: 5' 5 (1.651 m) Weight: 203 lb (92.1 kg) BMI (Calculated): 33.78 Weight at Last Visit: 197 lb Weight Lost Since Last Visit:  0 Weight Gained Since Last Visit: 6 lb Starting Weight: 214 lb Total Weight Loss (lbs): 11 lb (4.99 kg)   Body Composition  Body Fat %: 46.1 % Fat Mass (lbs): 93.6 lbs Muscle Mass (lbs): 104 lbs Total Body Water (lbs): 82.6 lbs Visceral Fat Rating : 14   Other Clinical Data Fasting: No Labs: No Today's Visit #: 25 Starting Date: 11/08/21     ASSESSMENT AND PLAN:  Diet: Jessica Mendoza is currently in the action stage of change. As such, her goal is to continue with weight loss efforts. She has agreed to Category 1 Plan.  Exercise: Jessica Mendoza has been instructed to work up to a goal of 150 minutes of combined cardio and strengthening exercise per week and to continue exercising as is for weight loss and overall health benefits.   Behavior Modification:  We discussed the following Behavioral Modification Strategies today: increasing lean protein intake, decreasing simple carbohydrates, increasing vegetables, increase H2O intake, increase high fiber foods, meal planning and cooking strategies, better snacking choices, travel eating strategies, avoiding temptations, and planning for success. We discussed various medication options to help Jessica Mendoza with her weight loss efforts and we both agreed to continue current treatment plan.  Return in about 5 weeks (around 01/01/2024).SABRA She was informed of the importance of frequent follow up visits to maximize her success with intensive lifestyle modifications for her multiple health conditions.  Attestation Statements:   Reviewed by clinician on day of visit: allergies, medications, problem list, medical history, surgical history, family history, social history, and previous encounter notes.   Time spent on visit including pre-visit chart review and post-visit care and charting was 28 minutes.    Jahziel Sinn, PA-C

## 2023-12-09 ENCOUNTER — Telehealth: Payer: Self-pay | Admitting: Student

## 2023-12-09 MED ORDER — AMLODIPINE BESYLATE 5 MG PO TABS: 5.0000 mg | ORAL_TABLET | Freq: Every day | ORAL | 3 refills | Status: AC

## 2023-12-09 MED ORDER — ZYPITAMAG 2 MG PO TABS: 1.0000 | ORAL_TABLET | Freq: Every day | ORAL | 0 refills | Status: DC

## 2023-12-09 NOTE — Telephone Encounter (Signed)
*  STAT* If patient is at the pharmacy, call can be transferred to refill team.   1. Which medications need to be refilled? (please list name of each medication and dose if known)   amLODipine  (NORVASC ) 5 MG tablet   DIFFERENT PHARMACY   4. Which pharmacy/location (including street and city if local pharmacy) is medication to be sent to?  [facility] - [place], [place] - [address] Phone: [phone]  Fax: [phone]       5. Do they need a 30 day or 90 day supply? 90

## 2023-12-09 NOTE — Telephone Encounter (Signed)
 Sent in the Amlodipine  to pt's pharmacy.

## 2023-12-16 ENCOUNTER — Encounter: Payer: Self-pay | Admitting: Hematology and Oncology

## 2023-12-18 ENCOUNTER — Encounter: Payer: Self-pay | Admitting: Hematology and Oncology

## 2023-12-23 ENCOUNTER — Ambulatory Visit (INDEPENDENT_AMBULATORY_CARE_PROVIDER_SITE_OTHER): Admitting: Nurse Practitioner

## 2023-12-23 ENCOUNTER — Encounter (HOSPITAL_BASED_OUTPATIENT_CLINIC_OR_DEPARTMENT_OTHER): Payer: Self-pay | Admitting: Nurse Practitioner

## 2023-12-23 ENCOUNTER — Other Ambulatory Visit (HOSPITAL_BASED_OUTPATIENT_CLINIC_OR_DEPARTMENT_OTHER): Payer: Self-pay

## 2023-12-23 VITALS — BP 134/64 | HR 77 | Ht 65.0 in | Wt 210.0 lb

## 2023-12-23 DIAGNOSIS — Z8249 Family history of ischemic heart disease and other diseases of the circulatory system: Secondary | ICD-10-CM | POA: Diagnosis not present

## 2023-12-23 DIAGNOSIS — I10 Essential (primary) hypertension: Secondary | ICD-10-CM

## 2023-12-23 DIAGNOSIS — E785 Hyperlipidemia, unspecified: Secondary | ICD-10-CM | POA: Diagnosis not present

## 2023-12-23 DIAGNOSIS — I480 Paroxysmal atrial fibrillation: Secondary | ICD-10-CM

## 2023-12-23 DIAGNOSIS — Z7189 Other specified counseling: Secondary | ICD-10-CM

## 2023-12-23 DIAGNOSIS — E118 Type 2 diabetes mellitus with unspecified complications: Secondary | ICD-10-CM

## 2023-12-23 DIAGNOSIS — Z7901 Long term (current) use of anticoagulants: Secondary | ICD-10-CM

## 2023-12-23 MED ORDER — ZYPITAMAG 2 MG PO TABS: 1.0000 | ORAL_TABLET | Freq: Every day | ORAL | 11 refills | Status: AC

## 2023-12-23 NOTE — Progress Notes (Signed)
 " Cardiology Office Note   Date:  12/24/2023  ID:  Jozey, Janco Mar 12, 1955, MRN [id number] PCP: Sigrid Kays, [place], MD  [facility] Providers Cardiologist:  None     PMH Hypertension Hypothyroidism Type 2 diabetes Hyperlipidemia Persistent atrial fibrillation on chronic anticoagulation Family history HCM and aortic rupture Breast cancer Statin intolerance Aortic atherosclerosis  Referred to Advanced lipid disorders clinic and seen by Dr. Mona 02/17/2021.  No known history of coronary disease.  Unfortunately she has been statin intolerant with LDL at time of referral 164 on red yeast rice.  She was able to tolerate Livalo but her insurance stopped paying for it.  She was started on Zypitamag  2 mg daily with improvement in LDL to 73.  Echo 10/03/22 with normal LVEF 60-65%, no rwma, G1DD, normal RV, no significant valve disease. Followed by EP team for management of atrial fibrillation.   History of Present Illness  Discussed the use of AI scribe software for clinical note transcription with the patient, who gave verbal consent to proceed.  History of Present Illness Jessica Mendoza is a very pleasant 68 year old female who presents today for follow-up of dyslipidemia. She needs a refill on Zypitamag  which she is tolerating without concerning side effects. Overall, is feeling well. She exercises regularly with a trainer twice a week, focusing on balance and strength, and incorporates short exercise sessions at home throughout the day. Her diet includes meal prepping to maintain healthy eating habits, though she occasionally indulges in sweets, particularly pecan pie. We discussed prior cardiac testing for hypertrophic cardiomyopathy and aortic aneurysm, which are part of her family history. Her brother had an aortic aneurysm two years ago. Discussed finding that prior CT revealed aortic atherosclerosis.  She had recent mammogram and was told there was no  breast artery calcification.  She denies chest pain, shortness of breath, orthopnea, PND, edema, presyncope, syncope, palpitations.   ROS: See HPI  Studies Reviewed     Echo 10/03/22 1. Left ventricular ejection fraction, by estimation, is 60 to 65%. The  left ventricle has normal function. The left ventricle has no regional  wall motion abnormalities. Left ventricular diastolic parameters are  consistent with Grade I diastolic  dysfunction (impaired relaxation).   2. Right ventricular systolic function is normal. The right ventricular  size is normal. There is normal pulmonary artery systolic pressure.   3. The mitral valve is normal in structure. Trivial mitral valve  regurgitation. No evidence of mitral stenosis.   4. The aortic valve is tricuspid. Aortic valve regurgitation is not  visualized. No aortic stenosis is present.   5. The inferior vena cava is normal in size with greater than 50%  respiratory variability, suggesting right atrial pressure of 3 mmHg  CTA Chest Aorta 09/19/22 IMPRESSION: 1. No acute intrathoracic pathology. No aortic aneurysm or dissection. 2.  Aortic Atherosclerosis (ICD10-I70.0).  No results found for: LIPOA  Risk Assessment/Calculations  CHA2DS2-VASc Score = 4   This indicates a 4.8% annual risk of stroke. The patient's score is based upon: CHF History: 0 HTN History: 1 Diabetes History: 1 Stroke History: 0 Vascular Disease History: 0 Age Score: 1 Gender Score: 1            Physical Exam VS:  BP 134/64 (BP Location: Left Arm, Patient Position: Sitting, Cuff Size: Large)   Pulse 77   Ht 5' 5 (1.651 m)   Wt 210 lb (95.3 kg)   SpO2 94%   BMI 34.95 kg/m  Wt Readings from Last 3 Encounters:  12/23/23 210 lb (95.3 kg)  11/27/23 203 lb (92.1 kg)  11/13/23 204 lb (92.5 kg)    GEN: Well nourished, well developed in no acute distress NECK: No JVD; No carotid bruits CARDIAC: RRR, no murmurs, rubs, gallops RESPIRATORY:  Clear to  auscultation without rales, wheezing or rhonchi  ABDOMEN: Soft, non-tender, non-distended EXTREMITIES:  No edema; No deformity    Assessment & Plan Hyperlipidemia LDL goal < 70 Diabetes Lipid panel completed 07/31/2022 with total cholesterol 150, triglycerides 86, HDL 61, LDL-C 73.  Advised goal LDL is 70 or lower given history of diabetes and aortic atherosclerosis.  She is intolerant of statins.  She is tolerating pitavastatin  without concerning side effects and price is reasonable for her. A1C 5.7 on 07/31/23. She admits she eats better when she meal preps and does indulge in desserts regularly, but limits portion sizes. Is working out twice weekly with a trainer and doing 10 minute intervals of exercise at home throughout the day.  She is due to see PCP in a few months and fasting lipids will be ordered at that time.  - Continue Zypitamag  - Heart healthy diet avoiding processed food, saturated fat, sugar, and other simple carbohydrates -Continue regular physical activity aiming for at least 150 minutes of moderate intensity exercise each week  Aortic atherosclerosis Cardiac risk Family history of aortic aneurysm. CTA 09/2022 with no evidence of aortic aneurysm.  We discussed aortic atherosclerosis noted on CT and her recent evaluation for breast calcification.  Coronary atherosclerosis has not been noted on prior imaging.  She would like to proceed with getting CT calcium  score. She denies chest pain, dyspnea, or other symptoms concerning for angina.  No indication for further ischemic evaluation at this time.  - CT calcium  score for further risk stratification - Continue pitavastatin , metoprolol , amlodipine   Essential hypertension   BP is well-controlled.  She asks about amlodipine  prescription which was refilled in October by Jodie Passey, PA.  No change in antihypertensive therapy today.   - Continue amlodipine    Atrial fibrillation on chronic anticoagulation Not specifically addressed  today. Clinically appears to be in sinus rhythm. No acute concerns.  - Continue Xarelto  20 mg daily which is appropriate dose for CHA2DS2-VASc score of 4 - Continue metoprolol  for rate control         Dispo: 1 year with Lipid clinic  Signed, Rosaline Bane, NP-C "

## 2023-12-23 NOTE — Patient Instructions (Signed)
 Medication Instructions:   Your physician recommends that you continue on your current medications as directed. Please refer to the Current Medication list given to you today.   *If you need a refill on your cardiac medications before your next appointment, please call your pharmacy*  Lab Work:  None ordered.  If you have labs (blood work) drawn today and your tests are completely normal, you will receive your results only by: MyChart Message (if you have MyChart) OR A paper copy in the mail If you have any lab test that is abnormal or we need to change your treatment, we will call you to review the results.  Testing/Procedures:    Rosaline Bane, NP has ordered a CT coronary calcium  score.   Test locations:   [facility] [place]    This is $99 out of pocket.   Coronary CalciumScan A coronary calcium  scan is an imaging test used to look for deposits of calcium  and other fatty materials (plaques) in the inner lining of the blood vessels of the heart (coronary arteries). These deposits of calcium  and plaques can partly clog and narrow the coronary arteries without producing any symptoms or warning signs. This puts a person at risk for a heart attack. This test can detect these deposits before symptoms develop. Tell a health care provider about: Any allergies you have. All medicines you are taking, including vitamins, herbs, eye drops, creams, and over-the-counter medicines. Any problems you or family members have had with anesthetic medicines. Any blood disorders you have. Any surgeries you have had. Any medical conditions you have. Whether you are pregnant or may be pregnant. What are the risks? Generally, this is a safe procedure. However, problems may occur, including: Harm to a pregnant woman and her unborn baby. This test involves the use of radiation. Radiation exposure can be dangerous to a pregnant woman and her unborn baby. If you are pregnant, you  generally should not have this procedure done. Slight increase in the risk of cancer. This is because of the radiation involved in the test. What happens before the procedure? No preparation is needed for this procedure. What happens during the procedure? You will undress and remove any jewelry around your neck or chest. You will put on a hospital gown. Sticky electrodes will be placed on your chest. The electrodes will be connected to an electrocardiogram (ECG) machine to record a tracing of the electrical activity of your heart. A CT scanner will take pictures of your heart. During this time, you will be asked to lie still and hold your breath for 2-3 seconds while a picture of your heart is being taken. The procedure may vary among health care providers and hospitals. What happens after the procedure? You can get dressed. You can return to your normal activities. It is up to you to get the results of your test. Ask your health care provider, or the department that is doing the test, when your results will be ready. Summary A coronary calcium  scan is an imaging test used to look for deposits of calcium  and other fatty materials (plaques) in the inner lining of the blood vessels of the heart (coronary arteries). Generally, this is a safe procedure. Tell your health care provider if you are pregnant or may be pregnant. No preparation is needed for this procedure. A CT scanner will take pictures of your heart. You can return to your normal activities after the scan is done. This information is not intended to replace advice given  to you by your health care provider. Make sure you discuss any questions you have with your health care provider. Document Released: 07/28/2007 Document Revised: 12/19/2015 Document Reviewed: 12/19/2015 Elsevier Interactive Patient Education  2017 [employer].   Follow-Up: At [facility], you and your health needs are our priority.  As part of our  continuing mission to provide you with exceptional heart care, our providers are all part of one team.  This team includes your primary Cardiologist (physician) and Advanced Practice Providers or APPs (Physician Assistants and Nurse Practitioners) who all work together to provide you with the care you need, when you need it.  Your next appointment:   1 year(s)  Provider:   Rosaline Bane, NP    We recommend signing up for the patient portal called MyChart.  Sign up information is provided on this After Visit Summary.  MyChart is used to connect with patients for Virtual Visits (Telemedicine).  Patients are able to view lab/test results, encounter notes, upcoming appointments, etc.  Non-urgent messages can be sent to your provider as well.   To learn more about what you can do with MyChart, go to [email or web address].   Other Instructions  Your physician wants you to follow-up in: 1 year.  You will receive a reminder letter in the mail two months in advance. If you don't receive a letter, please call our office to schedule the follow-up appointment.

## 2023-12-24 ENCOUNTER — Encounter (HOSPITAL_BASED_OUTPATIENT_CLINIC_OR_DEPARTMENT_OTHER): Payer: Self-pay | Admitting: Nurse Practitioner

## 2023-12-25 ENCOUNTER — Other Ambulatory Visit (INDEPENDENT_AMBULATORY_CARE_PROVIDER_SITE_OTHER): Payer: Self-pay | Admitting: Physician Assistant

## 2023-12-25 ENCOUNTER — Other Ambulatory Visit (INDEPENDENT_AMBULATORY_CARE_PROVIDER_SITE_OTHER): Payer: Self-pay | Admitting: Family Medicine

## 2023-12-25 DIAGNOSIS — E559 Vitamin D deficiency, unspecified: Secondary | ICD-10-CM

## 2024-01-01 ENCOUNTER — Ambulatory Visit (INDEPENDENT_AMBULATORY_CARE_PROVIDER_SITE_OTHER): Payer: Self-pay | Admitting: Physician Assistant

## 2024-01-01 ENCOUNTER — Encounter (INDEPENDENT_AMBULATORY_CARE_PROVIDER_SITE_OTHER): Payer: Self-pay | Admitting: Physician Assistant

## 2024-01-01 ENCOUNTER — Other Ambulatory Visit: Payer: Self-pay | Admitting: Hematology and Oncology

## 2024-01-01 ENCOUNTER — Ambulatory Visit (HOSPITAL_BASED_OUTPATIENT_CLINIC_OR_DEPARTMENT_OTHER)
Admission: RE | Admit: 2024-01-01 | Discharge: 2024-01-01 | Disposition: A | Discharge status: Home / Self Care | Payer: Self-pay | Source: Ambulatory Visit | Attending: Nurse Practitioner | Admitting: Nurse Practitioner

## 2024-01-01 VITALS — BP 138/77 | HR 76 | Temp 97.7°F | Ht 65.0 in | Wt 202.0 lb

## 2024-01-01 DIAGNOSIS — E1169 Type 2 diabetes mellitus with other specified complication: Secondary | ICD-10-CM | POA: Diagnosis not present

## 2024-01-01 DIAGNOSIS — E1159 Type 2 diabetes mellitus with other circulatory complications: Secondary | ICD-10-CM | POA: Diagnosis not present

## 2024-01-01 DIAGNOSIS — I152 Hypertension secondary to endocrine disorders: Secondary | ICD-10-CM

## 2024-01-01 DIAGNOSIS — I480 Paroxysmal atrial fibrillation: Secondary | ICD-10-CM

## 2024-01-01 DIAGNOSIS — E039 Hypothyroidism, unspecified: Secondary | ICD-10-CM

## 2024-01-01 DIAGNOSIS — E785 Hyperlipidemia, unspecified: Secondary | ICD-10-CM | POA: Insufficient documentation

## 2024-01-01 DIAGNOSIS — Z6833 Body mass index (BMI) 33.0-33.9, adult: Secondary | ICD-10-CM

## 2024-01-01 DIAGNOSIS — E1165 Type 2 diabetes mellitus with hyperglycemia: Secondary | ICD-10-CM | POA: Diagnosis not present

## 2024-01-01 DIAGNOSIS — Z7985 Long-term (current) use of injectable non-insulin antidiabetic drugs: Secondary | ICD-10-CM

## 2024-01-01 DIAGNOSIS — I10 Essential (primary) hypertension: Secondary | ICD-10-CM | POA: Insufficient documentation

## 2024-01-01 DIAGNOSIS — Z7984 Long term (current) use of oral hypoglycemic drugs: Secondary | ICD-10-CM

## 2024-01-01 DIAGNOSIS — R5383 Other fatigue: Secondary | ICD-10-CM

## 2024-01-01 DIAGNOSIS — Z8249 Family history of ischemic heart disease and other diseases of the circulatory system: Secondary | ICD-10-CM | POA: Insufficient documentation

## 2024-01-01 DIAGNOSIS — E669 Obesity, unspecified: Secondary | ICD-10-CM

## 2024-01-01 DIAGNOSIS — E559 Vitamin D deficiency, unspecified: Secondary | ICD-10-CM

## 2024-01-01 MED ORDER — TIRZEPATIDE 7.5 MG/0.5ML ~~LOC~~ SOAJ: 7.5000 mg | SUBCUTANEOUS | 0 refills | Status: DC

## 2024-01-01 MED ORDER — VITAMIN D (ERGOCALCIFEROL) 1.25 MG (50000 UNIT) PO CAPS: 50000.0000 [IU] | ORAL_CAPSULE | ORAL | 0 refills | Status: DC

## 2024-01-01 NOTE — Progress Notes (Signed)
 SUBJECTIVE: Discussed the use of AI scribe software for clinical note transcription with the patient, who gave verbal consent to proceed.  Chief Complaint: Obesity  Interim History: She is down 1 lbs since her last visit. Down 12 lbs overall TBW Loss of 5.61%  Jessica Mendoza is here to discuss her progress with her obesity treatment plan. She is on the Category 1 Plan and states she is following her eating plan approximately 70 % of the time. She states she is exercising Cardio/balance/strength training 30 minutes 3 times per week.  Jessica Mendoza is a 68 year old female who presents for follow-up of her obesity treatment plan.  She is actively engaged in managing her obesity, noting a decrease in weight, reduction in adipose tissue, and an increase in muscle mass. She participates in strength training and gym activities. She is concerned about not meeting her protein intake goals and is considering using apps like MyFitnessPal to track/journal her nutrition. She consumes protein drinks and is mindful of portion sizes, especially with upcoming holiday events.  She has type 2 diabetes and is on Mounjaro  7.5 mg weekly, which helps control her appetite and cravings. Her last A1c was 5.7. She takes metformin  immediate release once daily due to insurance issues with the extended release form.  Her hypertension is managed with amlodipine  5 mg daily and metoprolol  50 mg daily. She also takes Xarelto  20 mg daily for paroxysmal atrial fibrillation.  For hyperlipidemia, she is on pitavastatin  2 mg daily and supplements with CoQ10. She takes ergocalciferol  50,000 units every 14 days for vitamin D  deficiency.  She is on levothyroxine 25 mcg daily for hypothyroidism. She frequently feels tired and is working on improving her sleep schedule, now going to bed around 11 PM.  She experiences tingling and numbness in her right hand, particularly in three fingers, and reports extensive mouse use at work.  She plans to see her PCP or specialist concerning these symptoms.   Fasting labs obtained today.  The patient was informed we would discuss the lab results at the next visit unless there is a critical issue that needs to be addressed sooner. The patient agreed to keep the next visit at the agreed upon time to discuss these results.   OBJECTIVE: Visit Diagnoses: Problem List Items Addressed This Visit     Diabetes mellitus, type 2 (HCC) - Primary   Relevant Medications   tirzepatide  (MOUNJARO ) 7.5 MG/0.5ML Pen   Other Relevant Orders   CMP14+EGFR   Hemoglobin A1c   Insulin , random   Hyperlipidemia associated with type 2 diabetes mellitus (HCC)   Relevant Medications   tirzepatide  (MOUNJARO ) 7.5 MG/0.5ML Pen   Other Relevant Orders   Lipid Panel With LDL/HDL Ratio   Hypothyroidism   Generalized obesity   Relevant Medications   tirzepatide  (MOUNJARO ) 7.5 MG/0.5ML Pen   Vitamin D  deficiency   Relevant Medications   Vitamin D , Ergocalciferol , (DRISDOL ) 1.25 MG (50000 UNIT) CAPS capsule   Other Relevant Orders   VITAMIN D  25 Hydroxy (Vit-D Deficiency, Fractures)   Other fatigue   Relevant Orders   Vitamin B12   TSH   Hypertension associated with type 2 diabetes mellitus (HCC)   Relevant Medications   tirzepatide  (MOUNJARO ) 7.5 MG/0.5ML Pen   Other Visit Diagnoses       BMI 33.0-33.9,adult Current BMI 33.6          Obesity Management is ongoing with positive progress. She has increased muscle mass from 104 to 107.4 pounds and decreased  adipose tissue from 93.6 to 89 pounds. Visceral adipose rating has decreased to 13, and overall body fat percentage is down to 44%. The goal is to reduce body fat percentage to 35% or less. She is not meeting the recommended protein intake, primarily due to difficulty in consuming enough meat. She is using protein drinks and considering using a food tracking app to monitor intake. - Encouraged use of MyFitnessPal or Lose It app to track protein  intake and calories. - Advised maintaining a daily caloric intake of 1000-1100 calories. - Recommended a protein goal of 75 grams or more per day. - Suggested increasing fiber intake to at least 25 grams per day. - Encouraged consumption of 3-5 servings of vegetables and fruits daily. - Advised on portion control during upcoming Thanksgiving and birthday events.  Type 2 diabetes mellitus Type 2 diabetes is managed with Mounjaro  7.5 mg weekly and metformin  500 mg BID . She reports good appetite control and minimal cravings. Last A1c was 5.7, indicating good glycemic control. She is also on immediate release metformin  once daily, although prescribed twice daily and she is monitoring for any GI issues with metformin .  No SE with Mounjaro - Denies mass in neck, dysphagia, dyspepsia, persistent hoarseness, abdominal pain, or N/V/Constipation or diarrhea. Has annual eye exam. Mood is stable.  Plan;  She is working  on nutrition plan to decrease simple carbohydrates, increase lean proteins and exercise to promote weight loss and improve glycemic control . - Continue/refill Mounjaro  7.5 mg weekly. - Continue immediate release metformin  once daily. - Ordered A1c, liver and kidney function tests, insulin  level, and lipid panel. - Refilled Mounjaro  with a three-month supply.  Hypertension Managed with amlodipine  5 mg daily. No SE/asymptomatic BP Readings from Last 3 Encounters:  01/01/24 138/77  12/23/23 134/64  11/27/23 112/73   Lab Results  Component Value Date   NA 143 07/31/2023   CL 102 07/31/2023   K 4.0 07/31/2023   CO2 22 07/31/2023   BUN 13 07/31/2023   CREATININE 0.93 07/31/2023   EGFR 67 07/31/2023   CALCIUM  9.4 07/31/2023   ALBUMIN 4.6 07/31/2023   GLUCOSE 75 07/31/2023   Continue to work on nutrition plan to promote weight loss and improve BP control.  Monitor for hypotension on GLP/GIP medication. None noted.  - Continue amlodipine  5 mg daily per  PCP  Hyperlipidemia Managed with pitavastatin  2 mg daily and CoQ10 supplements. LDL above goal for T2DM, HDL at goal. Trig at goal.  Last lipids Lab Results  Component Value Date   CHOL 150 07/31/2023   HDL 61 07/31/2023   LDLCALC 73 07/31/2023   TRIG 86 07/31/2023   CHOLHDL 2.8 12/13/2021   The 10-year ASCVD risk score (Arnett DK, et al., 2019) is: 22.8%   Values used to calculate the score:     Age: 14 years     Clincally relevant sex: Female     Is Non-Hispanic African American: Yes     Diabetic: Yes     Tobacco smoker: No     Systolic Blood Pressure: 138 mmHg     Is BP treated: Yes     HDL Cholesterol: 61 mg/dL     Total Cholesterol: 150 mg/dL -continue Mounjaro  which should also decrease CV risks.  - Continue pitavastatin  2 mg daily. - Continue CoQ10 supplements. Continue to work on nutrition plan -decreasing simple carbohydrates, increasing lean proteins, decreasing saturated fats and cholesterol , avoiding trans fats and exercise as able to promote weight loss, improve  lipids and decrease cardiovascular risks.  Vitamin D  deficiency Managed with ergocalciferol  50,000 units every 14 days. No side effects reported. Last vitamin D  Lab Results  Component Value Date   VD25OH 48.8 07/31/2023  Low vitamin D  levels can be associated with adiposity and may result in leptin resistance and weight gain. Also associated with fatigue.  Currently on vitamin D  supplementation without any adverse effects such as nausea, vomiting or muscle weakness.  Recheck vitamin D  level today - Continue ergocalciferol  50,000 units every 14 days. - Refilled vitamin D  prescription.  Hypothyroidism Managed with levothyroxine 25 mcg daily. She reports feeling tired, which may be related to thyroid  function. - Continue levothyroxine 25 mcg daily. - Ordered thyroid  function test.  Paroxysmal atrial fibrillation Managed with metoprolol  50 mg daily and Xarelto  20 mg daily. - Continue metoprolol  50 mg  daily. - Continue Xarelto  20 mg daily.  Fatigue Endorses some fatigue.  Recheck vitamin B 12 along with TSH, and vitamin D  levels today.   General Health Maintenance She is working on improving sleep hygiene by going to bed earlier. She is also focusing on hydration and portion control during upcoming events. - Encouraged continued efforts to improve sleep hygiene. - Advised on maintaining hydration with a goal of 64 ounces of water daily. - Encouraged portion control during upcoming events. Vitals Temp: 97.7 F (36.5 C) BP: 138/77 Pulse Rate: 76 SpO2: 98 %   Anthropometric Measurements Height: 5' 5 (1.651 m) Weight: 202 lb (91.6 kg) BMI (Calculated): 33.61 Weight at Last Visit: 203 lb Weight Lost Since Last Visit: 1 lb Weight Gained Since Last Visit: 0 Starting Weight: 214 lb Total Weight Loss (lbs): 12 lb (5.443 kg)   Body Composition  Body Fat %: 44 % Fat Mass (lbs): 89 lbs Muscle Mass (lbs): 107.4 lbs Total Body Water (lbs): 80.2 lbs Visceral Fat Rating : 13   Other Clinical Data Fasting: Yes Labs: Yes Today's Visit #: 26 Starting Date: 11/08/21     ASSESSMENT AND PLAN:  Diet: Jessica Mendoza is currently in the action stage of change. As such, her goal is to continue with weight loss efforts. She has agreed to Category 1 Plan and keeping a food journal and adhering to recommended goals of 1000-1100 calories and 75+ grams of protein.  Exercise: Jessica Mendoza has been instructed to continue exercising as is for weight loss and overall health benefits.   Behavior Modification:  We discussed the following Behavioral Modification Strategies today: increasing lean protein intake, decreasing simple carbohydrates, increasing vegetables, increase H2O intake, increase high fiber foods, meal planning and cooking strategies, travel eating strategies, holiday eating strategies, avoiding temptations, planning for success, and keep a strict food journal. We discussed various  medication options to help Jessica Mendoza with her weight loss efforts and we both agreed to continue Mounjaro  7.5 mg weekly for T2 DM.  Return in about 5 weeks (around 02/05/2024).SABRA She was informed of the importance of frequent follow up visits to maximize her success with intensive lifestyle modifications for her multiple health conditions.  Attestation Statements:   Reviewed by clinician on day of visit: allergies, medications, problem list, medical history, surgical history, family history, social history, and previous encounter notes.   Time spent on visit including pre-visit chart review and post-visit care and charting was 43 minutes.    Debe Anfinson, PA-C

## 2024-01-02 ENCOUNTER — Ambulatory Visit (HOSPITAL_BASED_OUTPATIENT_CLINIC_OR_DEPARTMENT_OTHER): Payer: Self-pay | Admitting: Nurse Practitioner

## 2024-01-02 LAB — CMP14+EGFR
ALT: 18 IU/L (ref 0–32)
AST: 20 IU/L (ref 0–40)
Albumin: 4.7 g/dL (ref 3.9–4.9)
Alkaline Phosphatase: 106 IU/L (ref 49–135)
BUN/Creatinine Ratio: 11 — ABNORMAL LOW (ref 12–28)
BUN: 9 mg/dL (ref 8–27)
Bilirubin Total: 0.4 mg/dL (ref 0.0–1.2)
CO2: 22 mmol/L (ref 20–29)
Calcium: 9.6 mg/dL (ref 8.7–10.3)
Chloride: 99 mmol/L (ref 96–106)
Creatinine, Ser: 0.82 mg/dL (ref 0.57–1.00)
Globulin, Total: 3 g/dL (ref 1.5–4.5)
Glucose: 81 mg/dL (ref 70–99)
Potassium: 3.8 mmol/L (ref 3.5–5.2)
Sodium: 141 mmol/L (ref 134–144)
Total Protein: 7.7 g/dL (ref 6.0–8.5)
eGFR: 77 mL/min/1.73 (ref >59)

## 2024-01-02 LAB — LIPID PANEL WITH LDL/HDL RATIO
Cholesterol, Total: 152 mg/dL (ref 100–199)
HDL: 57 mg/dL (ref >39)
LDL Chol Calc (NIH): 75 mg/dL (ref 0–99)
LDL/HDL Ratio: 1.3 ratio (ref 0.0–3.2)
Triglycerides: 109 mg/dL (ref 0–149)
VLDL Cholesterol Cal: 20 mg/dL (ref 5–40)

## 2024-01-02 LAB — VITAMIN D 25 HYDROXY (VIT D DEFICIENCY, FRACTURES): Vit D, 25-Hydroxy: 42.2 ng/mL (ref 30.0–100.0)

## 2024-01-02 LAB — HEMOGLOBIN A1C
Est. average glucose Bld gHb Est-mCnc: 123 mg/dL
Hgb A1c MFr Bld: 5.9 % — ABNORMAL HIGH (ref 4.8–5.6)

## 2024-01-02 LAB — INSULIN, RANDOM: INSULIN: 22.3 u[IU]/mL (ref 2.6–24.9)

## 2024-01-02 LAB — VITAMIN B12: Vitamin B-12: 810 pg/mL (ref 232–1245)

## 2024-01-02 LAB — TSH: TSH: 1.13 u[IU]/mL (ref 0.450–4.500)

## 2024-02-04 NOTE — Progress Notes (Signed)
 "  SUBJECTIVE: Discussed the use of AI scribe software for clinical note transcription with the patient, who gave verbal consent to proceed.  Chief Complaint: Obesity  Interim History: She is down 2 lbs since her last visit.  Down 14 lbs overall  Jessica Mendoza is here to discuss her progress with her obesity treatment plan. She is on the Category 1 Plan and states she is following her eating plan approximately 25 % of the time. She states she is not exercising . Jessica Mendoza is a 68 year old female who presents for follow-up of her obesity treatment plan.  She has lost a total of 14 pounds, although she is only following the plan approximately 25% of the time. She is not eating more whole foods or getting the recommended amount of protein, but she is drinking adequate water and not skipping meals. She is not currently exercising.  She has type 2 diabetes and is currently on Mounjaro  7.5 mg weekly and metformin  500 mg twice daily. Her last A1c was 5.9, with a previous value of 5.4. Her insulin  level was 22.3, up from 19.9.  She has hyperlipidemia and is on pitavastatin  2 mg daily. She also has hypertension and cardiomyopathy, for which she takes amlodipine  5 mg daily and metoprolol  50 mg daily. She is anticoagulated on Xarelto  20 mg daily.  She is being treated for vitamin D  deficiency with ergocalciferol  50,000 units every 14 days. She also has acquired hypothyroidism.  She is not sleeping at least seven hours nightly and tends to stay up late. She has been working on establishing a better sleep routine, aiming to be in bed by 9 PM. She occasionally achieves almost eight hours of sleep with two hours of REM sleep.  She recently experienced significant family stress due to the passing of her brother, who suffered two strokes following surgery. This has impacted her ability to follow her health plan consistently.  OBJECTIVE: Visit Diagnoses: Problem List Items Addressed This Visit      Diabetes mellitus, type 2 (HCC)   Generalized obesity   Vitamin D  deficiency   Other Visit Diagnoses       Grief    -  Primary     BMI 34.0-34.9,adult Current BMI 34.0         Obesity Management is ongoing with a focus on dietary adherence and weight loss. She has lost 14 pounds with a total weight loss of 14 pounds, but adherence to the plan is only 25%. Challenges include not consuming enough whole foods and protein, though hydration is adequate. Exercise is not currently part of her routine. - Encouraged increased protein intake to 30 grams three times a day. - Advised to continue using [employer] drinks for protein supplementation. - Encouraged planning meals to ensure adequate protein intake. - Discussed the importance of maintaining hydration.   Grief Discussed managing grief and stress following the recent loss of her brother. She is considering grief counseling through employee assistance programs. Sleep patterns are variable, with efforts to improve sleep hygiene. - Encouraged participation in grief counseling or support groups. - Advised on sleep hygiene practices, including setting a consistent bedtime routine and reducing screen time before bed.   Type 2 diabetes mellitus complicated by hypertension and hyperlipidemia Type 2 diabetes is managed with Mounjaro  7.5 mg weekly and metformin  500 mg twice daily. Recent A1c increased from 5.7% to 5.9%, indicating a slight worsening but still improved from previous levels. Insulin  levels have also increased slightly, suggesting increased  insulin  resistance. Hypertension is managed with amlodipine  and metoprolol . Hyperlipidemia is managed with pitavastatin . Recent CT calcium  scoring was zero, indicating no significant cholesterol plaque buildup. Mild aortic changes noted but not concerning for her age. May want to consider increasing Mounjaro  at next visit, as has been on current dose since 05/2022, but needs consistency with plan which has  been difficult over the past month given the recent family events.  Lab Results  Component Value Date   HGBA1C 5.9 (H) 01/01/2024   HGBA1C 5.7 (H) 07/31/2023   HGBA1C 5.8 (H) 08/29/2022   Lab Results  Component Value Date   MICROALBUR 0.50 04/07/2013   LDLCALC 75 01/01/2024   CREATININE 0.82 01/01/2024   She is working  on nutrition plan to decrease simple carbohydrates, increase lean proteins and exercise to promote weight loss and improve glycemic control . Plan: - Continue Mounjaro  7.5 mg weekly and metformin  500 mg twice daily. - Continue amlodipine  5 mg daily and metoprolol  50 mg daily. - Continue pitavastatin  2 mg daily. No refills needed this visit has prescribed x 90 days - Monitor A1c and insulin  levels regularly. - Encouraged dietary adherence to manage blood sugar levels.   Hyperlipidemia LDL is not at goal. Medication(s): Pitavastatin  2 mg daily. No SE. Has had SE with other statins. Cardiovascular risk factors: advanced age (older than 70 for men, 34 for women), diabetes mellitus, dyslipidemia, hypertension, obesity (BMI >= 30 kg/m2), and sedentary lifestyle  Coronary Calcium  Score 01/01/2024 - 0  Lab Results  Component Value Date   CHOL 152 01/01/2024   HDL 57 01/01/2024   LDLCALC 75 01/01/2024   TRIG 109 01/01/2024   CHOLHDL 2.8 12/13/2021   CHOLHDL 2.5 05/31/2021   CHOLHDL 2.9 04/07/2013   Lab Results  Component Value Date   ALT 18 01/01/2024   AST 20 01/01/2024   ALKPHOS 106 01/01/2024   BILITOT 0.4 01/01/2024   The 10-year ASCVD risk score (Arnett DK, et al., 2019) is: 21.5%   Values used to calculate the score:     Age: 69 years     Clinically relevant sex: Female     Is Non-Hispanic African American: Yes     Diabetic: Yes     Tobacco smoker: No     Systolic Blood Pressure: 133 mmHg     Is BP treated: Yes     HDL Cholesterol: 57 mg/dL     Total Cholesterol: 152 mg/dL  Plan: Continue statin. Continue Mounjaro  to also lower CV risks CCS  score of 0 is reassuring . Continue to work on nutrition plan -decreasing simple carbohydrates, increasing lean proteins, decreasing saturated fats and cholesterol , avoiding trans fats and exercise as able to promote weight loss, improve lipids and decrease cardiovascular risks.  Vitamin D  deficiency Managed with ergocalciferol  50,000 units every 14 days. No current issues reported with vitamin D  supplementation. Last vitamin D  Lab Results  Component Value Date   VD25OH 42.2 01/01/2024   Low vitamin D  levels can be associated with adiposity and may result in leptin resistance and weight gain. Also associated with fatigue.  Currently on vitamin D  supplementation without any adverse effects such as nausea, vomiting or muscle weakness.  - Continue ergocalciferol  50,000 units every 14 days.- no refill needed    Vitals Temp: 98 F (36.7 C) BP: 133/76 Pulse Rate: 72 SpO2: 97 %   Anthropometric Measurements Height: 5' 5 (1.651 m) Weight: 204 lb (92.5 kg) BMI (Calculated): 33.95 Weight at Last Visit: 202 lb Weight  Lost Since Last Visit: 2 lb Weight Gained Since Last Visit: 0 Starting Weight: 214 lb Total Weight Loss (lbs): 14 lb (6.35 kg)   Body Composition  Body Fat %: 45 % Fat Mass (lbs): 92 lbs Muscle Mass (lbs): 106.8 lbs Total Body Water (lbs): 81.6 lbs Visceral Fat Rating : 14   Other Clinical Data Fasting: No Labs: No Today's Visit #: 27 Starting Date: 11/08/21     ASSESSMENT AND PLAN:  Diet: Jessica Mendoza is currently in the action stage of change. As such, her goal is to continue with weight loss efforts. She has agreed to Category 1 Plan.  Exercise: Jessica Mendoza has been instructed to work up to a goal of 150 minutes of combined cardio and strengthening exercise per week, to try a geriatric exercise plan, and that some exercise is better than none for weight loss and overall health benefits.   Behavior Modification:  We discussed the following Behavioral  Modification Strategies today: increasing lean protein intake, decreasing simple carbohydrates, increasing vegetables, increase H2O intake, increase high fiber foods, meal planning and cooking strategies, holiday eating strategies, avoiding temptations, and planning for success. We discussed various medication options to help Jessica Mendoza with her weight loss efforts and we both agreed to continue Mounjaro  7.5 mg weekly and metformin  500 mg BID for T2DM and medical weight loss.  Return in about 4 weeks (around 03/04/2024).SABRA She was informed of the importance of frequent follow up visits to maximize her success with intensive lifestyle modifications for her multiple health conditions.  Attestation Statements:   Reviewed by clinician on day of visit: allergies, medications, problem list, medical history, surgical history, family history, social history, and previous encounter notes.   Time spent on visit including pre-visit chart review and post-visit care and charting was 35 minutes.    Paul Trettin, PA-C  "

## 2024-02-05 ENCOUNTER — Encounter (INDEPENDENT_AMBULATORY_CARE_PROVIDER_SITE_OTHER): Payer: Self-pay | Admitting: Physician Assistant

## 2024-02-05 ENCOUNTER — Ambulatory Visit (INDEPENDENT_AMBULATORY_CARE_PROVIDER_SITE_OTHER): Admitting: Physician Assistant

## 2024-02-05 VITALS — BP 133/76 | HR 72 | Temp 98.0°F | Ht 65.0 in | Wt 204.0 lb

## 2024-02-05 DIAGNOSIS — E559 Vitamin D deficiency, unspecified: Secondary | ICD-10-CM | POA: Diagnosis not present

## 2024-02-05 DIAGNOSIS — E1169 Type 2 diabetes mellitus with other specified complication: Secondary | ICD-10-CM

## 2024-02-05 DIAGNOSIS — E1165 Type 2 diabetes mellitus with hyperglycemia: Secondary | ICD-10-CM

## 2024-02-05 DIAGNOSIS — Z7985 Long-term (current) use of injectable non-insulin antidiabetic drugs: Secondary | ICD-10-CM | POA: Diagnosis not present

## 2024-02-05 DIAGNOSIS — Z7984 Long term (current) use of oral hypoglycemic drugs: Secondary | ICD-10-CM

## 2024-02-05 DIAGNOSIS — E785 Hyperlipidemia, unspecified: Secondary | ICD-10-CM | POA: Diagnosis not present

## 2024-02-05 DIAGNOSIS — I10 Essential (primary) hypertension: Secondary | ICD-10-CM | POA: Diagnosis not present

## 2024-02-05 DIAGNOSIS — Z6834 Body mass index (BMI) 34.0-34.9, adult: Secondary | ICD-10-CM

## 2024-02-05 DIAGNOSIS — E669 Obesity, unspecified: Secondary | ICD-10-CM

## 2024-02-05 DIAGNOSIS — E1159 Type 2 diabetes mellitus with other circulatory complications: Secondary | ICD-10-CM

## 2024-02-05 DIAGNOSIS — I152 Hypertension secondary to endocrine disorders: Secondary | ICD-10-CM

## 2024-02-05 DIAGNOSIS — E039 Hypothyroidism, unspecified: Secondary | ICD-10-CM

## 2024-02-05 DIAGNOSIS — F4321 Adjustment disorder with depressed mood: Secondary | ICD-10-CM | POA: Diagnosis not present

## 2024-03-11 ENCOUNTER — Ambulatory Visit (INDEPENDENT_AMBULATORY_CARE_PROVIDER_SITE_OTHER): Admitting: Family Medicine

## 2024-03-16 ENCOUNTER — Other Ambulatory Visit (INDEPENDENT_AMBULATORY_CARE_PROVIDER_SITE_OTHER): Payer: Self-pay | Admitting: Physician Assistant

## 2024-03-16 DIAGNOSIS — E559 Vitamin D deficiency, unspecified: Secondary | ICD-10-CM

## 2024-03-18 ENCOUNTER — Ambulatory Visit (INDEPENDENT_AMBULATORY_CARE_PROVIDER_SITE_OTHER): Admitting: Family Medicine

## 2024-03-18 ENCOUNTER — Encounter (INDEPENDENT_AMBULATORY_CARE_PROVIDER_SITE_OTHER): Payer: Self-pay | Admitting: Family Medicine

## 2024-03-18 VITALS — BP 122/72 | HR 80 | Temp 98.0°F | Ht 65.0 in | Wt 200.0 lb

## 2024-03-18 DIAGNOSIS — E669 Obesity, unspecified: Secondary | ICD-10-CM

## 2024-03-18 DIAGNOSIS — E1165 Type 2 diabetes mellitus with hyperglycemia: Secondary | ICD-10-CM

## 2024-03-18 DIAGNOSIS — Z6833 Body mass index (BMI) 33.0-33.9, adult: Secondary | ICD-10-CM

## 2024-03-18 DIAGNOSIS — E559 Vitamin D deficiency, unspecified: Secondary | ICD-10-CM

## 2024-03-18 MED ORDER — VITAMIN D (ERGOCALCIFEROL) 1.25 MG (50000 UNIT) PO CAPS: 50000.0000 [IU] | ORAL_CAPSULE | ORAL | 0 refills | Status: AC

## 2024-03-18 MED ORDER — TIRZEPATIDE 7.5 MG/0.5ML ~~LOC~~ SOAJ: 7.5000 mg | SUBCUTANEOUS | 0 refills | Status: AC

## 2024-03-18 NOTE — Progress Notes (Unsigned)
" ° °  SUBJECTIVE:  Chief Complaint: Obesity  Interim History: Patient had a very challenging holiday season- her brother died.  She was going back and forth to [place] to see her brother before he died and then again for his service.  Holidays were different and limbo.  She is in grief counseling currently.  Patient feels like her body is starting to have a variety of aches and pains that she wasn't having before.  She has increased her juicing recently with fruits and vegetables.  She isn't consistently eating three meals a day.  She is trying to use one meal as a juice.  She is using the owyn protein shakes as a supplement.   Jessica Mendoza is here to discuss her progress with her obesity treatment plan. She is on the Category 1 Plan and states she is following her eating plan approximately 70 % of the time. She states she is exercising 15 minutes 2 times per week.   OBJECTIVE: Visit Diagnoses: Problem List Items Addressed This Visit   None   Vitals Temp: 98 F (36.7 C) BP: 122/72 Pulse Rate: 80 SpO2: 98 %   Anthropometric Measurements Height: 5' 5 (1.651 m) Weight: 200 lb (90.7 kg) BMI (Calculated): 33.28 Weight at Last Visit: 204 lb Weight Lost Since Last Visit: 4 Weight Gained Since Last Visit: 0 Starting Weight: 214 lb Total Weight Loss (lbs): 14 lb (6.35 kg)   Body Composition  Body Fat %: 44 % Fat Mass (lbs): 88 lbs Muscle Mass (lbs): 106.4 lbs Total Body Water (lbs): 79.6 lbs Visceral Fat Rating : 13   Other Clinical Data Fasting: no Labs: no Today's Visit #: 28 Starting Date: 11/08/21 Comments: Cat 1     ASSESSMENT AND PLAN: Assessment & Plan Type 2 diabetes mellitus with hyperglycemia, unspecified whether long term insulin  use (HCC)  Vitamin D  deficiency  BMI 33.0-33.9,adult Current BMI 33.6  Obesity: Starting BMI 35    Diet: Jessica Mendoza is currently in the action stage of change. As such, her goal is to continue with weight loss efforts and has agreed to the  Category 1 Plan with 300-350 calories and 25 or more grams of protein for lunch.  Exercise:  Older adults should determine their level of effort for physical activity relative to their level of fitness.  Behavior Modification:  We discussed the following Behavioral Modification Strategies today: increasing lean protein intake, decreasing simple carbohydrates, increasing vegetables, no skipping meals, meal planning and cooking strategies, keeping healthy foods in the home, and planning for success.   No follow-ups on file.   She was informed of the importance of frequent follow up visits to maximize her success with intensive lifestyle modifications for her multiple health conditions.  Attestation Statements:   Reviewed by clinician on day of visit: allergies, medications, problem list, medical history, surgical history, family history, social history, and previous encounter notes.     Adelita Cho, MD "

## 2024-04-29 ENCOUNTER — Ambulatory Visit (INDEPENDENT_AMBULATORY_CARE_PROVIDER_SITE_OTHER): Admitting: Family Medicine

## 2024-06-24 ENCOUNTER — Inpatient Hospital Stay: Admitting: Hematology and Oncology

## 2024-07-01 ENCOUNTER — Ambulatory Visit: Admitting: Podiatry
